# Patient Record
Sex: Male | Born: 1935
Health system: Southern US, Community
[De-identification: ages and names within clinical notes are randomized; demographics above are authoritative.]

## PROBLEM LIST (undated history)

## (undated) DIAGNOSIS — I714 Abdominal aortic aneurysm, without rupture, unspecified: Secondary | ICD-10-CM

## (undated) DIAGNOSIS — G629 Polyneuropathy, unspecified: Secondary | ICD-10-CM

## (undated) DIAGNOSIS — C159 Malignant neoplasm of esophagus, unspecified: Secondary | ICD-10-CM

## (undated) DIAGNOSIS — D509 Iron deficiency anemia, unspecified: Secondary | ICD-10-CM

## (undated) DIAGNOSIS — I2510 Atherosclerotic heart disease of native coronary artery without angina pectoris: Secondary | ICD-10-CM

## (undated) DIAGNOSIS — K635 Polyp of colon: Secondary | ICD-10-CM

## (undated) DIAGNOSIS — E785 Hyperlipidemia, unspecified: Secondary | ICD-10-CM

## (undated) DIAGNOSIS — K219 Gastro-esophageal reflux disease without esophagitis: Secondary | ICD-10-CM

## (undated) DIAGNOSIS — K2270 Barrett's esophagus without dysplasia: Secondary | ICD-10-CM

## (undated) DIAGNOSIS — E669 Obesity, unspecified: Secondary | ICD-10-CM

## (undated) DIAGNOSIS — N189 Chronic kidney disease, unspecified: Secondary | ICD-10-CM

## (undated) DIAGNOSIS — I4892 Unspecified atrial flutter: Secondary | ICD-10-CM

## (undated) DIAGNOSIS — I10 Essential (primary) hypertension: Secondary | ICD-10-CM

## (undated) DIAGNOSIS — H269 Unspecified cataract: Secondary | ICD-10-CM

## (undated) DIAGNOSIS — N400 Enlarged prostate without lower urinary tract symptoms: Secondary | ICD-10-CM

## (undated) HISTORY — PX: CATARACT EXTRACTION: SUR2

## (undated) HISTORY — PX: ARTERIAL ANEURYSM REPAIR: SHX556

## (undated) HISTORY — PX: OTHER SURGICAL HISTORY: SHX169

## (undated) HISTORY — DX: Chronic kidney disease, unspecified: N18.9

## (undated) HISTORY — DX: Unspecified atrial flutter: I48.92

## (undated) HISTORY — DX: Malignant neoplasm of esophagus, unspecified: C15.9

## (undated) HISTORY — DX: Polyneuropathy, unspecified: G62.9

## (undated) HISTORY — DX: Polyp of colon: K63.5

## (undated) HISTORY — DX: Iron deficiency anemia, unspecified: D50.9

## (undated) HISTORY — DX: Atherosclerotic heart disease of native coronary artery without angina pectoris: I25.10

## (undated) HISTORY — DX: Unspecified cataract: H26.9

## (undated) HISTORY — DX: Abdominal aortic aneurysm, without rupture, unspecified: I71.40

## (undated) HISTORY — DX: Abdominal aortic aneurysm, without rupture: I71.4

## (undated) HISTORY — DX: Hyperlipidemia, unspecified: E78.5

## (undated) HISTORY — DX: Gastro-esophageal reflux disease without esophagitis: K21.9

## (undated) HISTORY — DX: Benign prostatic hyperplasia without lower urinary tract symptoms: N40.0

## (undated) HISTORY — DX: Essential (primary) hypertension: I10

## (undated) HISTORY — DX: Obesity, unspecified: E66.9

## (undated) HISTORY — DX: Barrett's esophagus without dysplasia: K22.70

---

## 1978-05-25 HISTORY — PX: LUMBAR DISC ARTHROPLASTY: SHX699

## 1997-12-06 ENCOUNTER — Other Ambulatory Visit: Admission: RE | Admit: 1997-12-06 | Discharge: 1997-12-06 | Payer: Self-pay | Admitting: Urology

## 1998-05-25 HISTORY — PX: ESOPHAGECTOMY: SUR457

## 1998-05-25 HISTORY — PX: CORONARY ARTERY BYPASS GRAFT: SHX141

## 1998-06-24 ENCOUNTER — Other Ambulatory Visit: Admission: RE | Admit: 1998-06-24 | Discharge: 1998-06-24 | Payer: Self-pay | Admitting: Internal Medicine

## 1998-07-08 ENCOUNTER — Encounter: Admission: RE | Admit: 1998-07-08 | Discharge: 1998-10-06 | Payer: Self-pay | Admitting: Endocrinology

## 1998-08-02 ENCOUNTER — Encounter: Payer: Self-pay | Admitting: *Deleted

## 1998-08-03 ENCOUNTER — Inpatient Hospital Stay (HOSPITAL_COMMUNITY): Admission: AD | Admit: 1998-08-03 | Discharge: 1998-08-09 | Payer: Self-pay | Admitting: *Deleted

## 1998-08-07 ENCOUNTER — Encounter: Payer: Self-pay | Admitting: Thoracic Surgery (Cardiothoracic Vascular Surgery)

## 1998-08-08 ENCOUNTER — Encounter: Payer: Self-pay | Admitting: Thoracic Surgery (Cardiothoracic Vascular Surgery)

## 1998-09-04 ENCOUNTER — Ambulatory Visit: Admission: RE | Admit: 1998-09-04 | Discharge: 1998-09-04 | Payer: Self-pay | Admitting: Vascular Surgery

## 1998-09-09 ENCOUNTER — Encounter: Payer: Self-pay | Admitting: Vascular Surgery

## 1998-09-09 ENCOUNTER — Ambulatory Visit (HOSPITAL_COMMUNITY): Admission: RE | Admit: 1998-09-09 | Discharge: 1998-09-09 | Payer: Self-pay | Admitting: Vascular Surgery

## 1998-09-30 ENCOUNTER — Inpatient Hospital Stay: Admission: RE | Admit: 1998-09-30 | Discharge: 1998-10-04 | Payer: Self-pay | Admitting: Vascular Surgery

## 1998-09-30 ENCOUNTER — Encounter: Payer: Self-pay | Admitting: Vascular Surgery

## 1998-10-01 ENCOUNTER — Encounter: Payer: Self-pay | Admitting: Vascular Surgery

## 1998-10-05 ENCOUNTER — Emergency Department (HOSPITAL_COMMUNITY): Admission: EM | Admit: 1998-10-05 | Discharge: 1998-10-05 | Payer: Self-pay | Admitting: Emergency Medicine

## 1998-10-06 ENCOUNTER — Inpatient Hospital Stay (HOSPITAL_COMMUNITY): Admission: EM | Admit: 1998-10-06 | Discharge: 1998-10-08 | Payer: Self-pay | Admitting: Emergency Medicine

## 1998-10-22 ENCOUNTER — Encounter: Payer: Self-pay | Admitting: Vascular Surgery

## 1998-10-22 ENCOUNTER — Inpatient Hospital Stay (HOSPITAL_COMMUNITY): Admission: EM | Admit: 1998-10-22 | Discharge: 1998-10-28 | Payer: Self-pay | Admitting: Vascular Surgery

## 1998-10-25 ENCOUNTER — Encounter: Payer: Self-pay | Admitting: Vascular Surgery

## 1999-01-06 ENCOUNTER — Inpatient Hospital Stay (HOSPITAL_COMMUNITY): Admission: RE | Admit: 1999-01-06 | Discharge: 1999-01-15 | Payer: Self-pay | Admitting: Cardiothoracic Surgery

## 1999-01-06 ENCOUNTER — Encounter: Payer: Self-pay | Admitting: Cardiothoracic Surgery

## 1999-01-07 ENCOUNTER — Encounter: Payer: Self-pay | Admitting: Cardiothoracic Surgery

## 1999-01-08 ENCOUNTER — Encounter: Payer: Self-pay | Admitting: Cardiothoracic Surgery

## 1999-01-10 ENCOUNTER — Encounter: Payer: Self-pay | Admitting: Cardiothoracic Surgery

## 1999-01-13 ENCOUNTER — Encounter: Payer: Self-pay | Admitting: Cardiothoracic Surgery

## 1999-02-25 ENCOUNTER — Encounter (HOSPITAL_COMMUNITY): Admission: RE | Admit: 1999-02-25 | Discharge: 1999-05-26 | Payer: Self-pay | Admitting: *Deleted

## 1999-05-26 HISTORY — PX: VENTRAL HERNIA REPAIR: SHX424

## 1999-05-27 ENCOUNTER — Encounter (HOSPITAL_COMMUNITY): Admission: RE | Admit: 1999-05-27 | Discharge: 1999-06-04 | Payer: Self-pay | Admitting: *Deleted

## 2000-04-01 ENCOUNTER — Encounter: Payer: Self-pay | Admitting: Cardiothoracic Surgery

## 2000-04-01 ENCOUNTER — Encounter: Admission: RE | Admit: 2000-04-01 | Discharge: 2000-04-01 | Payer: Self-pay | Admitting: Cardiothoracic Surgery

## 2000-05-04 ENCOUNTER — Encounter: Payer: Self-pay | Admitting: General Surgery

## 2000-05-08 ENCOUNTER — Inpatient Hospital Stay (HOSPITAL_COMMUNITY): Admission: RE | Admit: 2000-05-08 | Discharge: 2000-05-09 | Payer: Self-pay | Admitting: General Surgery

## 2004-04-03 ENCOUNTER — Ambulatory Visit: Payer: Self-pay | Admitting: Endocrinology

## 2004-12-29 ENCOUNTER — Ambulatory Visit: Payer: Self-pay | Admitting: Endocrinology

## 2005-01-09 ENCOUNTER — Ambulatory Visit: Payer: Self-pay | Admitting: Endocrinology

## 2005-03-05 ENCOUNTER — Ambulatory Visit: Payer: Self-pay | Admitting: Endocrinology

## 2005-11-09 ENCOUNTER — Ambulatory Visit: Payer: Self-pay | Admitting: Endocrinology

## 2005-11-16 ENCOUNTER — Ambulatory Visit: Payer: Self-pay

## 2005-11-30 ENCOUNTER — Encounter: Payer: Self-pay | Admitting: Cardiology

## 2005-11-30 ENCOUNTER — Ambulatory Visit: Payer: Self-pay

## 2005-12-01 ENCOUNTER — Ambulatory Visit: Payer: Self-pay | Admitting: Cardiology

## 2005-12-16 ENCOUNTER — Ambulatory Visit: Payer: Self-pay

## 2005-12-23 ENCOUNTER — Ambulatory Visit: Payer: Self-pay | Admitting: Endocrinology

## 2006-01-11 ENCOUNTER — Ambulatory Visit: Payer: Self-pay | Admitting: Endocrinology

## 2006-03-10 ENCOUNTER — Ambulatory Visit: Payer: Self-pay | Admitting: Endocrinology

## 2006-06-01 ENCOUNTER — Ambulatory Visit: Payer: Self-pay | Admitting: Cardiology

## 2006-08-18 ENCOUNTER — Ambulatory Visit: Payer: Self-pay | Admitting: Endocrinology

## 2006-09-09 ENCOUNTER — Ambulatory Visit (HOSPITAL_COMMUNITY): Admission: RE | Admit: 2006-09-09 | Discharge: 2006-09-09 | Payer: Self-pay | Admitting: Endocrinology

## 2006-11-03 ENCOUNTER — Ambulatory Visit: Payer: Self-pay | Admitting: Cardiology

## 2006-11-30 ENCOUNTER — Ambulatory Visit: Payer: Self-pay

## 2006-11-30 LAB — CONVERTED CEMR LAB
BUN: 10 mg/dL (ref 6–23)
Calcium: 9.4 mg/dL (ref 8.4–10.5)
GFR calc Af Amer: 107 mL/min
GFR calc non Af Amer: 89 mL/min
Potassium: 4.2 meq/L (ref 3.5–5.1)

## 2006-12-24 ENCOUNTER — Encounter: Payer: Self-pay | Admitting: Endocrinology

## 2006-12-24 DIAGNOSIS — I10 Essential (primary) hypertension: Secondary | ICD-10-CM | POA: Insufficient documentation

## 2006-12-24 DIAGNOSIS — E119 Type 2 diabetes mellitus without complications: Secondary | ICD-10-CM | POA: Insufficient documentation

## 2007-01-05 ENCOUNTER — Ambulatory Visit: Payer: Self-pay | Admitting: Endocrinology

## 2007-01-05 LAB — CONVERTED CEMR LAB
Bilirubin Urine: NEGATIVE
Bilirubin, Direct: 0.1 mg/dL (ref 0.0–0.3)
CO2: 32 meq/L (ref 19–32)
Creatinine, Ser: 1 mg/dL (ref 0.4–1.5)
Eosinophils Absolute: 0.2 10*3/uL (ref 0.0–0.6)
Eosinophils Relative: 3.5 % (ref 0.0–5.0)
GFR calc Af Amer: 95 mL/min
Glucose, Bld: 120 mg/dL — ABNORMAL HIGH (ref 70–99)
HDL: 43.5 mg/dL (ref >39.0)
Hemoglobin, Urine: NEGATIVE
Hemoglobin: 13.7 g/dL (ref 13.0–17.0)
Ketones, ur: NEGATIVE mg/dL
Lymphocytes Relative: 32.7 % (ref 12.0–46.0)
MCV: 89.8 fL (ref 78.0–100.0)
Microalb Creat Ratio: 2.2 mg/g (ref 0.0–30.0)
Microalb, Ur: 0.4 mg/dL (ref 0.0–1.9)
Monocytes Absolute: 0.5 10*3/uL (ref 0.2–0.7)
Monocytes Relative: 11.5 % — ABNORMAL HIGH (ref 3.0–11.0)
Neutro Abs: 2.3 10*3/uL (ref 1.4–7.7)
Nitrite: NEGATIVE
Platelets: 196 10*3/uL (ref 150–400)
Potassium: 4.9 meq/L (ref 3.5–5.1)
Sodium: 141 meq/L (ref 135–145)
Testosterone: 308.88 ng/dL — ABNORMAL LOW (ref 350.00–890)
Total Bilirubin: 0.7 mg/dL (ref 0.3–1.2)
Total Protein: 6.7 g/dL (ref 6.0–8.3)
Triglycerides: 55 mg/dL (ref 0–149)
Urine Glucose: NEGATIVE mg/dL
Urobilinogen, UA: 0.2 (ref 0.0–1.0)
WBC: 4.4 10*3/uL — ABNORMAL LOW (ref 4.5–10.5)

## 2007-01-13 ENCOUNTER — Ambulatory Visit: Payer: Self-pay | Admitting: Endocrinology

## 2007-02-21 ENCOUNTER — Ambulatory Visit: Payer: Self-pay | Admitting: Endocrinology

## 2007-04-19 ENCOUNTER — Ambulatory Visit: Payer: Self-pay | Admitting: Endocrinology

## 2007-04-19 DIAGNOSIS — N3000 Acute cystitis without hematuria: Secondary | ICD-10-CM | POA: Insufficient documentation

## 2007-04-25 ENCOUNTER — Telehealth: Payer: Self-pay | Admitting: Endocrinology

## 2007-04-25 LAB — CONVERTED CEMR LAB
ALT: 22 units/L (ref 0–53)
AST: 22 units/L (ref 0–37)
Bacteria, UA: NEGATIVE
Bilirubin Urine: NEGATIVE
Bilirubin, Direct: 0.1 mg/dL (ref 0.0–0.3)
Crystals: NEGATIVE
HDL: 45.5 mg/dL (ref >39.0)
Hemoglobin, Urine: NEGATIVE
Hgb A1c MFr Bld: 6.3 % — ABNORMAL HIGH (ref 4.6–6.0)
Nitrite: NEGATIVE
Total CHOL/HDL Ratio: 2.8
Total Protein, Urine: NEGATIVE mg/dL
Total Protein: 6.6 g/dL (ref 6.0–8.3)
Triglycerides: 73 mg/dL (ref 0–149)
Urine Glucose: NEGATIVE mg/dL
Urobilinogen, UA: 0.2 (ref 0.0–1.0)
VLDL: 15 mg/dL (ref 0–40)

## 2007-04-26 ENCOUNTER — Ambulatory Visit: Payer: Self-pay | Admitting: Endocrinology

## 2007-04-26 DIAGNOSIS — E299 Testicular dysfunction, unspecified: Secondary | ICD-10-CM | POA: Insufficient documentation

## 2007-04-29 ENCOUNTER — Telehealth (INDEPENDENT_AMBULATORY_CARE_PROVIDER_SITE_OTHER): Payer: Self-pay | Admitting: *Deleted

## 2007-08-23 ENCOUNTER — Ambulatory Visit: Payer: Self-pay | Admitting: Endocrinology

## 2007-08-23 ENCOUNTER — Encounter (INDEPENDENT_AMBULATORY_CARE_PROVIDER_SITE_OTHER): Payer: Self-pay | Admitting: *Deleted

## 2007-08-23 DIAGNOSIS — R1319 Other dysphagia: Secondary | ICD-10-CM | POA: Insufficient documentation

## 2007-08-23 DIAGNOSIS — R0602 Shortness of breath: Secondary | ICD-10-CM | POA: Insufficient documentation

## 2007-08-30 ENCOUNTER — Ambulatory Visit: Payer: Self-pay | Admitting: Internal Medicine

## 2007-09-20 ENCOUNTER — Ambulatory Visit: Payer: Self-pay | Admitting: Internal Medicine

## 2007-10-25 ENCOUNTER — Ambulatory Visit: Payer: Self-pay | Admitting: Cardiology

## 2007-12-26 ENCOUNTER — Telehealth: Payer: Self-pay | Admitting: Endocrinology

## 2008-01-09 ENCOUNTER — Ambulatory Visit: Payer: Self-pay | Admitting: Endocrinology

## 2008-01-09 LAB — CONVERTED CEMR LAB
Basophils Absolute: 0 10*3/uL (ref 0.0–0.1)
Bilirubin Urine: NEGATIVE
Bilirubin, Direct: 0.2 mg/dL (ref 0.0–0.3)
CO2: 30 meq/L (ref 19–32)
Calcium: 8.9 mg/dL (ref 8.4–10.5)
Crystals: NEGATIVE
GFR calc Af Amer: 95 mL/min
HDL: 42.2 mg/dL (ref >39.0)
Hemoglobin: 12.4 g/dL — ABNORMAL LOW (ref 13.0–17.0)
Ketones, ur: NEGATIVE mg/dL
LDL Cholesterol: 71 mg/dL (ref 0–99)
Lymphocytes Relative: 28.4 % (ref 12.0–46.0)
MCHC: 33.8 g/dL (ref 30.0–36.0)
Neutro Abs: 2.3 10*3/uL (ref 1.4–7.7)
Platelets: 176 10*3/uL (ref 150–400)
RDW: 15 % — ABNORMAL HIGH (ref 11.5–14.6)
Sodium: 142 meq/L (ref 135–145)
TSH: 2.2 microintl units/mL (ref 0.35–5.50)
Total Bilirubin: 0.8 mg/dL (ref 0.3–1.2)
Total CHOL/HDL Ratio: 3.1
Triglycerides: 83 mg/dL (ref 0–149)
Urobilinogen, UA: 0.2 (ref 0.0–1.0)
VLDL: 17 mg/dL (ref 0–40)
pH: 7 (ref 5.0–8.0)

## 2008-01-16 ENCOUNTER — Telehealth (INDEPENDENT_AMBULATORY_CARE_PROVIDER_SITE_OTHER): Payer: Self-pay | Admitting: *Deleted

## 2008-01-16 ENCOUNTER — Ambulatory Visit: Payer: Self-pay | Admitting: Endocrinology

## 2008-01-16 DIAGNOSIS — D509 Iron deficiency anemia, unspecified: Secondary | ICD-10-CM | POA: Insufficient documentation

## 2008-01-18 LAB — CONVERTED CEMR LAB
Eosinophils Relative: 1.9 % (ref 0.0–5.0)
HCT: 37.3 % — ABNORMAL LOW (ref 39.0–52.0)
Hemoglobin: 12.5 g/dL — ABNORMAL LOW (ref 13.0–17.0)
Hgb A1c MFr Bld: 6.3 % — ABNORMAL HIGH (ref 4.6–6.0)
Monocytes Absolute: 0.5 10*3/uL (ref 0.1–1.0)
Monocytes Relative: 10.9 % (ref 3.0–12.0)
Neutro Abs: 2.8 10*3/uL (ref 1.4–7.7)
Saturation Ratios: 9.1 % — ABNORMAL LOW (ref 20.0–50.0)
WBC: 4.9 10*3/uL (ref 4.5–10.5)

## 2008-02-20 ENCOUNTER — Telehealth (INDEPENDENT_AMBULATORY_CARE_PROVIDER_SITE_OTHER): Payer: Self-pay | Admitting: *Deleted

## 2008-02-21 ENCOUNTER — Ambulatory Visit: Payer: Self-pay | Admitting: Endocrinology

## 2008-05-01 ENCOUNTER — Ambulatory Visit: Payer: Self-pay | Admitting: Endocrinology

## 2008-05-24 ENCOUNTER — Telehealth (INDEPENDENT_AMBULATORY_CARE_PROVIDER_SITE_OTHER): Payer: Self-pay | Admitting: *Deleted

## 2008-08-31 ENCOUNTER — Telehealth (INDEPENDENT_AMBULATORY_CARE_PROVIDER_SITE_OTHER): Payer: Self-pay | Admitting: *Deleted

## 2008-09-06 ENCOUNTER — Ambulatory Visit: Payer: Self-pay | Admitting: Endocrinology

## 2008-09-06 ENCOUNTER — Encounter (INDEPENDENT_AMBULATORY_CARE_PROVIDER_SITE_OTHER): Payer: Self-pay | Admitting: Surgery

## 2008-09-06 ENCOUNTER — Ambulatory Visit: Payer: Self-pay | Admitting: Internal Medicine

## 2008-09-06 ENCOUNTER — Inpatient Hospital Stay (HOSPITAL_COMMUNITY): Admission: AD | Admit: 2008-09-06 | Discharge: 2008-09-08 | Payer: Self-pay | Admitting: Endocrinology

## 2008-09-06 DIAGNOSIS — L03319 Cellulitis of trunk, unspecified: Secondary | ICD-10-CM

## 2008-09-06 DIAGNOSIS — L02219 Cutaneous abscess of trunk, unspecified: Secondary | ICD-10-CM | POA: Insufficient documentation

## 2008-09-10 ENCOUNTER — Telehealth: Payer: Self-pay | Admitting: Endocrinology

## 2008-09-11 ENCOUNTER — Encounter: Payer: Self-pay | Admitting: Endocrinology

## 2008-09-18 ENCOUNTER — Ambulatory Visit: Payer: Self-pay | Admitting: Endocrinology

## 2008-09-19 ENCOUNTER — Telehealth: Payer: Self-pay | Admitting: Endocrinology

## 2008-09-25 ENCOUNTER — Encounter: Payer: Self-pay | Admitting: Endocrinology

## 2008-10-01 ENCOUNTER — Ambulatory Visit: Payer: Self-pay | Admitting: Endocrinology

## 2008-10-17 ENCOUNTER — Ambulatory Visit: Payer: Self-pay | Admitting: Internal Medicine

## 2008-10-17 DIAGNOSIS — R21 Rash and other nonspecific skin eruption: Secondary | ICD-10-CM | POA: Insufficient documentation

## 2008-11-12 ENCOUNTER — Ambulatory Visit: Payer: Self-pay | Admitting: Endocrinology

## 2008-11-14 ENCOUNTER — Telehealth: Payer: Self-pay | Admitting: Endocrinology

## 2009-01-14 ENCOUNTER — Ambulatory Visit: Payer: Self-pay | Admitting: Internal Medicine

## 2009-01-18 LAB — CONVERTED CEMR LAB
ALT: 22 units/L (ref 0–53)
AST: 21 units/L (ref 0–37)
Alkaline Phosphatase: 60 units/L (ref 39–117)
BUN: 18 mg/dL (ref 6–23)
Bilirubin Urine: NEGATIVE
Bilirubin, Direct: 0.1 mg/dL (ref 0.0–0.3)
Cholesterol: 117 mg/dL (ref 0–200)
Eosinophils Relative: 2.2 % (ref 0.0–5.0)
GFR calc non Af Amer: 77.9 mL/min (ref >60)
HCT: 37.6 % — ABNORMAL LOW (ref 39.0–52.0)
Iron: 44 ug/dL (ref 42–165)
Ketones, ur: NEGATIVE mg/dL
LDL Cholesterol: 57 mg/dL (ref 0–99)
Lymphs Abs: 1.3 10*3/uL (ref 0.7–4.0)
Monocytes Relative: 9.3 % (ref 3.0–12.0)
PSA: 2.16 ng/mL (ref 0.10–4.00)
Platelets: 155 10*3/uL (ref 150.0–400.0)
Potassium: 4 meq/L (ref 3.5–5.1)
Sodium: 140 meq/L (ref 135–145)
TSH: 1.85 microintl units/mL (ref 0.35–5.50)
Total Bilirubin: 0.9 mg/dL (ref 0.3–1.2)
Total CHOL/HDL Ratio: 2
Total Protein, Urine: NEGATIVE mg/dL
Urine Glucose: NEGATIVE mg/dL
WBC: 4.8 10*3/uL (ref 4.5–10.5)
pH: 7.5 (ref 5.0–8.0)

## 2009-02-20 ENCOUNTER — Ambulatory Visit: Payer: Self-pay | Admitting: Endocrinology

## 2009-04-12 ENCOUNTER — Encounter: Payer: Self-pay | Admitting: Endocrinology

## 2009-04-15 ENCOUNTER — Encounter: Admission: RE | Admit: 2009-04-15 | Discharge: 2009-04-15 | Payer: Self-pay | Admitting: Surgery

## 2009-04-24 ENCOUNTER — Encounter: Payer: Self-pay | Admitting: Cardiology

## 2009-04-24 ENCOUNTER — Encounter: Payer: Self-pay | Admitting: Endocrinology

## 2009-05-28 ENCOUNTER — Encounter: Payer: Self-pay | Admitting: Internal Medicine

## 2009-05-30 ENCOUNTER — Ambulatory Visit: Payer: Self-pay | Admitting: Cardiology

## 2009-05-30 DIAGNOSIS — I2510 Atherosclerotic heart disease of native coronary artery without angina pectoris: Secondary | ICD-10-CM | POA: Insufficient documentation

## 2009-05-30 DIAGNOSIS — Z9189 Other specified personal risk factors, not elsewhere classified: Secondary | ICD-10-CM | POA: Insufficient documentation

## 2009-07-12 ENCOUNTER — Telehealth: Payer: Self-pay | Admitting: Endocrinology

## 2009-07-15 ENCOUNTER — Ambulatory Visit: Payer: Self-pay | Admitting: Internal Medicine

## 2009-07-16 LAB — CONVERTED CEMR LAB: Hgb A1c MFr Bld: 6.2 % (ref 4.6–6.5)

## 2009-11-20 ENCOUNTER — Ambulatory Visit: Payer: Self-pay | Admitting: Internal Medicine

## 2010-01-01 ENCOUNTER — Ambulatory Visit: Payer: Self-pay | Admitting: Endocrinology

## 2010-01-01 DIAGNOSIS — E7800 Pure hypercholesterolemia, unspecified: Secondary | ICD-10-CM | POA: Insufficient documentation

## 2010-01-01 LAB — CONVERTED CEMR LAB
ALT: 22 units/L (ref 0–53)
Alkaline Phosphatase: 59 units/L (ref 39–117)
Basophils Relative: 0.7 % (ref 0.0–3.0)
Bilirubin, Direct: 0.2 mg/dL (ref 0.0–0.3)
Chloride: 99 meq/L (ref 96–112)
Cholesterol: 111 mg/dL (ref 0–200)
Creatinine, Ser: 0.9 mg/dL (ref 0.4–1.5)
Eosinophils Relative: 1.7 % (ref 0.0–5.0)
HDL: 39.7 mg/dL (ref >39.00)
Hemoglobin: 13.4 g/dL (ref 13.0–17.0)
Iron: 78 ug/dL (ref 42–165)
MCV: 91 fL (ref 78.0–100.0)
Monocytes Absolute: 0.5 10*3/uL (ref 0.1–1.0)
Neutrophils Relative %: 59.2 % (ref 43.0–77.0)
Nitrite: NEGATIVE
Potassium: 4.7 meq/L (ref 3.5–5.1)
RBC: 4.33 M/uL (ref 4.22–5.81)
Total Protein, Urine: NEGATIVE mg/dL
Total Protein: 6.5 g/dL (ref 6.0–8.3)
Transferrin: 285.6 mg/dL (ref 212.0–360.0)
VLDL: 16.8 mg/dL (ref 0.0–40.0)
WBC: 4.8 10*3/uL (ref 4.5–10.5)
pH: 7 (ref 5.0–8.0)

## 2010-01-15 ENCOUNTER — Encounter: Payer: Self-pay | Admitting: Endocrinology

## 2010-01-15 ENCOUNTER — Ambulatory Visit: Payer: Self-pay | Admitting: Endocrinology

## 2010-02-21 ENCOUNTER — Ambulatory Visit: Payer: Self-pay | Admitting: Endocrinology

## 2010-06-26 NOTE — Letter (Signed)
Summary: Dr Corliss Skains Note  Dr Corliss Skains Note   Imported By: Kassie Mends 06/27/2009 10:56:45  _____________________________________________________________________  External Attachment:    Type:   Image     Comment:   External Document

## 2010-06-26 NOTE — Assessment & Plan Note (Signed)
Summary: multiple episodes of low blood sugar-lb   Vital Signs:  Patient profile:   77 year old male Height:      72 inches (182.88 cm) Weight:      228.13 pounds (103.70 kg) BMI:     31.05 O2 Sat:      94 % on Room air Temp:     96.8 degrees F (36 degrees C) oral Pulse rate:   67 / minute BP sitting:   102 / 60  (left arm) Cuff size:   large  Vitals Entered By: Brenton Grills MA (January 01, 2010 10:49 AM)  O2 Flow:  Room air CC: Low CBG's/pt is no longer taking Doxycycline/aj Is Patient Diabetic? Yes   Referring Provider:  Wilmon Arms. Tsuei Primary Provider:  Minus Breeding MD  CC:  Low CBG's/pt is no longer taking Doxycycline/aj.  History of Present Illness: the status of at least 3 ongoing medical problems is addressed today: dm:  he brings a record of his cbg's which i have reviewed today.  it is often in the 50's in am.  he thinks this may have been precip by abx given at dentist a few weeks ago.  with these cbg's, he has mild confusion.   edema:  mild, but persists. dyslipidemia:  denies weight loss. anemia:  denies brbpr  Current Medications (verified): 1)  Aspirin 325 Mg  Tbec (Aspirin) .... Take 1 By Mouth Qd 2)  Multivitamins   Tabs (Multiple Vitamin) .... Take 1 By Mouth Qd 3)  Doxazosin Mesylate 4 Mg  Tabs (Doxazosin Mesylate) .... Take 1 By Mouth Once Daily 4)  Furosemide 20 Mg  Tabs (Furosemide) .... Take 1 By Mouth Once Daily 5)  Simvastatin 40 Mg  Tabs (Simvastatin) .... Take 1 By Mouth Once Daily 6)  Actos 30 Mg Tabs (Pioglitazone Hcl) .Marland Kitchen.. 1po Once Daily 7)  Omeprazole 20 Mg Cpdr (Omeprazole) .... Take 2 By Mouth Once Daily 8)  Diovan 40 Mg Tabs (Valsartan) .Marland Kitchen.. 1 By Mouth Once Daily 9)  Freestyle Lite Test  Strp (Glucose Blood) .... Use Once Daily 250.00, and Lancets 10)  Doxycycline Hyclate 100 Mg Caps (Doxycycline Hyclate) .Marland Kitchen.. 1 By Mouth Two Times A Day  Allergies (verified): 1)  ! Darvocet  Past History:  Past Medical History: Last updated:  05/30/2009 Diabetes mellitus, type II Hypertension Smoker (Quit 1999) BPH Dyslipidemia AAA Hearing loss Esophageal cancer Coronary artery disease status post CABG in 2000   (LIMA to the LAD, SVG to OM, SVG to PDA, last perfusion study in 2007   with no high risk findings)  Review of Systems  The patient denies dyspnea on exertion and abdominal pain.    Physical Exam  General:  normal appearance.   Extremities:  1+ right pedal edema and 1+ left pedal edema.   Additional Exam:  Hemoglobin A1C            6.3 %    Impression & Recommendations:  Problem # 1:  DIABETES MELLITUS, TYPE II (ICD-250.00) well-controlled  Problem # 2:  edema prob due to actos  Problem # 3:  HYPERCHOLESTEROLEMIA (ICD-272.0) well-controlled  Problem # 4:  ANEMIA, IRON DEFICIENCY (ICD-280.9) Assessment: Improved  Other Orders: TLB-Lipid Panel (80061-LIPID) TLB-BMP (Basic Metabolic Panel-BMET) (80048-METABOL) TLB-CBC Platelet - w/Differential (85025-CBCD) TLB-Hepatic/Liver Function Pnl (80076-HEPATIC) TLB-TSH (Thyroid Stimulating Hormone) (84443-TSH) TLB-A1C / Hgb A1C (Glycohemoglobin) (83036-A1C) TLB-Microalbumin/Creat Ratio, Urine (82043-MALB) TLB-Udip w/ Micro (81001-URINE) TLB-IBC Pnl (Iron/FE;Transferrin) (83550-IBC) TLB-PSA (Prostate Specific Antigen) (84153-PSA) Diabetic Clinic Referral (Diabetic) Est.  Patient Level IV (42595)  Patient Instructions: 1)  blood tests are being ordered for you today.  please call [phone] to hear your test results. 2)  pending the test results, stop actos. 3)  refer dietician.  you will be called with a day and time for an appointment. 4)  physical appointment as scheduled. 5)  (update: i left message on phone-tree:  rx as we discussed)

## 2010-06-26 NOTE — Assessment & Plan Note (Signed)
Summary: FLU SHOT/NWS   Nurse Visit   Allergies: 1)  ! Darvocet  Orders Added: 1)  Flu Vaccine 59yrs + MEDICARE PATIENTS [Q2039] 2)  Administration Flu vaccine - MCR [G0008]       Flu Vaccine Consent Questions     Do you have a history of severe allergic reactions to this vaccine? no    Any prior history of allergic reactions to egg and/or gelatin? no    Do you have a sensitivity to the preservative Thimersol? no    Do you have a past history of Guillan-Barre Syndrome? no    Do you currently have an acute febrile illness? no    Have you ever had a severe reaction to latex? no    Vaccine information given and explained to patient? yes    Are you currently pregnant? no    Lot Number:[id number]   Exp Date:11/22/2010   Site Given  Left Deltoid IMu

## 2010-06-26 NOTE — Assessment & Plan Note (Signed)
Summary: DR SAE PT/NOT IN [facility] PROBLEM/INFECTION???--STC   Vital Signs:  Patient profile:   77 year old male Height:      72 inches Weight:      228.25 pounds BMI:     31.07 O2 Sat:      95 % on Room air Temp:     98.2 degrees F oral Pulse rate:   67 / minute BP sitting:   100 / 60  (left arm) Cuff size:   large  Vitals Entered By: Zella Ball Ewing CMA Duncan Dull) (November 20, 2009 1:54 PM)  O2 Flow:  Room air CC: Stomach problems, infection/RE   Primary Care Provider:  Minus Breeding MD  CC:  Stomach problems and infection/RE.  History of Present Illness: here to f/u - abd problem started 16 mo ago had infection to abd wall tx per [place] srugury;  then 8 mo ago started with recurring small areas of infection near the most caudal aspect of the vertical incision;  today with 2 to 3 days onset similar recurrence it seems of red, swelling , pain without drainage or fever or chills  Pt denies CP, sob, doe, wheezing, orthopnea, pnd, worsening LE edema, palps, dizziness or syncope   Pt denies new neuro symptoms such as headache, facial or extremity weakness   Pt denies polydipsia, polyuria, or low sugar symptoms such as shakiness improved with eating.   Problems Prior to Update: 1)  Unspec Personal Hx Presenting Hazards Health  (ICD-V15.9) 2)  Coronary Atherosclerosis Native Coronary Artery  (ICD-414.01) 3)  Preventive Health Care  (ICD-V70.0) 4)  Rash-nonvesicular  (ICD-782.1) 5)  Cellulitis and Abscess of Trunk  (ICD-682.2) 6)  Routine General Medical Exam@health  Care Facl  (ICD-V70.0) 7)  Anemia, Iron Deficiency  (ICD-280.9) 8)  Other Dysphagia  (ICD-787.29) 9)  Shortness of Breath  (ICD-786.05) 10)  Unspecified Testicular Dysfunction  (ICD-257.9) 11)  Acute Cystitis  (ICD-595.0) 12)  Encounter For Long-term Use of Other Medications  (ICD-V58.69) 13)  Hypertension  (ICD-401.9) 14)  Diabetes Mellitus, Type II  (ICD-250.00)  Medications Prior to Update: 1)  Aspirin 325 Mg   Tbec (Aspirin) .... Take 1 By Mouth Qd 2)  Multivitamins   Tabs (Multiple Vitamin) .... Take 1 By Mouth Qd 3)  Doxazosin Mesylate 4 Mg  Tabs (Doxazosin Mesylate) .... Take 1 By Mouth Once Daily 4)  Furosemide 20 Mg  Tabs (Furosemide) .... Take 1 By Mouth Once Daily 5)  Simvastatin 40 Mg  Tabs (Simvastatin) .... Take 1 By Mouth Once Daily 6)  Actos 30 Mg Tabs (Pioglitazone Hcl) .Marland Kitchen.. 1po Once Daily 7)  Omeprazole 20 Mg Cpdr (Omeprazole) .... Take 2 By Mouth Once Daily 8)  Diovan 40 Mg Tabs (Valsartan) .Marland Kitchen.. 1 By Mouth Once Daily 9)  Freestyle Lite Test  Strp (Glucose Blood) .... Use Once Daily 250.00, and Lancets  Current Medications (verified): 1)  Aspirin 325 Mg  Tbec (Aspirin) .... Take 1 By Mouth Qd 2)  Multivitamins   Tabs (Multiple Vitamin) .... Take 1 By Mouth Qd 3)  Doxazosin Mesylate 4 Mg  Tabs (Doxazosin Mesylate) .... Take 1 By Mouth Once Daily 4)  Furosemide 20 Mg  Tabs (Furosemide) .... Take 1 By Mouth Once Daily 5)  Simvastatin 40 Mg  Tabs (Simvastatin) .... Take 1 By Mouth Once Daily 6)  Actos 30 Mg Tabs (Pioglitazone Hcl) .Marland Kitchen.. 1po Once Daily 7)  Omeprazole 20 Mg Cpdr (Omeprazole) .... Take 2 By Mouth Once Daily 8)  Diovan 40 Mg Tabs (Valsartan) .Marland KitchenMarland KitchenMarland Kitchen  1 By Mouth Once Daily 9)  Freestyle Lite Test  Strp (Glucose Blood) .... Use Once Daily 250.00, and Lancets 10)  Doxycycline Hyclate 100 Mg Caps (Doxycycline Hyclate) .Marland Kitchen.. 1 By Mouth Two Times A Day  Allergies (verified): 1)  ! Darvocet  Past History:  Past Medical History: Last updated: 05/30/2009 Diabetes mellitus, type II Hypertension Smoker (Quit 1999) BPH Dyslipidemia AAA Hearing loss Esophageal cancer Coronary artery disease status post CABG in 2000   (LIMA to the LAD, SVG to OM, SVG to PDA, last perfusion study in 2007   with no high risk findings)  Past Surgical History: Last updated: 12/24/2006 Coronary artery bypass graft (2000) Repair ventral hernia (2001) Esophagetomy (2000)  Social History: Last  updated: 01/14/2009 married retired - Administrator, arts 2 sons Former Smoker Alcohol use-yes - 1/2 glass wine per day  Risk Factors: Smoking Status: quit (01/14/2009)  Review of Systems       all otherwise negative per pt -    Physical Exam  General:  alert and overweight-appearing.   Head:  normocephalic and atraumatic.   Eyes:  vision grossly intact, pupils equal, and pupils round.   Ears:  R ear normal and L ear normal.   Nose:  no external deformity and no nasal discharge.   Mouth:  no gingival abnormalities and pharynx pink and moist.   Neck:  supple and no masses.   Lungs:  normal respiratory effort and normal breath sounds.   Heart:  normal rate and regular rhythm.   Abdomen:  soft, non-tender, and normal bowel sounds.   Extremities:  no edema, no erythema  Skin:  upper abd wall/epigastric area with 2 small areas erythema, sweling , tender without fluctuance or drainage   Impression & Recommendations:  Problem # 1:  CELLULITIS AND ABSCESS OF TRUNK (ICD-682.2)  His updated medication list for this problem includes:    Doxycycline Hyclate 100 Mg Caps (Doxycycline hyclate) .Marland Kitchen... 1 by mouth two times a day treat as above, f/u any worsening signs or symptoms , consider ID consult and/ro surg f/u with Dr Gwinda Passe  Problem # 2:  HYPERTENSION (ICD-401.9)  His updated medication list for this problem includes:    Doxazosin Mesylate 4 Mg Tabs (Doxazosin mesylate) .Marland Kitchen... Take 1 by mouth once daily    Furosemide 20 Mg Tabs (Furosemide) .Marland Kitchen... Take 1 by mouth once daily    Diovan 40 Mg Tabs (Valsartan) .Marland Kitchen... 1 by mouth once daily  BP today: 100/60 Prior BP: 132/51 (05/30/2009)  Labs Reviewed: K+: 4.0 (01/14/2009) Creat: : 1.0 (01/14/2009)   Chol: 117 (01/14/2009)   HDL: 47.10 (01/14/2009)   LDL: 57 (01/14/2009)   TG: 66.0 (01/14/2009) stable overall by hx and exam, ok to continue meds/tx as is   Problem # 3:  DIABETES MELLITUS, TYPE II (ICD-250.00)  His  updated medication list for this problem includes:    Aspirin 325 Mg Tbec (Aspirin) .Marland Kitchen... Take 1 by mouth qd    Actos 30 Mg Tabs (Pioglitazone hcl) .Marland Kitchen... 1po once daily    Diovan 40 Mg Tabs (Valsartan) .Marland Kitchen... 1 by mouth once daily  Labs Reviewed: Creat: 1.0 (01/14/2009)    Reviewed HgBA1c results: 6.2 (07/15/2009)  6.0 (01/14/2009) stable overall by hx and exam, ok to continue meds/tx as is   Complete Medication List: 1)  Aspirin 325 Mg Tbec (Aspirin) .... Take 1 by mouth qd 2)  Multivitamins Tabs (Multiple vitamin) .... Take 1 by mouth qd 3)  Doxazosin Mesylate 4 Mg Tabs (  Doxazosin mesylate) .... Take 1 by mouth once daily 4)  Furosemide 20 Mg Tabs (Furosemide) .... Take 1 by mouth once daily 5)  Simvastatin 40 Mg Tabs (Simvastatin) .... Take 1 by mouth once daily 6)  Actos 30 Mg Tabs (Pioglitazone hcl) .Marland Kitchen.. 1po once daily 7)  Omeprazole 20 Mg Cpdr (Omeprazole) .... Take 2 by mouth once daily 8)  Diovan 40 Mg Tabs (Valsartan) .Marland Kitchen.. 1 by mouth once daily 9)  Freestyle Lite Test Strp (Glucose blood) .... Use once daily 250.00, and lancets 10)  Doxycycline Hyclate 100 Mg Caps (Doxycycline hyclate) .Marland Kitchen.. 1 by mouth two times a day  Patient Instructions: 1)  Please take all new medications as prescribed 2)  Continue all previous medications as before this visit  3)  Please schedule an appointment with your primary doctor as needed Prescriptions: DOXYCYCLINE HYCLATE 100 MG CAPS (DOXYCYCLINE HYCLATE) 1 by mouth two times a day  #28 x 0   Entered and Authorized by:   Corwin Levins MD   Signed by:   Corwin Levins MD on 11/20/2009   Method used:   Print then Give to Patient   RxID:   [id number]

## 2010-06-26 NOTE — Consult Note (Signed)
Summary: [facility]  [facility]   Imported By: Lester Etna 05/31/2009 09:32:50  _____________________________________________________________________  External Attachment:    Type:   Image     Comment:   External Document

## 2010-06-26 NOTE — Assessment & Plan Note (Signed)
Summary: SUR      Allergies Added:   Visit Type:  Follow-up Referring Provider:  Wilmon Arms. Tsuei Primary Provider:  Minus Breeding MD  CC:  CAD.  History of Present Illness: The patient presents for followup of his known coronary disease. He is also to have surgical debridement of a nonhealing abdominal wound. This has been draining intermittently for some time. Dr. Corliss Skains is planning to open this under apparently local anesthetic. The patient has been doing extremely well. He exercises at a 12% grade on a treadmill 3 times per week. With this he denies any chest discomfort, neck or arm discomfort. He denies any shortness of breath and has no PND or orthopnea. He has no palpitations, presyncope or syncope.  Current Medications (verified): 1)  Aspirin 325 Mg  Tbec (Aspirin) .... Take 1 By Mouth Qd 2)  Multivitamins   Tabs (Multiple Vitamin) .... Take 1 By Mouth Qd 3)  Doxazosin Mesylate 4 Mg  Tabs (Doxazosin Mesylate) .... Take 1 By Mouth Once Daily 4)  Furosemide 20 Mg  Tabs (Furosemide) .... Take 1 By Mouth Once Daily 5)  Simvastatin 40 Mg  Tabs (Simvastatin) .... Take 1 By Mouth Once Daily 6)  Actos 30 Mg Tabs (Pioglitazone Hcl) .Marland Kitchen.. 1po Once Daily 7)  Omeprazole 20 Mg Cpdr (Omeprazole) .... Take 2 By Mouth Once Daily 8)  Diovan 40 Mg Tabs (Valsartan) .Marland Kitchen.. 1 By Mouth Once Daily 9)  Freestyle Lite Test  Strp (Glucose Blood) .... Use Once Daily 250.00, and Lancets  Allergies (verified): 1)  ! Darvocet  Past History:  Past Medical History: Diabetes mellitus, type II Hypertension Smoker (Quit 1999) BPH Dyslipidemia AAA Hearing loss Esophageal cancer Coronary artery disease status post CABG in 2000   (LIMA to the LAD, SVG to OM, SVG to PDA, last perfusion study in 2007   with no high risk findings)  Past Surgical History: Reviewed history from 12/24/2006 and no changes required. Coronary artery bypass graft (2000) Repair ventral hernia (2001) Esophagetomy (2000)  Review  of Systems       As stated in the HPI and negative for all other systems.   Vital Signs:  Patient profile:   77 year old male Height:      72 inches Weight:      224 pounds BMI:     30.49 Pulse rate:   65 / minute Resp:     16 per minute BP sitting:   132 / 51  (right arm)  Vitals Entered By: Marrion Coy, CNA (May 30, 2009 10:38 AM)  Physical Exam  General:  Well developed, well nourished, in no acute distress. Head:  normocephalic and atraumatic Eyes:  PERRLA/EOM intact; conjunctiva and lids normal. Mouth:  Teeth, gums and palate normal. Oral mucosa normal. Neck:  Neck supple, no JVD. No masses, thyromegaly or abnormal cervical nodes. Chest Wall:  well-healed sternotomy scar Lungs:  Clear bilaterally to auscultation and percussion. Heart:  Non-displaced PMI, chest non-tender; regular rate and rhythm, S1, S2 without murmurs, rubs or gallops. Carotid upstroke normal, no bruit. Normal abdominal aortic size, no bruits. Femorals normal pulses, no bruits. Pedals normal pulses. No edema, no varicosities. Abdomen:  Bowel sounds positive; abdomen soft and non-tender without masses, organomegaly, or hernias noted. No hepatosplenomegaly, abdominal wall with a small nondraining eschar in the cephalad area of his previous scar Msk:  Back normal, normal gait. Muscle strength and tone normal. Extremities:  No clubbing or cyanosis. Neurologic:  Alert and oriented x  3. Skin:  Intact without lesions or rashes. Psych:  Normal affect.   EKG  Procedure date:  05/30/2009  Findings:      sinus rhythm, rate 65, axis rightward, RSR prime V1, intervals within normal limits, no acute ST-T wave changes.  Impression & Recommendations:  Problem # 1:  CORONARY ATHEROSCLEROSIS NATIVE CORONARY ARTERY (ICD-414.01) The patient has done well. He had a stress perfusion study last in 2007 but had no high risk findings. He has a very high functional level. He is going for a low risk procedure. He would  be at acceptable risk for this procedure. He needs no further cardiovascular testing and will continue with aggressive risk reduction. Orders: EKG w/ Interpretation (93000)  Problem # 2:  HYPERTENSION (ICD-401.9) His blood pressure is controlled and he will continue the meds as listed.  Problem # 3:  UNSPEC PERSONAL HX PRESENTING HAZARDS HEALTH (ICD-V15.9) I review his lipids from August. Triglycerides 66, HDL 47, LDL 57. This is excellent. He will continue on the current lipid regimen. Of note his hemoglobin A1c was 6.0.  Orders: EKG w/ Interpretation (93000)  Patient Instructions: 1)  Your physician recommends that you schedule a follow-up appointment in: 18 MONTHS WITH DR [facility] 2)  Your physician recommends that you continue on your current medications as directed. Please refer to the Current Medication list given to you today.

## 2010-06-26 NOTE — Assessment & Plan Note (Signed)
Summary: YEARLY FU/ MEDICARE/ TO COME FASTING/ NWS  #   Vital Signs:  Patient profile:   77 year old male Height:      72 inches (182.88 cm) Weight:      228 pounds (103.64 kg) BMI:     31.03 O2 Sat:      95 % on Room air Temp:     97.0 degrees F (36.11 degrees C) oral Pulse rate:   56 / minute BP sitting:   110 / 60  (left arm) Cuff size:   large  Vitals Entered By: Brenton Grills MA (January 15, 2010 8:27 AM)  O2 Flow:  Room air CC: Yearly/Medicare/aj Is Patient Diabetic? Yes   Referring Provider:  Wilmon Arms. Tsuei Primary Provider:  Minus Breeding MD  CC:  Yearly/Medicare/aj.  History of Present Illness: here for regular wellness examination.  He's feeling pretty well in general, and does not smoke.   Current Medications (verified): 1)  Aspirin 325 Mg  Tbec (Aspirin) .... Take 1 By Mouth Qd 2)  Multivitamins   Tabs (Multiple Vitamin) .... Take 1 By Mouth Qd 3)  Doxazosin Mesylate 4 Mg  Tabs (Doxazosin Mesylate) .... Take 1 By Mouth Once Daily 4)  Furosemide 20 Mg  Tabs (Furosemide) .... Take 1 By Mouth Once Daily 5)  Simvastatin 40 Mg  Tabs (Simvastatin) .... Take 1 By Mouth Once Daily 6)  Omeprazole 20 Mg Cpdr (Omeprazole) .... Take 2 By Mouth Once Daily 7)  Diovan 40 Mg Tabs (Valsartan) .Marland Kitchen.. 1 By Mouth Once Daily 8)  Freestyle Lite Test  Strp (Glucose Blood) .... Use Once Daily 250.00, and Lancets  Allergies (verified): 1)  ! Darvocet  Past History:  Past Medical History: Last updated: 05/30/2009 Diabetes mellitus, type II Hypertension Smoker (Quit 1999) BPH Dyslipidemia AAA Hearing loss Esophageal cancer Coronary artery disease status post CABG in 2000   (LIMA to the LAD, SVG to OM, SVG to PDA, last perfusion study in 2007   with no high risk findings)  Family History: Reviewed history from 01/16/2008 and no changes required. father had lymphoma sister: uncertain type of cancer mother had cva at age 36  Social History: Reviewed history from  01/14/2009 and no changes required. 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    Physical Exam  General:  normal appearance.   Head:  head: no deformity eyes: no periorbital swelling, no proptosis external nose and ears are normal mouth: no lesion seen Neck:  Supple without thyroid enlargement or tenderness.  Chest Wall:  healed midline scar Lungs:  Clear to auscultation bilaterally. Normal respiratory effort.  Heart:  Regular rate and rhythm without murmurs or gallops noted. Normal S1,S2.   Abdomen:  abdomen is soft, nontender.  no hepatosplenomegaly.   not distended.  no hernia midline scar, with slight drainage at superior aspect Rectal:  normal external and internal exam.  heme neg  Prostate:  Normal size prostate without masses or tenderness.  Msk:  muscle bulk and strength are grossly normal.  no obvious joint swelling.  gait is normal and steady  Pulses:  dorsalis pedis intact bilat.  no carotid bruit  Extremities:  no deformity.  no ulcer on the  feet.  feet are of normal color and temp.   trace right pedal edema and trace left pedal edema. there are bilat leg healed vein harvest scars   Neurologic:  cn 2-12 grossly intact.   readily moves all 4's.   sensation is intact to touch on the feet, but decreased from normal Skin:  normal texture and temp.  no rash.  not diaphoretic  Cervical Nodes:  No significant adenopathy.  Psych:  Alert and cooperative; normal mood and affect; normal attention span and concentration.     Impression & Recommendations:  Problem # 1:  ROUTINE GENERAL MEDICAL EXAM@HEALTH   CARE FACL (ICD-V70.0)  Other Orders: EKG w/ Interpretation (93000) Est. Patient 65& > (16109)   Patient Instructions: 1)  please consider these measures for your health:  minimize alcohol.  do not use tobacco products.  have a colonoscopy at least every 10 years from age 39.  keep firearms safely stored.  always use seat belts.  have working smoke alarms in your home.  see an eye doctor and dentist regularly.  never drive under the influence of alcohol or drugs (including prescription drugs).  those with fair skin should take precautions against the sun. 2)  please let me know what your wishes would be, if artificial life support measures should become necessary.  it is critically important to prevent falling down (keep floor areas well-lit, dry, and free of loose objects) 3)  (update:  we discussed code status.  pt requests full code, but would not want to be started or maintained on artificial life-support measures if there was not a reasonable chance of recovery) Prescriptions: FREESTYLE LITE TEST  STRP (GLUCOSE BLOOD) use once daily 250.00, and lancets  #100 x 11   Entered and Authorized by:   Minus Breeding MD   Signed by:   Minus Breeding MD on 01/15/2010   Method used:   Electronically to        [facility] [phone]* (retail)       [address]       [place], [place]  [id number]       Ph: [phone] or [phone]       Fax: [phone]   RxID:   [id number] DIOVAN 40 MG TABS (VALSARTAN) 1 by mouth once daily  #90 x 3   Entered and Authorized by:   Minus Breeding MD   Signed by:   Minus Breeding MD on 01/15/2010   Method used:   Electronically to        [facility] [phone]* (retail)       [address]       [place], [place]  [id number]       Ph: [phone] or [phone]       Fax: [phone]   RxID:   [id number] OMEPRAZOLE 20 MG CPDR (OMEPRAZOLE) TAKE 2 by mouth once daily  #180 Capsule x  3   Entered and Authorized by:   Minus Breeding MD   Signed by:   Minus Breeding MD on 01/15/2010   Method used:   Electronically to        [facility] [phone]* (retail)       [address]       [place], [place]  [address]  Ph: [phone] or [phone]       Fax: [phone]   RxID:   [id number] SIMVASTATIN 40 MG  TABS (SIMVASTATIN) TAKE 1 by mouth once daily  #90 Tablet x 3   Entered and Authorized by:   Minus Breeding MD   Signed by:   Minus Breeding MD on 01/15/2010   Method used:   Electronically to        [facility] [phone]* (retail)       [address]       [place], [place]  [address]       Ph: [phone] or [phone]       Fax: [phone]   RxID:   [id number] FUROSEMIDE 20 MG  TABS (FUROSEMIDE) take 1 by mouth once daily  #90 Tablet x 3   Entered and Authorized by:   Minus Breeding MD   Signed by:   Minus Breeding MD on 01/15/2010   Method used:   [facility]ronically [facility]        [facility] [phone]* (retail)       [address]       [place], [place]  [address]       Ph: [phone] or [phone]       Fax: [phone]   RxID:   [id number] DOXAZOSIN MESYLATE 4 MG  TABS (DOXAZOSIN MESYLATE) take 1 by mouth once daily  #90 Tablet x 3   Entered and Authorized by:   Minus Breeding MD   Signed by:   Minus Breeding MD on 01/15/2010   Method used:   [facility]ronically [facility]        [facility] [phone]* (retail)       [address]       [place], [place]  [address]       Ph: [phone] or [phone]       Fax: [phone]   RxID:   [id number]

## 2010-06-26 NOTE — Progress Notes (Signed)
Summary: REQ FOR Labs  Phone Note Call from Patient   Summary of Call: Patient is requesting order for labs.  Initial call taken by: Lamar Sprinkles, CMA,  July 12, 2009 9:33 AM  Follow-up for Phone Call        a1c 250.00 Follow-up by: Minus Breeding MD,  July 12, 2009 9:35 AM  Additional Follow-up for Phone Call Additional follow up Details #1::        left message on machine to call back to office. Additional Follow-up by: Lucious Groves,  July 12, 2009 2:36 PM    Additional Follow-up for Phone Call Additional follow up Details #2::    DONE. Follow-up by: Lucious Groves,  July 12, 2009 5:02 PM

## 2010-07-08 ENCOUNTER — Telehealth: Payer: Self-pay | Admitting: Endocrinology

## 2010-07-09 ENCOUNTER — Encounter: Payer: Self-pay | Admitting: Endocrinology

## 2010-07-11 ENCOUNTER — Ambulatory Visit: Payer: Self-pay | Admitting: Endocrinology

## 2010-07-16 ENCOUNTER — Other Ambulatory Visit: Payer: Self-pay | Admitting: Endocrinology

## 2010-07-16 ENCOUNTER — Encounter: Payer: Self-pay | Admitting: Endocrinology

## 2010-07-16 ENCOUNTER — Ambulatory Visit (INDEPENDENT_AMBULATORY_CARE_PROVIDER_SITE_OTHER): Payer: Managed Care, Other (non HMO) | Admitting: Endocrinology

## 2010-07-16 ENCOUNTER — Other Ambulatory Visit: Payer: Managed Care, Other (non HMO)

## 2010-07-16 ENCOUNTER — Encounter (INDEPENDENT_AMBULATORY_CARE_PROVIDER_SITE_OTHER): Payer: Self-pay | Admitting: *Deleted

## 2010-07-16 DIAGNOSIS — E119 Type 2 diabetes mellitus without complications: Secondary | ICD-10-CM

## 2010-07-16 LAB — HEMOGLOBIN A1C: Hgb A1c MFr Bld: 6.5 % (ref 4.6–6.5)

## 2010-07-16 NOTE — Progress Notes (Signed)
Summary: Diabetic Shoes form  Phone Note Call from Patient   Summary of Call: Patient left vm. [employer] faxed form to be completed for his diabetic insoles & shoes. Please call pt to inform when complete.  Initial call taken by: Lamar Sprinkles, CMA,  July 08, 2010 1:38 PM

## 2010-07-21 ENCOUNTER — Encounter: Payer: Self-pay | Admitting: Endocrinology

## 2010-07-22 NOTE — Letter (Signed)
Summary: Diabetic verification/[facility]  Diabetic verification/[facility]   Imported By: Lester Patillas 07/14/2010 10:01:21  _____________________________________________________________________  External Attachment:    Type:   Image     Comment:   External Document

## 2010-07-22 NOTE — Assessment & Plan Note (Signed)
Summary: f/u appt   Vital Signs:  Patient profile:   77 year old male Height:      72 inches (182.88 cm) Weight:      223.25 pounds (101.48 kg) BMI:     30.39 O2 Sat:      96 % on Room air Temp:     97.8 degrees F (36.56 degrees C) oral Pulse rate:   75 / minute Pulse rhythm:   regular BP sitting:   126 / 62  (left arm) Cuff size:   large  Vitals Entered By: Brenton Grills CMA Duncan Dull) (July 16, 2010 10:25 AM)  O2 Flow:  Room air CC: Follow-up visit/aj Is Patient Diabetic? Yes   Referring Provider:  Wilmon Arms. Tsuei Primary Provider:  Minus Breeding MD  CC:  Follow-up visit/aj.  History of Present Illness: pt has been off the actos x 6 months.  no cbg record, but states cbg's are well-controlled.  pt states he feels well in general.  edema is impoved  Current Medications (verified): 1)  Aspirin 325 Mg  Tbec (Aspirin) .... Take 1 By Mouth Qd 2)  Multivitamins   Tabs (Multiple Vitamin) .... Take 1 By Mouth Qd 3)  Doxazosin Mesylate 4 Mg  Tabs (Doxazosin Mesylate) .... Take 1 By Mouth Once Daily 4)  Furosemide 20 Mg  Tabs (Furosemide) .... Take 1 By Mouth Once Daily 5)  Simvastatin 40 Mg  Tabs (Simvastatin) .... Take 1 By Mouth Once Daily 6)  Omeprazole 20 Mg Cpdr (Omeprazole) .... Take 2 By Mouth Once Daily 7)  Diovan 40 Mg Tabs (Valsartan) .Marland Kitchen.. 1 By Mouth Once Daily 8)  Freestyle Lite Test  Strp (Glucose Blood) .... Use Once Daily 250.00, and Lancets  Allergies (verified): 1)  ! Darvocet  Past History:  Past Medical History: Last updated: 05/30/2009 Diabetes mellitus, type II Hypertension Smoker (Quit 1999) BPH Dyslipidemia AAA Hearing loss Esophageal cancer Coronary artery disease status post CABG in 2000   (LIMA to the LAD, SVG to OM, SVG to PDA, last perfusion study in 2007   with no high risk findings)  Review of Systems       The patient complains of weight gain.  The patient denies weight loss.    Physical Exam  General:  normal appearance.     Pulses:  dorsalis pedis intact bilat.   Extremities:  no deformity.  no ulcer on the feet.  feet are of normal color and temp.   trace right pedal edema and trace left pedal edema. there are bilat leg healed vein harvest scars   mycotic toenails.   Neurologic:  sensation is intact to touch on the feet, but decreased from normal Additional Exam:  Hemoglobin A1C            6.5 %     Impression & Recommendations:  Problem # 1:  DIABETES MELLITUS, TYPE II (ICD-250.00) well-controlled off actos  Other Orders: TLB-A1C / Hgb A1C (Glycohemoglobin) (83036-A1C) Est. Patient Level III (16109)  Patient Instructions: 1)  blood tests are being ordered for you today.  please call [phone] to hear your test results. 2)  Please schedule a "medicare wellness" appointment in 6 months. 3)  (update: i left message on phone-tree:  rx as we discussed)   Orders Added: 1)  TLB-A1C / Hgb A1C (Glycohemoglobin) [83036-A1C] 2)  Est. Patient Level III [81191]

## 2010-07-31 NOTE — Letter (Signed)
Summary: [facility]   Imported By: Sherian Rein 07/23/2010 13:43:18  _____________________________________________________________________  External Attachment:    Type:   Image     Comment:   External Document

## 2010-08-04 ENCOUNTER — Encounter: Payer: Self-pay | Admitting: Endocrinology

## 2010-08-11 ENCOUNTER — Encounter (INDEPENDENT_AMBULATORY_CARE_PROVIDER_SITE_OTHER): Payer: Self-pay | Admitting: *Deleted

## 2010-08-12 NOTE — Letter (Signed)
Summary: [facility]  [facility]   Imported By: Lennie Odor 08/07/2010 14:01:23  _____________________________________________________________________  External Attachment:    Type:   Image     Comment:   External Document

## 2010-08-21 NOTE — Letter (Signed)
Summary: Appointment - Reminder 2  [facility], Main Office  [address]   [place], [place] [address]   Phone: [phone]  Fax: [phone]     August 11, 2010 MRN: [id number]     [facility]   [address]   [place], [place].  [address]   Dear Mr. Neider,  Our records indicate that it is time to schedule a follow-up appointment.  Dr.Taylor recommended that you follow up with [place] in April. It is very important that we reach you to schedule this appointment. We look forward to participating in your health care needs. Please contact us at the number listed above at your earliest convenience to schedule your appointment.  If you are unable to make an appointment at this time, give [place] a call so we can update our records.     Sincerely,   Glass blower/designer

## 2010-09-03 LAB — COMPREHENSIVE METABOLIC PANEL
ALT: 18 U/L (ref 0–53)
AST: 21 U/L (ref 0–37)
Albumin: 3.6 g/dL (ref 3.5–5.2)
Alkaline Phosphatase: 56 U/L (ref 39–117)
Chloride: 103 mEq/L (ref 96–112)
GFR calc Af Amer: >60 mL/min (ref >60)
Potassium: 4.1 mEq/L (ref 3.5–5.1)
Sodium: 138 mEq/L (ref 135–145)
Total Bilirubin: 0.5 mg/dL (ref 0.3–1.2)

## 2010-09-03 LAB — URINE MICROSCOPIC-ADD ON

## 2010-09-03 LAB — CBC
Platelets: 158 10*3/uL (ref 150–400)
WBC: 5.2 10*3/uL (ref 4.0–10.5)

## 2010-09-03 LAB — URINALYSIS, ROUTINE W REFLEX MICROSCOPIC
Bilirubin Urine: NEGATIVE
Nitrite: NEGATIVE
Specific Gravity, Urine: 1.008 (ref 1.005–1.030)
Urobilinogen, UA: 0.2 mg/dL (ref 0.0–1.0)

## 2010-09-03 LAB — ANAEROBIC CULTURE

## 2010-09-03 LAB — CULTURE, ROUTINE-ABSCESS

## 2010-09-03 LAB — CULTURE, BLOOD (ROUTINE X 2): Culture: NO GROWTH

## 2010-09-03 LAB — GLUCOSE, CAPILLARY
Glucose-Capillary: 82 mg/dL (ref 70–99)
Glucose-Capillary: 90 mg/dL (ref 70–99)
Glucose-Capillary: 98 mg/dL (ref 70–99)

## 2010-09-03 LAB — HEMOGLOBIN A1C: Mean Plasma Glucose: 120 mg/dL

## 2010-09-11 ENCOUNTER — Telehealth: Payer: Self-pay

## 2010-09-11 NOTE — Telephone Encounter (Signed)
Pt has already scheduled an appt for Monday April 23

## 2010-09-11 NOTE — Telephone Encounter (Signed)
Pt called requesting a mild sleep aid. Pt states he has ben having trouble falling asleep, worse when travelling. Pt also states that he has been noticing a decrease in energy level over the last couple years and is requesting to have his testosterone check to see if he may be a candidate for testosterone replacement. Please advise.

## 2010-09-11 NOTE — Telephone Encounter (Signed)
Ov today or tomorrow 

## 2010-09-15 ENCOUNTER — Ambulatory Visit (INDEPENDENT_AMBULATORY_CARE_PROVIDER_SITE_OTHER): Payer: Medicare HMO | Admitting: Endocrinology

## 2010-09-15 ENCOUNTER — Other Ambulatory Visit (INDEPENDENT_AMBULATORY_CARE_PROVIDER_SITE_OTHER): Payer: Medicare HMO

## 2010-09-15 ENCOUNTER — Encounter: Payer: Self-pay | Admitting: Endocrinology

## 2010-09-15 VITALS — BP 122/70 | HR 70 | Temp 98.2°F | Ht 72.0 in | Wt 223.0 lb

## 2010-09-15 DIAGNOSIS — E299 Testicular dysfunction, unspecified: Secondary | ICD-10-CM

## 2010-09-15 DIAGNOSIS — N400 Enlarged prostate without lower urinary tract symptoms: Secondary | ICD-10-CM | POA: Insufficient documentation

## 2010-09-15 LAB — TESTOSTERONE: Testosterone: 178.19 ng/dL — ABNORMAL LOW (ref 350.00–890.00)

## 2010-09-15 MED ORDER — TRAZODONE HCL 100 MG PO TABS: 100.0000 mg | ORAL_TABLET | Freq: Every day | ORAL | Status: DC

## 2010-09-15 MED ORDER — TESTOSTERONE 50 MG/5GM (1%) TD GEL: 5.0000 g | Freq: Every day | TRANSDERMAL | Status: DC

## 2010-09-15 NOTE — Progress Notes (Signed)
  Subjective:    Patient ID: Gerald Martinez, male    DOB: 06/05/1933, 77 y.o.   MRN: [id number]  HPI In 2008, he was found to have hypogonadism.  He has never been on rx for this.  He has fatigue. Ed: Denies decreased urinary stream. Pt says he has few years of insomnia.  He is unable to cite precip factor.   Past Medical History  Diagnosis Date  . Diabetes mellitus     Type II  . Hypertension   . BPH (benign prostatic hypertrophy)   . Dyslipidemia   . AAA (abdominal aortic aneurysm)   . Hearing loss   . Esophageal cancer   . CAD (coronary artery disease)     Post CABG in 2000.  LOMA to the LAD, SVG to OM, SVG to PDA, last perfusion study in 2007 with no high risk findings   Past Surgical History  Procedure Date  . Coronary artery bypass graft 2000  . Esophagectomy 2000  . Ventral hernia repair 2001    reports that he quit smoking about 13 years ago. He does not have any smokeless tobacco history on file. He reports that he does not drink alcohol. His drug history not on file. family history includes Cancer in his father and sister; Heart disease in his mother; and Stroke in his mother. Allergies  Allergen Reactions  . Propoxyphene N-Acetaminophen     Review of Systems Denies anxiety and depression.    Objective:   Physical Exam GENERAL: no distress Genitalia:  Normal male Alert and oriented x 3.  Does not appear anxious nor depressed.     Lab Results  Component Value Date   TESTOSTERONE 178.19* 09/15/2010      Assessment & Plan:  Hypogonadism, needs increased rx Ed, needs increased rx Insomnia, needs increased rx

## 2010-09-15 NOTE — Patient Instructions (Addendum)
blood tests are being ordered for you today.  please call [phone] to hear your test results. pending the test results, here are prescriptions for the testosterone and sleep. Please make a follow-up appointment in 1 month. (update: i left message on phone-tree:  rx as we discussed)

## 2010-09-24 ENCOUNTER — Telehealth: Payer: Self-pay

## 2010-09-24 DIAGNOSIS — E299 Testicular dysfunction, unspecified: Secondary | ICD-10-CM

## 2010-09-24 NOTE — Telephone Encounter (Signed)
Pt called stating he does not feel an increase in energy levels since starting Testosterone supplement x 8 days. Please advise?

## 2010-09-24 NOTE — Telephone Encounter (Signed)
come in for testosterone level. i ordered

## 2010-09-25 ENCOUNTER — Other Ambulatory Visit (INDEPENDENT_AMBULATORY_CARE_PROVIDER_SITE_OTHER): Payer: Medicare HMO

## 2010-09-25 DIAGNOSIS — E299 Testicular dysfunction, unspecified: Secondary | ICD-10-CM

## 2010-09-25 LAB — TESTOSTERONE: Testosterone: 300.28 ng/dL — ABNORMAL LOW (ref 350.00–890.00)

## 2010-09-25 NOTE — Telephone Encounter (Signed)
Pt advised.

## 2010-09-25 NOTE — Telephone Encounter (Signed)
Called pt's home phone, answers to a fax machine.

## 2010-10-07 NOTE — Assessment & Plan Note (Signed)
[facility]                         GASTROENTEROLOGY OFFICE NOTE   Gerald Martinez                        MRN:          [id number]  DATE:08/30/2007                            DOB:          05/09/1934    REFERRING PHYSICIAN:  Gregary Signs A. Gerald All, MD   PHYSICIAN REQUESTING CONSULTATION:  Gerald A. Gerald All, MD   REASON FOR CONSULTATION:  Dysphagia.   HISTORY:  This is a pleasant, complicated 77 year old white male with a  history of coronary artery disease status post coronary artery bypass  grafting, abdominal aortic aneurysm status post repair, history of  ventral hernia status post repair, chronic reflux disease with a history  of Barrett esophagus, and superficial cancer for which he underwent  esophagectomy in 2000.  He also has a history of hypertension and  dyslipidemia.  In terms of his cardiovascular disease, hypertension and  lipid disorder, he has been stable.  He presents now regarding  dysphagia.  He notices dysphagia to solid items two to three times  weekly over the past several weeks, possibly longer.  It does not appear  that he has had endoscopy since his initial surgery.  He denies  heartburn or indigestion.  He has had no vomiting.  No abdominal pain,  change in bowel habits or rectal bleeding.  The patient did undergo  colonoscopy in 2004.  He was found to have diverticulosis and one colon  polyp.  He does have a history of adenomatous colon polyps.  Follow up  in five years recommended.  It should also be mentioned that the patient  has diabetes which has been stable.   PAST MEDICAL HISTORY:  As outlined above in the HPI.   ALLERGIES:  DARVOCET.   CURRENT MEDICATIONS:  1. Aspirin 325 mg.  2. Multivitamin.  3. Actos 15 mg daily.  4. Doxazosin 4 mg daily.  5. Vitamin C.  6. Diovan 40 mg daily.  7. Simvastatin 40 mg daily.   FAMILY HISTORY:  Brother and sister with colon polyps.  Mother and  father with heart disease.   Father and uncle with diabetes.   SOCIAL HISTORY:  The patient is married with two sons.  Lives with his  wife.  Does not smoke.  Drinks wine.   REVIEW OF SYSTEMS:  Per diagnostic evaluation form.   PHYSICAL EXAMINATION:  Well-appearing male in no acute distress.  He is  alert and oriented times three.  His blood pressure is 124/70, the heart rate is 60 and regular,  respirations are 18.  His weight is 226.2 pounds.  He is 6 feet, 1 inch  in height.  HEENT:  Sclerae are anicteric.  Conjunctivae are pink.  The oral mucosa  is intact.  There is no adenopathy.  Thyroid is normal.  LUNGS:  Clear to auscultation and percussion.  HEART:  Regular without murmur.  ABDOMEN:  Soft without tenderness or mass.  Small umbilical hernia.  Good bowel sounds heard, no organomegaly.  EXTREMITIES:  Without edema.  NEUROLOGIC: No focal deficit   IMPRESSION:  1. Intermittent solid food dysphagia likely due to anastomotic  stricture.  2. History of adenocarcinoma status post esophagectomy.  3. Previous history of reflux disease complicated by Barrett      esophagus.  4. History of adenomatous colon polyps.  5. Incidental diverticulosis.  6. Diabetes mellitus, stable.  7. Coronary artery disease, stable.   RECOMMENDATIONS:  1. Upper endoscopy with possible esophageal dilation.  The nature of      the procedure as well as the risks, benefits and alternatives have      been reviewed.  He understood and agreed to proceed.  2. Colonoscopy for routine polyp surveillance.  The nature of the      procedure as well as the risks, benefits and alternatives were      reviewed.  He understood and agreed to proceed.  3. Hold oral diabetic agents the day of his exam to avoid      hypoglycemia.  4. Ongoing general medical care with Dr. Everardo Martinez.     Wilhemina Bonito. Marina Goodell, MD  Electronically Signed    JNP/MedQ  DD: 08/30/2007  DT: 08/30/2007  Job #: [id number]   cc:   Gerald Signs A. Gerald All, MD

## 2010-10-07 NOTE — Assessment & Plan Note (Signed)
[facility]                            CARDIOLOGY OFFICE NOTE   DACE, DENN                        MRN:          [id number]  DATE:10/25/2007                            DOB:          26-Apr-1934    PRIMARY CARE PHYSICIAN:  Dr. Romero Belling.   REASON FOR PRESENTATION:  Evaluate patient with coronary disease.   HISTORY OF PRESENT ILLNESS:  The patient presents for follow-up of the  above.  He is now 77 years old.  He has done well since the last time I  saw him.  He denies any ongoing chest discomfort, neck or arm  discomfort.  He has had no palpitations, presyncope or syncope.  He  denies any PND or orthopnea.  He has not been exercising as much as he  should.   PAST MEDICAL HISTORY:  Coronary artery disease status post CABG in 2000  (LIMA to the LAD, SVG to OM, SVG to PDA, last perfusion study in 2007  with no high risk findings), abdominal aortic aneurysm status post  repair in 2000, total esophagectomy secondary to adenocarcinoma in 2000,  Barrett's esophagus, benign prostatic hypertrophy, disk surgery, ear  surgery in the 1950s, gastroesophageal reflux disease, dyslipidemia,  previous tobacco abuse.   ALLERGIES:  DARVOCET.   MEDICATIONS:  1. Aspirin 325 mg daily.  2. Multivitamin.  3. Actos 15 mg daily.  4. Doxazosin 4 mg daily.  5. Vitamin C.  6. Diovan 40 mg daily.  7. Simvastatin 40 mg daily.  8. Omeprazole 20 mg b.i.d.   REVIEW OF SYSTEMS:  As stated in the HPI, and, otherwise, negative for  other systems.   PHYSICAL EXAMINATION:  GENERAL:  The patient is in no distress.  VITAL SIGNS:  Blood pressure 132/80, heart rate 60 and regular, weight  225 pounds.  HEENT:  Eyes unremarkable, pupils equal, round, reactive to light, fundi  not visualized, oral mucosa unremarkable.  NECK:  No jugular venous distention at 45 degrees, carotid upstroke  brisk and symmetric.  BACK:  No costovertebral tenderness.  LUNGS:  Clear to  auscultation bilaterally.  CHEST:  Well-healed sternotomy scar.  HEART:  PMI not displaced or sustained, S1 and S2 within normal limits,  no S3, no S4, no clicks, no rubs, no murmurs.  ABDOMEN:  Obese, well-healed midline surgical scar, no rebound, no  guarding, no hepatomegaly, no splenomegaly.  SKIN:  No rashes, no  nodules.  EXTREMITIES:  2+ pulses throughout, no edema, no cyanosis, no clubbing.  NEUROLOGICAL:  Oriented to place, time, time.  Cranial nerves II-XII  grossly intact, motor grossly intact.   EKG:  Sinus rhythm, rate 60, axis within normal limits, intervals within  normal limits, no acute ST wave changes.   ASSESSMENT/PLAN:  1. Coronary disease.  The patient has no new symptoms.  No further      cardiovascular testing is suggested.  He will continue with risk      reduction.  2. Dyslipidemia.  He had lipid profile last year.  I will defer any      follow-up to Dr. Everardo All.  He will continue medications as listed.  3. Obesity.  We discussed this at length.  I gave him specific      instructions on a diet and recommended the [employer].  I      gave him specific instructions on exercise, and hopefully he will      comply with this.  4. Peripheral vascular disease.  He is up-to-date on his carotids, and      has had abdominal aneurysm repair.  No follow-up is needed.  5. Mildly reduced ejection fraction.  The last echocardiogram showed      his ejection fraction to be about 45%.  I would not suspect any      change.  He has had no new symptoms.  He will continue with the      meds as listed.     Rollene Rotunda, MD, [facility]  Electronically Signed    JH/MedQ  DD: 10/25/2007  DT: 10/25/2007  Job #: [id number]

## 2010-10-07 NOTE — Assessment & Plan Note (Signed)
[facility]                            CARDIOLOGY OFFICE NOTE   Gerald Martinez, Gerald Martinez                        MRN:          [id number]  DATE:11/03/2006                            DOB:          11-21-1933    PRIMARY CARE PHYSICIAN:  Dr. Romero Belling.   REASON FOR PRESENTATION:  Evaluate patient with coronary disease.   HISTORY OF PRESENT ILLNESS:  Patient returns for six-month followup.  When I saw him in January, he had had some dyspnea, which was managed.  He has not had any of this since then.  He has been exercising.  He goes  to the [facility].  He does 30 minutes a day on the treadmill and seems to  have an excellent regimen.  He has no chest discomfort, neck or arm  discomfort with this.  He has had no palpitations, presyncope or  syncope.  He denies any PND or orthopnea.  Unfortunately, with his  exercise, he has not lost weight.  He admits to eating too much.  Of  note, he had his Diovan stopped because of some low blood pressures.  He  does not report he is particularly symptomatic with this.   PAST MEDICAL HISTORY:  Coronary artery disease, status post CABG in  2000, (LIMA to the LAD, SVG to OM, SVG to PA), abdominal aortic  aneurysm, status post repair in 2000, total esophagectomy with  adenocarcinoma in 2000, Barrett's esophagus, benign prostatic  hypertrophy, disc surgery, ear surgery in the 1950s, gastroesophageal  reflux disease, hyperlipidemia, previous tobacco abuse.   ALLERGIES:  DARVOCET.   MEDICATIONS:  1. Aspirin 325 mg daily.  2. Multivitamin.  3. Actos 15 mg daily.  4. Doxazosin 40 mg daily.  5. Lipitor 40 mg daily.  6. Vitamin C.   REVIEW OF SYSTEMS:  As stated in the HPI, and otherwise negative for  other systems.   PHYSICAL EXAMINATION:  The patient is in no distress.  Blood pressure  138/80, heart rate 65 and regular.  Weight 218 pounds.  Body mass index  29.  HEENT:  Eyelids unremarkable.  Pupils equal, round and  reactive to  light.  Fundi not visualized.  Oral mucosa unremarkable.  NECK:  No jugular venous distention at 45 degrees.  Carotid upstroke  brisk and symmetric.  No bruits, no thyromegaly.  LYMPHATICS:  No cervical, axillary or inguinal adenopathy.  LUNGS:  Clear to auscultation bilaterally.  BACK:  No costovertebral angle tenderness.  CHEST:  Well-healed sternotomy scar.  HEART:  PMI not displaced or sustained.  S1 and S2 within normal limits.  No S3, no S4, no clicks, no rubs, no murmurs.  ABDOMEN:  Obese, positive bowel sounds, well-healed surgical scar.  No  rebound, no guarding, no hepatomegaly, no splenomegaly.  SKIN:  No rashes, no nodules.  EXTREMITIES:  Two-plus pulses throughout, no edema, no cyanosis or  clubbing.  NEUROLOGIC:  Oriented to person, place and time.  Cranial nerves II  through XII grossly intact.  Motor grossly intact.   EKG:  Sinus rhythm, premature ventricular contractions, axis within  normal limits,  intervals within normal limits, no acute STT-wave  changes.   ASSESSMENT AND PLAN:  1. Coronary disease:  Patient is having no ongoing symptoms.  No      further cardiovascular testing is suggested.  He will continue with      secondary risk reduction.  2. Mildly reduced ejection fraction:  Patient did have an EF of 40-      50%.  He has diabetes.  I would like to try to restart him on      Diovan 40 mg daily.  He knows to get a BMET in one week after      starting this.  3. Obesity:  We discussed ways of losing weight.  I have suggested      that he not eat bread.  4. Peripheral vascular disease:  The patient is up to date on his      carotids and had no coronary disease.  He has had an abdominal      aneurysm repair and no suggestion that he needs followup of this.      We discussed this and I reviewed his chart at length.  5. Followup:  I will see him back in one year, or sooner, if needed.     Rollene Rotunda, MD, [facility]  Electronically Signed     JH/MedQ  DD: 11/03/2006  DT: 11/03/2006  Job #: [id number]   cc:   Gregary Signs A. Everardo All, MD

## 2010-10-07 NOTE — Consult Note (Signed)
Gerald Martinez, Gerald Martinez NO.:  [id number]   MEDICAL RECORD NO.:  [id number]          PATIENT TYPE:  INP   LOCATION:  1335                         FACILITY:  [facility]   PHYSICIAN:  Wilmon Arms. Corliss Skains, M.D. DATE OF BIRTH:  1934-03-03   DATE OF CONSULTATION:  09/06/2008  DATE OF DISCHARGE:                                 CONSULTATION   REASON FOR CONSULTATION:  Swelling and erythema of the anterior  abdominal wall.   HISTORY OF PRESENT ILLNESS:  This is a 77 year old male who is 9 years  status post incisional hernia repair by Dr. Johna Sheriff.  According to the  operative note, he used Sepramesh, which is a coated polypropylene mesh.  The patient had been doing reasonably well but over the last couple  months he has noted increasing abdominal girth.  He is still having  normal appetite and bowel movements.  Over the last couple days, he has  developed erythema and tenderness in the lobe his incision.  This has  become very tender and he presented to Dr. George Hugh office this morning  for evaluation.  Dr. Revonda Standard has admitted the patient to hospital for  further evaluation and treatment.  The patient's only complaint is that  he is having tenderness in his abdominal wall also feels a little bit  unsteady on his feet.   PAST MEDICAL HISTORY:  1. Diabetes type 2.  2. Hypertension.  3. History of smoking  4. Prostatic hypertrophy.  5. Dyslipidemia.  6. History is not of esophageal cancer.  7. Coronary artery disease.   PAST SURGICAL HISTORY:  Coronary artery bypass grafting, diskectomy,  right ear surgery, AAA repair in 2000, esophagectomy in 2000, ventral  hernia repair in 2001.   CURRENT MEDICATIONS:  Aspirin, multivitamin, doxazosin, Lasix,  simvastatin, Actos, Diovan, omeprazole.   FAMILY HISTORY:  Father is disease with lymphoma.  Mother deceased after  a stroke.   LABS:  White count 5.2, hemoglobin 12.3, platelet count 158,000.  The  patient's electrolytes are  pending.  Blood cultures are also pending.   PHYSICAL EXAMINATION:  VITAL SIGNS:  Temperature 97.3, pulse 54, blood  pressure 137/95, respirations 20, sats 96% on room air.  CONSTITUTIONAL:  This is a well-developed, well-nourished male in no  apparent distress.  HEENT:  EOMI.  Sclerae anicteric.  NECK:  No mass or thyromegaly.  LUNGS:  Clear.  Normal respiratory effort.  HEART:  Regular rate and rhythm.  No murmur.  ABDOMEN:  This a long-healed midline incision.  Positive bowel sounds.  In the middle of the incision is a 5 cm area of dark erythema with some  slight fluctuance.  EXTREMITIES:  No edema.  SKIN:  Warm, dry with no sign of jaundice.   IMPRESSION:  Abdominal wall erythema and swelling.  Possible recurrent  incarcerated hernia versus mesh infection with subcutaneous abscess.   RECOMMENDATIONS:  Would keep the patient n.p.o.  Agree with starting IV  antibiotics empirically.  We will check CT scan of the abdomen and  pelvis to further evaluate his abdominal wall.      Molli Hazard  Kerman Passey, M.D.  Electronically Signed     MKT/MEDQ  D:  09/06/2008  T:  09/06/2008  Job:  [id number]

## 2010-10-07 NOTE — Discharge Summary (Signed)
NAMELEANARD, DIMAIO NO.:  [id number]   MEDICAL RECORD NO.:  [id number]          PATIENT TYPE:  INP   LOCATION:  1335                         FACILITY:  [facility]   PHYSICIAN:  Stacie Glaze, MD    DATE OF BIRTH:  [birth year]   DATE OF ADMISSION:  09/06/2008  DATE OF DISCHARGE:  09/08/2008                               DISCHARGE SUMMARY   ADMISSION DIAGNOSIS:  Abdominal wall abscess.   DISCHARGE DIAGNOSES:  1. Abdominal wall abscess.  2. Diabetes.  3. Hypertension.  4. Hyperlipidemia.  5. History of  esophageal cancer.   HOSPITAL COURSE:  The patient is a 77 year old white male who is nine  years post incisional hernia repair, who has a history of type 2  diabetes, who presented with an abdominal wall cellulitis and abscess  formation to Dr. Gregary Signs A. Ellison's office.  He was subsequently admitted  to the hospital for surgical consultation and appropriate IV  antibiotics.   HOSPITAL COURSE:  He was admitted to the General Medicine Service and  antibiotics were begun with Unasyn IV and he was placed n.p.o.  Appropriate CT scan was ordered and there was a 3 cm subcutaneous  abscess on the anterior abdominal wall.  He underwent incision and  drainage of the abdominal wall abscess on 09/06/2008, and was followed  by general surgery postop on days one and two.  He was stable and  afebrile from that standpoint.  Pain control was adequate.  Surgery  ordered home health care for dressing changes.  He was converted to p.o.  Augmentin and doxycycline, to cover possible methicillin-resistant  Staphylococcus aureus per the recommendation of Dr. Rosalyn Gess. Norins.   He is now discharged to home in an improved condition.   DISCHARGE MEDICATIONS:  1. He will take Augmentin extended release 1000 mg, two p.o. b.i.d.      for 10 days.  2. Doxycycline 100 mg p.o. b.i.d. for 10 days.  He is instructed about      the possible side effects of these medications.   Home health  care has been consulted to provide dressing changes.   FOLLOWUP:  1. He should return to see Dr. Everardo All within one week.  2. He should return to see the surgeon within one week, as requested.      Stacie Glaze, MD  Electronically Signed     JEJ/MEDQ  D:  09/08/2008  T:  09/08/2008  Job:  [id number]

## 2010-10-07 NOTE — Op Note (Signed)
NAMEDONTRELLE, MAZON NO.:  [id number]   MEDICAL RECORD NO.:  [id number]          PATIENT TYPE:  INP   LOCATION:  1335                         [facility]ACILITY[facility]  [facility]   PHYSICIAN:  Wilmon Arms. Corliss Skains, M.D. DATE OF BIRTH:  [birth year]   DATE OF PROCEDURE:  09/06/2008  DATE OF DISCHARGE:                               OPERATIVE REPORT   PREOPERATIVE DIAGNOSIS:  Abdominal wall abscess.   POSTOPERATIVE DIAGNOSIS:  Abdominal wall abscess.   PROCEDURE PERFORMED:  Incision and drainage of abdominal wall abscess.   SURGEON:  Wilmon Arms. Tsuei, M.D.   ANESTHESIA:  General.   INDICATIONS:  The patient is a 77 year old male with multiple previous  surgeries including an open ventral hernia repair with underlay  polypropylene mesh by Dr. Johna Sheriff in 2001.  The patient had been doing  well but over the last couple weeks has noticed some swelling in his  anterior abdomen.  Over the last couple of days, this has become fairly  tender and erythematous.  He was admitted to the hospital today by Dr.  Everardo All who was concerned a subcutaneous infection.  We were consulted  to see the patient.  His white count was normal, and electrolytes within  normal limits.  However, there seemed to be a localized area of  fluctuance.  We obtained a CT scan to rule out any intra-abdominal  infection versus recurrent hernia versus bowel perforation with  fistulization.  This scan showed only a 3-cm subcutaneous abscess with  no other significant findings.   DESCRIPTION OF PROCEDURE:  The patient was brought to the operating room  and placed in supine position on the operating table.  After an adequate  level of general anesthesia was obtained, the patient's abdomen was  shaved and prepped with Betadine and draped in sterile fashion.  A time  out was taken to assure the proper patient and proper procedure.  We  injected the area around the abscess cavity with 0.25% Marcaine with  epinephrine.  I made an  incision directly over the fluctuant area.  We  encountered some foul-smelling purulent fluid immediately.  This was  cultured and sent for microbiology.  We opened the cavity widely.  This  abscess cavity seemed to track superiorly.  We continued opening the  cavity throughout its entire length.  We had debrided the size of the  abscess cavity.  There did not seem to be any further tunneling or  tracking.  All the infection seemed to be anterior to the fascia.  We  thoroughly irrigated the wound with saline and packed it with saline-  soaked gauze.  A dry dressing was applied.  The patient was extubated  and brought to recovery in stable condition.  All sponge, instrument,  and needle counts were correct.      Wilmon Arms. Tsuei, M.D.  Electronically Signed     MKT/MEDQ  D:  09/06/2008  T:  09/06/2008  Job:  [id number]

## 2010-10-10 NOTE — Assessment & Plan Note (Signed)
[facility] HEALTHCARE                              CARDIOLOGY OFFICE NOTE   Gerald Martinez, Gerald Martinez                        MRN:          [id number]  DATE:12/01/2005                            DOB:          April 13, 1934    REFERRING PHYSICIAN:  SEAN A ELLISON   PRIMARY:  Romero Belling, M.D.   REASON FOR REFERRAL:  Patient with dyspnea and questionable pulmonary edema.   HISTORY OF PRESENT ILLNESS:  The patient is a 77 year old gentleman most  recently followed in this clinic by Dr. Gerri Spore.  He has known coronary  disease.  He was referred to me for evaluation of an episode of shortness of  breath.  This happened in May.  He was [place], [place] climbing stairs.  He noticed that at each landing he had to stop for 15 seconds to recover his  breath before going on.  He had not experienced this before, although this  was a very different activity than he had done and probably more strenuous.  He does exercise on a treadmill routinely.  He puts this on an incline and  can walk on this, getting his heart rate into the 120 range at the highest.  With this he has never developed dyspnea out of proportion.  He does not  have any chest discomfort, neck discomfort, arm discomfort, activity-induced  nausea, vomiting or excessive diaphoresis.   He saw Dr. Everardo All and was given a course of diuretics.  He thinks this may  have helped.  He had a chest x-ray which suggested some vascular congestion  but seemed relatively bland with no acute process.  He had a stress  perfusion study demonstrating a well-preserved ejection fraction with no  evidence of ischemia or infarct.  His echocardiogram suggested that his  ejection fraction was probably 45% with hypokinesis of the septal wall.  There was some mildly elevated peak pulmonary pressures.   The patient now reports he is back to baseline.  He is exercising and not  having complaints.   PAST MEDICAL HISTORY:  1.  Coronary  artery disease, status post CABG in 2000 (LIMA to the LAD, SVG      to OM, SVG to PDA).  2.  Abdominal aortic aneurysm, status post repair in 2000.  3.  Total esophagectomy with adenocarcinoma in 2000.  4.  Barrett's esophagus.  5.  Benign prostatic hypertrophy.  6.  Disk surgery.  7.  Right ear surgery in the 1950s.  8.  Gastroesophageal reflux disease.  9.  Hyperlipidemia.  10. Previous tobacco use.   ALLERGIES:  DARVOCET.   MEDICATIONS:  1.  Aspirin 325 mg a day.  2.  Multivitamin.  3.  Actos 15 mg a day.  4.  Diovan 80 mg a day.  5.  Vitamin C.  6.  Doxazosin 4 mg q. day.  7.  Lovastatin 80 mg q.h.s.  8.  Lipitor 80 mg q. day.   SOCIAL HISTORY:  The patient is married.  He has two children.  He is  retired.  He quit smoking in 1999.  FAMILY HISTORY:  Noncontributory.   REVIEW OF SYSTEMS:  As stated in the HPI.  Positive for cramping in his  feet.  Lumbar back pain.  Negative for other symptoms.   PHYSICAL EXAMINATION:  The patient is in no distress.  Blood pressure  112/72, heart rate 61 and regular, weight 209 pounds.  HEENT:  Eyes  unremarkable.  Pupils equal, round and reactive to light.  Fundi not  visualized.  Oral mucosa unremarkable.  NECK:  No jugular venous distention at 45 degrees.  Carotid upstroke brisk  and symmetric.  Questionable slight bruit.  No thyromegaly.  LYMPHATICS:  No cervical, axillary, or inguinal adenopathy.  LUNGS:  Clear to auscultation bilaterally.  BACK:  No CVA tenderness.  CHEST:  Well-healed sternotomy scar.  HEART:  PMI not displaced or sustained.  S1 and S2 within normal limits.  No  S3, no S4, no murmurs.  ABDOMEN:  Flat, positive bowel sounds, normal in frequency and pitch.  No  bruits, no rebound, no guarding.  SKIN:  No rash or nausea.  EXTREMITIES:  2+ pulses throughout.  No edema, no cyanosis, no clubbing.  NEUROLOGIC:  Oriented to person, place and time.  Cranial nerves II through  XII grossly intact.  Motor grossly  intact.   EKG:  Rate 61, sinus rhythm, right axis deviation, no acute ST or T-wave  changes, no change from previous EKGs.   ASSESSMENT AND PLAN:  1.  Shortness of breath.  The patient had an episode of shortness of breath      while climbing an excessive flight of stairs.  I do not know if this was      edema.  He appears to have no ischemia on a Cardiolite.  His ejection      fraction is probably mildly reduced but overall relatively well      preserved.  At this point, I would ascribe this most likely to the      activity rather than angina or edema.  He is going to finish his course      of diuretics and let me know if his dyspnea returns.  We have discussed      cross-training possibly on a stair-stepper to try to train for      activities such as climbing these stairs.  We discussed an exercise      regimen.  At this point, I do not think further cardiovascular testing      is suggested unless he has recurrent dyspnea.  At that point, I would      get a BNP level and maybe another assessment of his ejection fraction      with a MUGA.  He will continue with the medications as listed.  2.  Risk reduction.  I made sure to add a lipid profile to his yearly labs      which are to be done in August.  He is having this followed by Dr.      Everardo All.  His goal is an LDL less than 70 and an HDL in the 40s.  I will      defer to Dr. Everardo All.  3.  Hypertension.  Blood pressure is well controlled.  He will continue the      medications as listed.  4.  Weight.  The patient could stand to lose some weight and we discussed      this in terms of diet and exercise.  5.  Followup.  Will see him  in six months and then probably yearly      thereafter as long as he is not having any problems.                               Rollene Rotunda, MD, [facility]    JH/MedQ  DD:  12/01/2005  DT:  12/01/2005  Job #:  [id number]  cc:   Gregary Signs A. Everardo All, MD

## 2010-10-10 NOTE — Discharge Summary (Signed)
[facility]  Patient:    Gerald Martinez, Gerald Martinez                        MRN: [id number] Adm. Date:  [id number] Disc. Date: [id number] Attending:  Glenna Fellows Martinez CC:         Gerald Martinez, M.D. [facility]  Gerald Martinez. Gerald Martinez, M.D.   Discharge Summary  DISCHARGE DIAGNOSIS:  Ventral incisional hernia.  OPERATIONS AND PROCEDURES:  Open repair of ventral incisional hernia on May 07, 2000.  HISTORY OF PRESENT ILLNESS:  Mr. Gerald Martinez is a 77 year old white male who is followed by Dr. Romero Martinez and Dr. Ofilia Martinez.  Mr. Gerald Martinez was found to have Barretts esophagus on evaluation by Dr. Everardo Martinez about a year ago, and on preoperative workup was found to have severe coronary artery disease and abdominal aortic aneurysm.  The patient has subsequently undergone coronary artery bypass grafting by Dr. Tyrone Martinez, and following this had repair of his abdominal aortic aneurysm, and then underwent an esophagectomy with gastric replacement for an apparent small adenocarcinoma.  He has done extremely well from these procedures.  He is fit and works out in Gerald Martinez [place].  He, however, has developed progressive bulging in his upper abdomen which is uncomfortable, but not painful.  PAST MEDICAL HISTORY:  Significant mainly as above.  Also followed for mild diabetes, oral agent controlled.  Hypercholesterolemia and benign prostatic hypertrophy.  PAST SURGICAL HISTORY:  He had a lumbar laminectomy in 1976.  MEDICATIONS: 1. Lescol 40 mg q.d. 2. Actos 15 mg q.d. 3. Cardura 4 mg q.d. 4. Diovan 80 mg q.d. 5. Bayer aspirin 325 mg q.d.  ALLERGIES:  TYLENOL.  SOCIAL HISTORY:  See detailed H&P.  FAMILY HISTORY:  See detailed H&P.  REVIEW OF SYSTEMS:  See detailed H&P.  PHYSICAL EXAMINATION:  Limited to the abdomen which revealed a long midline upper abdominal incision.  There was a very large upper abdominal incisional hernia running from the xiphoid to the umbilicus,  measuring about 18 x 7 to 8 cm.  HOSPITAL COURSE:  The patient was admitted on the morning of his procedure for incisional hernia repair.  He underwent open repair with a large piece of Prolene mesh.  His postoperative course was unremarkable.  He initially had some expected significant incisional pain controlled with narcotics.  He, however, rapidly improved.  He was started on a clear liquid diet which he tolerated well and was able to be advanced to a regular diet.  By May 09, 2000, he was up and around with adequate pain control on oral medications, tolerating a diet, drains remained in place.  He was discharged home on May 09, 2000.  FOLLOWUP:  In my office in one week.  WOUND CARE:  Wound and drain care was explained prior to discharge.  DISCHARGE MEDICATIONS:  Same as admission, plus Tylox p.r.n. pain. DD:  06/15/00 TD:  06/16/00 Job: [id number]

## 2010-10-10 NOTE — Assessment & Plan Note (Signed)
[facility]                            CARDIOLOGY OFFICE NOTE   DONTREL, SMETHERS                        MRN:          [id number]  DATE:06/01/2006                            DOB:          [birth year]    PRIMARY CARE PHYSICIAN:  Dr. Gregary Signs A. Everardo All.   REASON FOR PRESENTATION:  Evaluate patient with coronary disease and  dyspnea.   HISTORY OF PRESENT ILLNESS:  I saw the patient back in July for dyspnea  that he had experienced while on vacation.  He was treated with a short  course of diuretics.  However, since then he has felt much better.  He  has had more energy.  He has been walking on the treadmill.  He has not  been having any dyspnea.  He has been having no PND or orthopnea.  He  has been having no chest pain.  He has had no palpitations, presyncope,  or syncope.   PAST MEDICAL HISTORY:  1. Coronary artery disease, status post CABG in 2000 (LIMA to the LAD,      SVG to OM, SVG to PA).  2. Abdominal aortic aneurysm status post repair in 2000.  3. Total esophagectomy with adenocarcinoma in 2000.  4. Barrett's esophagus.  5. Benign prostatic hypertrophy.  6. Disk surgery.  7. Ear surgery in the 1950s.  8. Gastroesophageal reflux disease.  9. Hyperlipidemia.  10.Previous tobacco abuse.   ALLERGIES:  DARVOCET.   MEDICATIONS:  1. Aspirin 325 mg daily.  2. Multivitamin.  3. Actos 40 mg daily.  4. Doxazosin 4 mg daily.  5. Lipitor 20 mg daily.   REVIEW OF SYSTEMS:  As stated in the HPI and otherwise negative for  other systems.   PHYSICAL EXAMINATION:  The patient is in no distress.  Blood pressure  145/76, heart rate 61 and regular, weight 216 pounds.  NECK:  No jugular venous distention at 45 degrees, carotid upstroke  brisk and symmetrical.  LUNGS:  Clear to auscultation bilaterally.  HEART:  PMI not displaced or sustained, S1, S2 within normal limits, no  S3, no S4, no murmurs.  ABDOMEN:  Obese, positive bowel sounds normal in  frequency and pitch, no  bruits, no rebound, no guarding, no midline pulsatile mass, no mass or  organomegaly.  SKIN:  No rashes, no nodules.  EXTREMITIES:  There are 2+ pulses, no edema.   1. Shortness of breath, this is improved.  No further cardiovascular      testing is suggested.  2. Obesity, we discussed losing weight as he very interested in this.      I have prescribed the Avala Diet.  3. Followup, I will see him back in 6 months and probably yearly      thereafter.     Rollene Rotunda, MD, [facility]  Electronically Signed    JH/MedQ  DD: 06/01/2006  DT: 06/01/2006  Job #: [id number]

## 2010-10-10 NOTE — Op Note (Signed)
[facility]  Patient:    Gerald Martinez, Gerald Martinez                        MRN: [id number] Proc. Date: 05/07/00 Adm. Date:  [id number] Attending:  Glenna Fellows Tappan                           Operative Report  PREOPERATIVE DIAGNOSIS:  Incisional hernia.  POSTOPERATIVE DIAGNOSIS:  Incisional hernia.  OPERATION:  Repair of incisional hernia with mesh.  SURGEON:  Lorne Skeens. Hoxworth, M.D.  ASSISTANT:  Gita Kudo, M.D.  ANESTHESIA:  General.  INDICATIONS:  [name] is a 77 year old white male who has had several abdominal procedures including a abdominal aortic aneurysm repair and esophagectomy.  He now presents with a large diffuse upper midline incisional hernia from his umbilicus to his xiphoid.  After discussion in the office and discussion of alternatives and risks of surgery including bleeding, infection, recurrence of cardiopulmonary complications we have elected to proceed with repair.  DESCRIPTION OF PROCEDURE:  The patient was brought to the operating room and placed in the supine position on the operating room table, and general endotracheal anesthesia was induced.  Ancef 1 g was given intravenously. PAS were placed.  The orogastric tube and Foley catheter were placed.  The previous upper midline incision from the xiphoid to the umbilicus was excised and dissection carried down to the subcutaneous tissue.  A large hernia sac was entered, and the wound was opened from the xiphoid to the umbilicus. There was a firm discrete edge of the hernia which was quite large with the rectus muscles having retracted laterally.  Adhesions to the anterior abdominal wall which were rather extensive were completely lysed throughout the abdomen.  There had been a previous rent in the omentum allowing the small bowel to come up through this and the small bowel was dissected off the omentum and the omentum closed with a running 3-0 chronic returning  it anterior to the small bowel with good coverage of this.  Following this, the fascial edges were defined in all directions and skin and subcutaneous flaps raised back in all directions for about 6 cm.  The hernia sac was excised from the subcu.  Following this, a large piece of Sepra mesh was trimmed to size which was about a 20 x 18 cm piece of mesh.  This was placed intraperitoneally with the Sepra material from the viscera.  It was then sutured well back from the fascial edges using interrupted mattress sutures of 0 Prolene in all directions.  This affected good wide coverage with the mesh well back from the fascial edges.  Following this, the fascia was reapproximated in the midline superiorly and inferiorly but not in the center where there was too great a tension at which point it was tacked down to the mesh.  The wound was irrigated with antibiotic solution.  Two close suction drains were brought through separate stab wounds.  Further skin and subcu was trimmed back to excess tissue and this was closed with staples.  Sponge and needle counts were correct.  Dry sterile dressings were applied, and the patient was taken to recovery in good condition. DD:  05/07/00 TD:  05/08/00 Job: [id number]

## 2010-12-16 ENCOUNTER — Encounter: Payer: Self-pay | Admitting: Cardiology

## 2010-12-16 ENCOUNTER — Ambulatory Visit (INDEPENDENT_AMBULATORY_CARE_PROVIDER_SITE_OTHER): Payer: Medicare HMO | Admitting: Cardiology

## 2010-12-16 DIAGNOSIS — I10 Essential (primary) hypertension: Secondary | ICD-10-CM

## 2010-12-16 DIAGNOSIS — E7800 Pure hypercholesterolemia, unspecified: Secondary | ICD-10-CM

## 2010-12-16 DIAGNOSIS — I2510 Atherosclerotic heart disease of native coronary artery without angina pectoris: Secondary | ICD-10-CM

## 2010-12-16 DIAGNOSIS — E663 Overweight: Secondary | ICD-10-CM

## 2010-12-16 NOTE — Assessment & Plan Note (Signed)
His BP is elevated today but this is very unusual.  I reviewed the other BP readings.  No change in therapy is indicated.

## 2010-12-16 NOTE — Patient Instructions (Signed)
Your physician has requested that you have a lexiscan myoview. For further information please visit [email or web address] Please follow instruction sheet, as given.  You will be called with the results once they are available.  Please continue medications as listed.  Follow up with Dr Antoine Poche in one year

## 2010-12-16 NOTE — Progress Notes (Signed)
HPI Patient presents for followup of his known coronary disease. Since I last saw him he has had no new cardiovascular complaints. He does do some walking on a treadmill and bicycle. He might get dyspneic with significant activity. He denies any chest pressure, neck or arm discomfort. He doesn't have any palpitations, presyncope or syncope. He has had no weight gain or edema. Unfortunately he hasn't had any weight loss either.  Allergies  Allergen Reactions  . Propoxyphene N-Acetaminophen     Current Outpatient Prescriptions  Medication Sig Dispense Refill  . aspirin 325 MG EC tablet Take 325 mg by mouth daily.        Marland Kitchen doxazosin (CARDURA) 4 MG tablet Take 4 mg by mouth daily.        . furosemide (LASIX) 20 MG tablet Take 20 mg by mouth daily.        Marland Kitchen glucose blood (FREESTYLE LITE) test strip 1 each by Other route daily as needed. Use as instructed       . Multiple Vitamin (MULTIVITAMIN) tablet Take 1 tablet by mouth daily.        Marland Kitchen omeprazole (PRILOSEC) 20 MG capsule Take 40 mg by mouth daily.        . simvastatin (ZOCOR) 40 MG tablet Take 40 mg by mouth at bedtime.        Marland Kitchen testosterone (TESTIM) 50 MG/5GM GEL Place 5 g onto the skin daily.  30 Tube  1  . valsartan (DIOVAN) 40 MG tablet Take 40 mg by mouth daily.          Past Medical History  Diagnosis Date  . Diabetes mellitus     Type II  . Hypertension   . BPH (benign prostatic hypertrophy)   . Dyslipidemia   . AAA (abdominal aortic aneurysm)   . Hearing loss   . Esophageal cancer   . CAD (coronary artery disease)     Post CABG in 2000.  LIMA to the LAD, SVG to OM, SVG to PDA, last perfusion study in 2007 with no high risk findings    Past Surgical History  Procedure Date  . Coronary artery bypass graft 2000  . Esophagectomy 2000  . Ventral hernia repair 2001    ROS:  As stated in the HPI and negative for all other systems.  PHYSICAL EXAM BP 158/68  Pulse 55  Resp 16  Ht 6' (1.829 m)  Wt 222 lb (100.699 kg)   BMI 30.11 kg/m2 GENERAL:  Well appearing HEENT:  Pupils equal round and reactive, fundi not visualized, oral mucosa unremarkable NECK:  No jugular venous distention, waveform within normal limits, carotid upstroke brisk and symmetric, no bruits, no thyromegaly LYMPHATICS:  No cervical, inguinal adenopathy LUNGS:  Clear to auscultation bilaterally BACK:  No CVA tenderness CHEST:  Well healed sternotomy scar. HEART:  PMI not displaced or sustained,S1 and S2 within normal limits, no S3, no S4, no clicks, no rubs, no murmurs ABD:  Flat, positive bowel sounds normal in frequency in pitch, no bruits, no rebound, no guarding, no midline pulsatile mass, no hepatomegaly, no splenomegaly, nonhealing abdominal wounds EXT:  2 plus pulses throughout, no edema, no cyanosis no clubbing, venous stasis changes SKIN:  No rashes no nodules NEURO:  Cranial nerves II through XII grossly intact, motor grossly intact throughout PSYCH:  Cognitively intact, oriented to person place and time  EKG:  Sinus rhythm, rate 54, axis within normal limits, intervals within normal limits, no acute ST-T wave changes.   ASSESSMENT  AND PLAN

## 2010-12-16 NOTE — Assessment & Plan Note (Signed)
The patient now has 75 year old bypass grafts.  He never really had angina as a symptom.  He needs screening with a stress test as it has been several years.  I will arrange this.  He will continue with secondary risk reduction.

## 2010-12-16 NOTE — Assessment & Plan Note (Signed)
I reviewed his lipid profile from Feb of this year and it was excellent. He will continue the meds as listed.

## 2010-12-16 NOTE — Assessment & Plan Note (Signed)
The patient understands the need to lose weight with diet and exercise. We have discussed specific strategies for this.  

## 2010-12-24 ENCOUNTER — Ambulatory Visit (HOSPITAL_COMMUNITY): Payer: Medicare HMO | Attending: Cardiology | Admitting: Radiology

## 2010-12-24 ENCOUNTER — Encounter: Payer: Self-pay | Admitting: *Deleted

## 2010-12-24 VITALS — Ht 71.0 in | Wt 218.0 lb

## 2010-12-24 DIAGNOSIS — I2510 Atherosclerotic heart disease of native coronary artery without angina pectoris: Secondary | ICD-10-CM | POA: Insufficient documentation

## 2010-12-24 DIAGNOSIS — I25810 Atherosclerosis of coronary artery bypass graft(s) without angina pectoris: Secondary | ICD-10-CM

## 2010-12-24 DIAGNOSIS — R0609 Other forms of dyspnea: Secondary | ICD-10-CM

## 2010-12-24 MED ORDER — TECHNETIUM TC 99M TETROFOSMIN IV KIT
33.0000 | PACK | Freq: Once | INTRAVENOUS | Status: AC | PRN
  Administered 2010-12-24: 33 via INTRAVENOUS

## 2010-12-24 MED ORDER — TECHNETIUM TC 99M TETROFOSMIN IV KIT
11.0000 | PACK | Freq: Once | INTRAVENOUS | Status: AC | PRN
  Administered 2010-12-24: 11 via INTRAVENOUS

## 2010-12-24 MED ORDER — REGADENOSON 0.4 MG/5ML IV SOLN
0.4000 mg | Freq: Once | INTRAVENOUS | Status: AC
  Administered 2010-12-24: 0.4 mg via INTRAVENOUS

## 2010-12-24 NOTE — Progress Notes (Addendum)
[facility] SITE 3 NUCLEAR MED [address] [place] [address] [id number]  Cardiology Nuclear Med Sarita Bottom  JAQWAN WIEBER is a 77 y.o. male [id number] 1934-04-15   Nuclear Med Background Indication for Stress Test:  Evaluation for Ischemia and Graft Patency History:  '07 OZH:YQMVHQ; '00 CABG; '07 Echo:EF=40-50%. Cardiac Risk Factors: History of Smoking, Hypertension, Lipids, NIDDM, Obesity, PVD and RBBB  Symptoms:  DOE and Palpitations   Nuclear Pre-Procedure Caffeine/Decaff Intake:  None NPO After: 8:00pm   Lungs:  Clear.  O2 sat 98% on RA. IV 0.9% NS with Angio Cath:  20g  IV Site: R Forearm  IV Started by:  Stanton Kidney, EMT-P  Chest Size (in):  46 Cup Size: n/a  Height: 5\' 11"  (1.803 m)  Weight:  218 lb (98.884 kg)  BMI:  Body mass index is 30.40 kg/(m^2). Tech Comments:  NA    Nuclear Med Study 1 or 2 day study: 1 day  Stress Test Type:  Lexiscan  Reading MD: Charlton Haws, MD  Order Authorizing Provider:  Rollene Rotunda, MD  Resting Radionuclide: Technetium 89m Tetrofosmin  Resting Radionuclide Dose: 11.0 mCi   Stress Radionuclide:  Technetium 81m Tetrofosmin  Stress Radionuclide Dose: 33.0 mCi           Stress Protocol Rest HR: 61 Stress HR: 80  Rest BP: 133/60 Stress BP: 150/57  Exercise Time (min): n/a METS: n/a   Predicted Max HR: 146 bpm % Max HR: 54.79 bpm Rate Pressure Product: 46962   Dose of Adenosine (mg):  n/a Dose of Lexiscan: 0.4 mg  Dose of Atropine (mg): n/a Dose of Dobutamine: n/a mcg/kg/min (at max HR)  Stress Test Technologist: Smiley Houseman, CMA-N  Nuclear Technologist:  Doyne Keel, CNMT     Rest Procedure:  Myocardial perfusion imaging was performed at rest 45 minutes following the intravenous administration of Technetium 53m Tetrofosmin.  Rest ECG: IRBBB  Stress Procedure: Patient attempted to walk the treadmill utilizing the Bruce protocol, but he hit the auto-stop button at 4:12 into exercise, stating his leg  hurt due to a pinched nerve.  He then received IV Lexiscan 0.4 mg over 15-seconds.  Technetium 54m Tetrofosmin injected at 30-seconds.  There were no significant changes with Lexiscan.  Quantitative spect images were obtained after a 45 minute delay.  Stress ECG: No significant change from baseline ECG  QPS Raw Data Images:  Normal; no motion artifact; normal heart/lung ratio. Stress Images:  Normal homogeneous uptake in all areas of the myocardium. Rest Images:  Normal homogeneous uptake in all areas of the myocardium. Subtraction (SDS):  Normal Transient Ischemic Dilatation (Normal <1.22):  1.01 Lung/Heart Ratio (Normal <0.45):  0.27  Quantitative Gated Spect Images QGS EDV:  130 ml QGS ESV:  64 ml QGS cine images:  NL LV Function; NL Wall Motion QGS EF: 51%  Impression Exercise Capacity:  Lexiscan with no exercise. BP Response:  Normal blood pressure response. Clinical Symptoms:  There is dyspnea. ECG Impression:  Baeline ECG with first degree block and ICRBBB Comparison with Prior Nuclear Study: No images to compare  Overall Impression:  Normal stress nuclear study.     Charlton Haws  Negative study. Continue medical management.  Rollene Rotunda

## 2010-12-25 NOTE — Progress Notes (Signed)
Nuclear report routed to Dr. Antoine Poche. Gerald Martinez, Gerald Martinez

## 2010-12-26 NOTE — Progress Notes (Signed)
Reviewed results with pt .

## 2011-01-14 ENCOUNTER — Other Ambulatory Visit: Payer: Self-pay | Admitting: *Deleted

## 2011-01-14 MED ORDER — VALSARTAN 40 MG PO TABS: 40.0000 mg | ORAL_TABLET | Freq: Every day | ORAL | Status: DC

## 2011-01-14 NOTE — Telephone Encounter (Signed)
R'cd fax from [facility] for refill of Diovan  Last OV-09/15/2010  Last filled-10/13/2010

## 2011-01-19 ENCOUNTER — Encounter: Payer: Self-pay | Admitting: Endocrinology

## 2011-01-19 ENCOUNTER — Ambulatory Visit (INDEPENDENT_AMBULATORY_CARE_PROVIDER_SITE_OTHER): Payer: Medicare HMO | Admitting: Endocrinology

## 2011-01-19 ENCOUNTER — Other Ambulatory Visit (INDEPENDENT_AMBULATORY_CARE_PROVIDER_SITE_OTHER): Payer: Medicare HMO

## 2011-01-19 DIAGNOSIS — Z125 Encounter for screening for malignant neoplasm of prostate: Secondary | ICD-10-CM | POA: Insufficient documentation

## 2011-01-19 DIAGNOSIS — E299 Testicular dysfunction, unspecified: Secondary | ICD-10-CM

## 2011-01-19 DIAGNOSIS — E7800 Pure hypercholesterolemia, unspecified: Secondary | ICD-10-CM

## 2011-01-19 DIAGNOSIS — D509 Iron deficiency anemia, unspecified: Secondary | ICD-10-CM

## 2011-01-19 DIAGNOSIS — I10 Essential (primary) hypertension: Secondary | ICD-10-CM

## 2011-01-19 DIAGNOSIS — Z79899 Other long term (current) drug therapy: Secondary | ICD-10-CM

## 2011-01-19 DIAGNOSIS — E119 Type 2 diabetes mellitus without complications: Secondary | ICD-10-CM

## 2011-01-19 DIAGNOSIS — Z0000 Encounter for general adult medical examination without abnormal findings: Secondary | ICD-10-CM

## 2011-01-19 LAB — URINALYSIS, ROUTINE W REFLEX MICROSCOPIC
Bilirubin Urine: NEGATIVE
Hgb urine dipstick: NEGATIVE
Ketones, ur: NEGATIVE
Total Protein, Urine: NEGATIVE
Urine Glucose: NEGATIVE
Urobilinogen, UA: 1 (ref 0.0–1.0)

## 2011-01-19 LAB — PSA: PSA: 2.15 ng/mL (ref 0.10–4.00)

## 2011-01-19 LAB — CBC WITH DIFFERENTIAL/PLATELET
Basophils Relative: 0.5 % (ref 0.0–3.0)
Eosinophils Absolute: 0.1 10*3/uL (ref 0.0–0.7)
Eosinophils Relative: 2.5 % (ref 0.0–5.0)
HCT: 42.9 % (ref 39.0–52.0)
Lymphs Abs: 1.5 10*3/uL (ref 0.7–4.0)
MCHC: 33.1 g/dL (ref 30.0–36.0)
MCV: 88.2 fl (ref 78.0–100.0)
Monocytes Absolute: 0.5 10*3/uL (ref 0.1–1.0)
Neutrophils Relative %: 59.5 % (ref 43.0–77.0)
RBC: 4.87 Mil/uL (ref 4.22–5.81)
WBC: 5.4 10*3/uL (ref 4.5–10.5)

## 2011-01-19 LAB — HEPATIC FUNCTION PANEL
AST: 21 U/L (ref 0–37)
Albumin: 4.1 g/dL (ref 3.5–5.2)

## 2011-01-19 LAB — LIPID PANEL
HDL: 41.9 mg/dL (ref >39.00)
LDL Cholesterol: 67 mg/dL (ref 0–99)
Total CHOL/HDL Ratio: 3
Triglycerides: 144 mg/dL (ref 0.0–149.0)
VLDL: 28.8 mg/dL (ref 0.0–40.0)

## 2011-01-19 LAB — HEMOGLOBIN A1C: Hgb A1c MFr Bld: 6.6 % — ABNORMAL HIGH (ref 4.6–6.5)

## 2011-01-19 LAB — BASIC METABOLIC PANEL
CO2: 32 mEq/L (ref 19–32)
Calcium: 9 mg/dL (ref 8.4–10.5)
Creatinine, Ser: 0.9 mg/dL (ref 0.4–1.5)
GFR: 84.23 mL/min (ref >60.00)

## 2011-01-19 LAB — MICROALBUMIN / CREATININE URINE RATIO
Creatinine,U: 150.6 mg/dL
Microalb Creat Ratio: 0.3 mg/g (ref 0.0–30.0)
Microalb, Ur: 0.4 mg/dL (ref 0.0–1.9)

## 2011-01-19 LAB — IBC PANEL: Iron: 47 ug/dL (ref 42–165)

## 2011-01-19 NOTE — Patient Instructions (Addendum)
normalization of testosterone is not known to harm you.  however, there are "theoretical" risks, including increased fertility, hair loss, prostate cancer, benign prostate enlargement, blood clots, liver problems, lower hdl ("good cholesterol"), sleep apnea, and behavior changes please consider these measures for your health:  minimize alcohol.  do not use tobacco products.  have a colonoscopy at least every 10 years from age 75.   keep firearms safely stored.  always use seat belts.  have working smoke alarms in your home.  see an eye doctor and dentist regularly.  never drive under the influence of alcohol or drugs (including prescription drugs).  those with fair skin should take precautions against the sun. please let me know what your wishes would be, if artificial life support measures should become necessary.  it is critically important to prevent falling down (keep floor areas well-lit, dry, and free of loose objects) Please make a follow-up appointment in 6 months You should have a vaccine against shingles (a painful rash which results from the  chickenpox infection which most people had many years ago).  This vaccine reduces, but does not totally eliminate the risk of shingles.  Because this is a medicare part d benefit, there are 3 ways you can get it:  You can go to a pharmacy and get the injection (I can give you a prescription), or I can give you a prescription to have filled at a pharmacy, and bring back here for [place] to give.  The other option is that you can pay up-front for it, and we'll give you a form to make a claim for reimbursement from your medicare part d carrier. here are some tests for blood in the bowels.  please follow the instructions, and return to the lab downstairs blood tests are being requested for you today.  please call [phone] to hear your test results.  You will be prompted to enter the 9-digit "MRN" number that appears at the top left of this page, followed by #.  Then you  will hear the message. (update: we discussed code status.  pt requests full code, but would not want to be started or maintained on artificial life-support measures if there was not a reasonable chance of recovery). (update: i left message on phone-tree:  i offered to increase testosterone.  i advised metformin)

## 2011-01-19 NOTE — Progress Notes (Signed)
Subjective:    Patient ID: Gerald Martinez, male    DOB: January 04, 1934, 77 y.o.   MRN: [id number]  HPI here for regular wellness examination.  He's feeling pretty well in general, and says chronic med probs are stable, except as noted below. Past Medical History  Diagnosis Date  . Diabetes mellitus     Type II  . Hypertension   . BPH (benign prostatic hypertrophy)   . Dyslipidemia   . AAA (abdominal aortic aneurysm)   . Hearing loss   . Esophageal cancer   . CAD (coronary artery disease)     Post CABG in 2000.  LIMA to the LAD, SVG to OM, SVG to PDA, last perfusion study in 2007 with no high risk findings    Past Surgical History  Procedure Date  . Coronary artery bypass graft 2000  . Esophagectomy 2000  . Ventral hernia repair 2001    History   Social History  . Marital Status: Married    Spouse Name: N/A    Number of Children: N/A  . Years of Education: N/A   Occupational History  . Not on file.   Social History Main Topics  . Smoking status: Former Smoker    Quit date: 05/25/1997  . Smokeless tobacco: Not on file  . Alcohol Use: No  . Drug Use:   . Sexually Active:    Other Topics Concern  . Not on file   Social History Narrative  . No narrative on file    Current Outpatient Prescriptions on File Prior to Visit  Medication Sig Dispense Refill  . aspirin 325 MG EC tablet Take 325 mg by mouth daily.        Marland Kitchen doxazosin (CARDURA) 4 MG tablet Take 4 mg by mouth daily.        . furosemide (LASIX) 20 MG tablet Take 20 mg by mouth daily.        Marland Kitchen glucose blood (FREESTYLE LITE) test strip 1 each by Other route daily as needed. Use as instructed       . Multiple Vitamin (MULTIVITAMIN) tablet Take 1 tablet by mouth daily.        Marland Kitchen omeprazole (PRILOSEC) 20 MG capsule Take 40 mg by mouth daily.        . simvastatin (ZOCOR) 40 MG tablet Take 40 mg by mouth at bedtime.        Marland Kitchen testosterone (TESTIM) 50 MG/5GM GEL Place 5 g onto the skin daily.  30 Tube  1  . valsartan  (DIOVAN) 40 MG tablet Take 1 tablet (40 mg total) by mouth daily.  90 tablet  1    Allergies  Allergen Reactions  . Propoxyphene N-Acetaminophen    Family History  Problem Relation Age of Onset  . Heart disease Mother   . Stroke Mother   . Cancer Father     Lymphoma  . Cancer Sister     Uncertain   BP 122/68  Pulse 68  Temp(Src) 98 F (36.7 C) (Oral)  Ht 6' (1.829 m)  Wt 218 lb 9.6 oz (99.156 kg)  BMI 29.65 kg/m2  SpO2 97%  Review of Systems  Constitutional: Negative for fever.  HENT: Negative for hearing loss.   Eyes: Negative for visual disturbance.  Respiratory: Negative for shortness of breath.   Cardiovascular: Negative for chest pain.  Gastrointestinal: Negative for abdominal pain and anal bleeding.  Genitourinary: Negative for hematuria.  Musculoskeletal: Negative for arthralgias.  Skin: Negative for rash.  Neurological: Negative  for syncope.  Hematological: Bruises/bleeds easily.  Psychiatric/Behavioral: The patient is not nervous/anxious.       Objective:   Physical Exam VS: see vs page GEN: no distress HEAD: head: no deformity eyes: no periorbital swelling, no proptosis external nose and ears are normal mouth: no lesion seen NECK: a healed scar is present.  i do not appreciate a nodule in the thyroid or elsewhere in the neck CHEST WALL: no deformity.  There is a midline sternotomy scar.  LUNGS: clear to auscultation. CV: reg rate and rhythm, no murmur ABD: abdomen is soft, nontender.  no hepatosplenomegaly.  not distended.  no hernia RECTAL: no mass.  heme neg. MUSCULOSKELETAL: muscle bulk and strength are grossly normal.  no obvious joint swelling.  gait is normal and steady EXTEMITIES: no deformity.  no ulcer on the feet.  feet are of normal color and temp.  Trace bilat leg edema.  There are bilat vein harvest scars on the legs.  There is bilteral onychomycosis PULSES: dorsalis pedis intact bilat, but decreased from normal.  no carotid bruit NEURO:   cn 2-12 grossly intact.   readily moves all 4's.  sensation is intact to touch on the feet SKIN:  Normal texture and temperature.  No rash or suspicious lesion is visible.   NODES:  None palpable at the neck PSYCH: alert, oriented x3.  Does not appear anxious nor depressed.     Assessment & Plan:  Wellness visit today, with problems stable, except as noted.   SEPARATE EVALUATION FOLLOWS--EACH PROBLEM HERE IS NEW, NOT RESPONDING TO TREATMENT, OR POSES SIGNIFICANT RISK TO THE PATIENT'S HEALTH: HISTORY OF THE PRESENT ILLNESS: Pt says he takes the testosterone cream as rx'ed.  pt states he feels well in general. He also takes metformin for dm PAST MEDICAL HISTORY reviewed and up to date today REVIEW OF SYSTEMS: Denies depression and weight change PHYSICAL EXAMINATION: VS: see vital signs page Rectal: normal prostate Breasts: no gynecomastia LAB/XRAY RESULTS: Lab Results  Component Value Date   TESTOSTERONE 315.85* 01/19/2011   Lab Results  Component Value Date   HGBA1C 6.6* 01/19/2011  IMPRESSION: Hypogonadism, needs increased rx Dm, he needs increased rx, if it can be done with a regimen that avoids or minimizes hypoglycemia. PLAN: See instruction page

## 2011-01-20 ENCOUNTER — Other Ambulatory Visit: Payer: Self-pay | Admitting: *Deleted

## 2011-01-20 MED ORDER — FUROSEMIDE 20 MG PO TABS: 20.0000 mg | ORAL_TABLET | Freq: Every day | ORAL | Status: DC

## 2011-01-20 MED ORDER — OMEPRAZOLE 20 MG PO CPDR: 40.0000 mg | DELAYED_RELEASE_CAPSULE | Freq: Every day | ORAL | Status: DC

## 2011-01-20 MED ORDER — GLUCOSE BLOOD VI STRP: ORAL_STRIP | Status: DC

## 2011-01-20 MED ORDER — SIMVASTATIN 40 MG PO TABS: 40.0000 mg | ORAL_TABLET | Freq: Every day | ORAL | Status: DC

## 2011-01-20 MED ORDER — VALSARTAN 40 MG PO TABS: 40.0000 mg | ORAL_TABLET | Freq: Every day | ORAL | Status: DC

## 2011-01-20 MED ORDER — DOXAZOSIN MESYLATE 4 MG PO TABS: 4.0000 mg | ORAL_TABLET | Freq: Every day | ORAL | Status: DC

## 2011-01-20 NOTE — Telephone Encounter (Signed)
Pt called requesting refills of all medications sent to [facility].

## 2011-01-21 ENCOUNTER — Telehealth: Payer: Self-pay

## 2011-01-21 MED ORDER — TESTOSTERONE 50 MG/5GM (1%) TD GEL: 10.0000 g | Freq: Every day | TRANSDERMAL | Status: DC

## 2011-01-21 MED ORDER — METFORMIN HCL ER 500 MG PO TB24: 500.0000 mg | ORAL_TABLET | Freq: Every day | ORAL | Status: DC

## 2011-01-21 NOTE — Telephone Encounter (Signed)
Pt called stating he has received phone tree results and he does want to start Metformin as well as increase testosterone. Rx to [facility]

## 2011-01-21 NOTE — Telephone Encounter (Signed)
i printed rx for testosterone i sent rx for metformin

## 2011-01-21 NOTE — Telephone Encounter (Signed)
Pt informed

## 2011-01-22 ENCOUNTER — Other Ambulatory Visit: Payer: Self-pay | Admitting: *Deleted

## 2011-01-22 MED ORDER — ZOSTER VACCINE LIVE 19400 UNT/0.65ML ~~LOC~~ SOLR: 0.6500 mL | Freq: Once | SUBCUTANEOUS | Status: DC

## 2011-01-22 NOTE — Telephone Encounter (Signed)
R'cd fax from [facility] for Zostavax refill. Pt wants to have immunization administered at the pharmacy.

## 2011-03-12 ENCOUNTER — Ambulatory Visit (INDEPENDENT_AMBULATORY_CARE_PROVIDER_SITE_OTHER): Payer: Medicare HMO | Admitting: *Deleted

## 2011-03-12 DIAGNOSIS — Z23 Encounter for immunization: Secondary | ICD-10-CM

## 2011-04-14 ENCOUNTER — Other Ambulatory Visit: Payer: Self-pay | Admitting: Endocrinology

## 2011-05-05 ENCOUNTER — Ambulatory Visit (INDEPENDENT_AMBULATORY_CARE_PROVIDER_SITE_OTHER): Payer: Medicare HMO | Admitting: Endocrinology

## 2011-05-05 ENCOUNTER — Encounter: Payer: Self-pay | Admitting: Endocrinology

## 2011-05-05 ENCOUNTER — Other Ambulatory Visit (INDEPENDENT_AMBULATORY_CARE_PROVIDER_SITE_OTHER): Payer: Medicare HMO

## 2011-05-05 DIAGNOSIS — E119 Type 2 diabetes mellitus without complications: Secondary | ICD-10-CM

## 2011-05-05 DIAGNOSIS — E299 Testicular dysfunction, unspecified: Secondary | ICD-10-CM

## 2011-05-05 LAB — HEMOGLOBIN A1C: Hgb A1c MFr Bld: 6.5 % (ref 4.6–6.5)

## 2011-05-05 MED ORDER — CLOMIPHENE CITRATE 50 MG PO TABS: ORAL_TABLET | ORAL | Status: DC

## 2011-05-05 MED ORDER — CLOMIPHENE CITRATE 50 MG PO TABS: 50.0000 mg | ORAL_TABLET | Freq: Every day | ORAL | Status: DC

## 2011-05-05 NOTE — Progress Notes (Signed)
Subjective:    Patient ID: Gerald Martinez, male    DOB: [birth year], 77 y.o.   MRN: [id number]  HPI The state of at least three ongoing medical problems is addressed today: Hypogonadism: since on testim, he feels more energetic.  However, he says he cannot afford it. Denies urinary hesitancy. Pt returns for f/u of type 2 DM (1992).  no cbg record, but states cbg's are well-controlled. Past Medical History  Diagnosis Date  . Diabetes mellitus     Type II  . Hypertension   . BPH (benign prostatic hypertrophy)   . Dyslipidemia   . AAA (abdominal aortic aneurysm)   . Hearing loss   . Esophageal cancer   . CAD (coronary artery disease)     Post CABG in 2000.  LIMA to the LAD, SVG to OM, SVG to PDA, last perfusion study in 2007 with no high risk findings    Past Surgical History  Procedure Date  . Coronary artery bypass graft 2000  . Esophagectomy 2000  . Ventral hernia repair 2001    History   Social History  . Marital Status: Married    Spouse Name: N/A    Number of Children: N/A  . Years of Education: N/A   Occupational History  . Not on file.   Social History Main Topics  . Smoking status: Former Smoker    Quit date: 05/25/1997  . Smokeless tobacco: Not on file  . Alcohol Use: No  . Drug Use:   . Sexually Active:    Other Topics Concern  . Not on file   Social History Narrative  . No narrative on file    Current Outpatient Prescriptions on File Prior to Visit  Medication Sig Dispense Refill  . aspirin 325 MG EC tablet Take 325 mg by mouth daily.        Marland Kitchen doxazosin (CARDURA) 4 MG tablet Take 1 tablet (4 mg total) by mouth daily.  90 tablet  3  . furosemide (LASIX) 20 MG tablet Take 1 tablet (20 mg total) by mouth daily.  90 tablet  3  . glucose blood (FREESTYLE LITE) test strip Use as instructed once daily  100 each  11  . metFORMIN (GLUCOPHAGE-XR) 500 MG 24 hr tablet Take 1 tablet (500 mg total) by mouth daily with breakfast.  30 tablet  11  . Multiple  Vitamin (MULTIVITAMIN) tablet Take 1 tablet by mouth daily.        Marland Kitchen omeprazole (PRILOSEC) 20 MG capsule Take 2 capsules (40 mg total) by mouth daily.  180 capsule  3  . simvastatin (ZOCOR) 40 MG tablet Take 1 tablet (40 mg total) by mouth at bedtime.  90 tablet  3  . valsartan (DIOVAN) 40 MG tablet Take 1 tablet (40 mg total) by mouth daily.  90 tablet  3    Allergies  Allergen Reactions  . Propoxyphene N-Acetaminophen     Family History  Problem Relation Age of Onset  . Heart disease Mother   . Stroke Mother   . Cancer Father     Lymphoma  . Cancer Sister     Uncertain   BP 112/60  Pulse 61  Temp(Src) 98 F (36.7 C) (Oral)  Ht 6' (1.829 m)  Wt 218 lb 3.2 oz (98.975 kg)  BMI 29.59 kg/m2  SpO2 95%  Review of Systems Denies weight change and ED sxs.    Objective:   Physical Exam VITAL SIGNS:  See vs page GENERAL: no distress GENITALIA:  Normal male testicles, scrotum, and penis  Lab Results  Component Value Date   TESTOSTERONE 355.74 05/05/2011   Lab Results  Component Value Date   HGBA1C 6.5 05/05/2011      Assessment & Plan:  DM, well-controlled Hypogonadism, well-replaced, but he says he can no longer buy the testim BPH, not worse with normalization of testosterone.

## 2011-05-05 NOTE — Patient Instructions (Addendum)
Change "testim" to "clomiphine" 1/2 pill daily.  Based on your blood tests from 2008, i am not sure this would help.  However, it is cheaper than androgel, and is worth a try. blood tests are being requested for you today.  please call [phone] to hear your test results.  You will be prompted to enter the 9-digit "MRN" number that appears at the top left of this page, followed by #.  Then you will hear the message. Please come back for a follow-up appointment in 4 months. (update: i left message on phone-tree:  rx as we discussed)

## 2011-08-14 ENCOUNTER — Ambulatory Visit (INDEPENDENT_AMBULATORY_CARE_PROVIDER_SITE_OTHER): Payer: Medicare HMO | Admitting: Endocrinology

## 2011-08-14 ENCOUNTER — Encounter: Payer: Self-pay | Admitting: Endocrinology

## 2011-08-14 ENCOUNTER — Other Ambulatory Visit (INDEPENDENT_AMBULATORY_CARE_PROVIDER_SITE_OTHER): Payer: Medicare HMO

## 2011-08-14 VITALS — BP 122/70 | HR 56 | Temp 97.7°F | Ht 72.0 in | Wt 217.0 lb

## 2011-08-14 DIAGNOSIS — E119 Type 2 diabetes mellitus without complications: Secondary | ICD-10-CM

## 2011-08-14 DIAGNOSIS — E299 Testicular dysfunction, unspecified: Secondary | ICD-10-CM

## 2011-08-14 LAB — HEMOGLOBIN A1C: Hgb A1c MFr Bld: 6.6 % — ABNORMAL HIGH (ref 4.6–6.5)

## 2011-08-14 NOTE — Patient Instructions (Addendum)
blood tests are being requested for you today.  You will receive a letter with results. Please come back for a regular physical appointment after 01/19/12. Here are some samples of "lidoderm" patches.  Put 1 on your lower back, daily.  If this helps, please call so i can prescribe for you.   (see letter)

## 2011-08-14 NOTE — Progress Notes (Signed)
Subjective:    Patient ID: Gerald Martinez, male    DOB: February 05, 1934, 78 y.o.   MRN: [id number]  HPI since on testim, he feels more energetic. Despite the cost, he has chosen to continue it.  Pt returns for f/u of type 2 DM (1992). no cbg record, but states cbg's are well-controlled.  Pt states many years of moderate pain at the lower back, and assoc numbness of both feet Past Medical History  Diagnosis Date  . Diabetes mellitus     Type II  . Hypertension   . BPH (benign prostatic hypertrophy)   . Dyslipidemia   . AAA (abdominal aortic aneurysm)   . Hearing loss   . Esophageal cancer   . CAD (coronary artery disease)     Post CABG in 2000.  LIMA to the LAD, SVG to OM, SVG to PDA, last perfusion study in 2007 with no high risk findings    Past Surgical History  Procedure Date  . Coronary artery bypass graft 2000  . Esophagectomy 2000  . Ventral hernia repair 2001    History   Social History  . Marital Status: Married    Spouse Name: N/A    Number of Children: N/A  . Years of Education: N/A   Occupational History  . Not on file.   Social History Main Topics  . Smoking status: Former Smoker    Quit date: 05/25/1997  . Smokeless tobacco: Not on file  . Alcohol Use: No  . Drug Use:   . Sexually Active:    Other Topics Concern  . Not on file   Social History Narrative  . No narrative on file    Current Outpatient Prescriptions on File Prior to Visit  Medication Sig Dispense Refill  . aspirin 325 MG EC tablet Take 325 mg by mouth daily.        Marland Kitchen doxazosin (CARDURA) 4 MG tablet Take 1 tablet (4 mg total) by mouth daily.  90 tablet  3  . furosemide (LASIX) 20 MG tablet Take 1 tablet (20 mg total) by mouth daily.  90 tablet  3  . glucose blood (FREESTYLE LITE) test strip Use as instructed once daily  100 each  11  . metFORMIN (GLUCOPHAGE-XR) 500 MG 24 hr tablet Take 1 tablet (500 mg total) by mouth daily with breakfast.  30 tablet  11  . Multiple Vitamin  (MULTIVITAMIN) tablet Take 1 tablet by mouth daily.        Marland Kitchen omeprazole (PRILOSEC) 20 MG capsule Take 2 capsules (40 mg total) by mouth daily.  180 capsule  3  . simvastatin (ZOCOR) 40 MG tablet Take 1 tablet (40 mg total) by mouth at bedtime.  90 tablet  3  . valsartan (DIOVAN) 40 MG tablet Take 1 tablet (40 mg total) by mouth daily.  90 tablet  3    Allergies  Allergen Reactions  . Propoxyphene N-Acetaminophen     Family History  Problem Relation Age of Onset  . Heart disease Mother   . Stroke Mother   . Cancer Father     Lymphoma  . Cancer Sister     Uncertain    BP 122/70  Pulse 56  Temp(Src) 97.7 F (36.5 C) (Oral)  Ht 6' (1.829 m)  Wt 217 lb (98.431 kg)  BMI 29.43 kg/m2  SpO2 97%  Review of Systems Denies urinary hesitancy and weight change.      Objective:   Physical Exam VITAL SIGNS:  See vs page  GENERAL: no distress Pulses: dorsalis pedis intact bilat.   Feet: no deformity.  no ulcer on the feet.  feet are of normal color and temp.  no edema.  onychomycosis of toenails.  There are old healed vein-harvest scars on both leg.   Neuro: sensation is intact to touch on the feet, but decreased from normal  Lab Results  Component Value Date   HGBA1C 6.6* 08/14/2011   Lab Results  Component Value Date   TESTOSTERONE 268.04* 08/14/2011      Assessment & Plan:  Low-back pain, needs increased rx DM, well-controlled Hypogonadism, needs increased rx

## 2011-08-15 ENCOUNTER — Encounter: Payer: Self-pay | Admitting: Endocrinology

## 2011-08-17 ENCOUNTER — Telehealth: Payer: Self-pay

## 2011-08-17 DIAGNOSIS — E299 Testicular dysfunction, unspecified: Secondary | ICD-10-CM

## 2011-08-17 MED ORDER — LIDOCAINE 5 % EX PTCH: 1.0000 | MEDICATED_PATCH | CUTANEOUS | Status: DC

## 2011-08-17 NOTE — Telephone Encounter (Addendum)
Pt informed of Lidoderm rx. Pt also informed of A1c and testosterone results. Pt states that he misses a dose of Testim on occasion, but not regularly.

## 2011-08-17 NOTE — Telephone Encounter (Signed)
Pt informed of MD's advisement. Pt wants to come in about 3 months to come have testosterone level rechecked-okay to put labs in?

## 2011-08-17 NOTE — Telephone Encounter (Signed)
Your testosterone wasn't too low.  Please continue same rx, and we'll recheck next time.

## 2011-08-17 NOTE — Telephone Encounter (Signed)
We'll recheck at next ov

## 2011-08-17 NOTE — Telephone Encounter (Signed)
sent 

## 2011-08-17 NOTE — Telephone Encounter (Signed)
Pt called stating that Lidoderm patches worked well, pt is requesting Rx for same.

## 2011-08-18 ENCOUNTER — Telehealth: Payer: Self-pay | Admitting: *Deleted

## 2011-08-18 NOTE — Telephone Encounter (Signed)
PA required on Lidoderm patch-PA denied-pt must have tried and failed Gabapentin.

## 2011-08-19 MED ORDER — GABAPENTIN (ONCE-DAILY) 300 MG PO TABS: 1.0000 | ORAL_TABLET | Freq: Every day | ORAL | Status: DC

## 2011-08-19 NOTE — Telephone Encounter (Signed)
please call patient: i am happy to prescribe gabapentin (pill) for him if he wishes.

## 2011-08-19 NOTE — Telephone Encounter (Signed)
Pt informed, he is willing to try Gabapentin.

## 2011-08-19 NOTE — Telephone Encounter (Signed)
Called pt, no answer, line busy.  

## 2011-08-19 NOTE — Telephone Encounter (Signed)
Pt informed rx sent and to callback office if rx is not helping.

## 2011-08-19 NOTE — Telephone Encounter (Signed)
i sent rx Please call in 7-10 days if it does not help, so we can double it

## 2011-08-26 ENCOUNTER — Telehealth: Payer: Self-pay

## 2011-08-26 NOTE — Telephone Encounter (Signed)
Pt called stating that Gabapentin is not helping his lower back pain.  Pt is also requesting to "double up" on testosterone because he still does not have enough energy. Pt aware MD out of office until 04/08 is is willing to wait.

## 2011-08-31 MED ORDER — GABAPENTIN 300 MG PO CAPS: 300.0000 mg | ORAL_CAPSULE | Freq: Two times a day (BID) | ORAL | Status: DC

## 2011-08-31 NOTE — Telephone Encounter (Signed)
i doubled gabapentin Please come in to recheck testosterone level

## 2011-08-31 NOTE — Telephone Encounter (Signed)
Unable to leave message on mobile #, no ring/busy signal on home #.

## 2011-08-31 NOTE — Telephone Encounter (Signed)
Pt will not take Gabapentin because it makes him too sleepy, he wants PA for Lidoderm patch sent in to insurance company. Pt scheduled appointment for tomorrow at 11:15am to discuss with MD.

## 2011-09-01 ENCOUNTER — Other Ambulatory Visit (INDEPENDENT_AMBULATORY_CARE_PROVIDER_SITE_OTHER): Payer: Medicare HMO

## 2011-09-01 ENCOUNTER — Encounter: Payer: Self-pay | Admitting: Endocrinology

## 2011-09-01 ENCOUNTER — Ambulatory Visit (INDEPENDENT_AMBULATORY_CARE_PROVIDER_SITE_OTHER): Payer: Medicare HMO | Admitting: Endocrinology

## 2011-09-01 VITALS — BP 130/72 | HR 68 | Temp 97.6°F | Ht 72.0 in | Wt 217.0 lb

## 2011-09-01 DIAGNOSIS — E299 Testicular dysfunction, unspecified: Secondary | ICD-10-CM

## 2011-09-01 DIAGNOSIS — K219 Gastro-esophageal reflux disease without esophagitis: Secondary | ICD-10-CM | POA: Insufficient documentation

## 2011-09-01 NOTE — Progress Notes (Signed)
Subjective:    Patient ID: Gerald Martinez, male    DOB: 04/03/1934, 78 y.o.   MRN: [id number]  HPI The state of at least three ongoing medical problems is addressed today: Low-back pain: Pt says lidoderm helps, and he wants me to do prior auth. for this.  Tylenol causes insufficient relief, and he does not want meds which would cause drowsiness.   neurontin causes drowsiness.   Gerd: He says prilosec helps sxs well.  He says sxs are sometimes controlled by 20 mg qd, but he often needs 40 mg.   Hypogonadism.  Pt says he wants to increase testosterone Past Medical History  Diagnosis Date  . Diabetes mellitus     Type II  . Hypertension   . BPH (benign prostatic hypertrophy)   . Dyslipidemia   . AAA (abdominal aortic aneurysm)   . Hearing loss   . Esophageal cancer   . CAD (coronary artery disease)     Post CABG in 2000.  LIMA to the LAD, SVG to OM, SVG to PDA, last perfusion study in 2007 with no high risk findings    Past Surgical History  Procedure Date  . Coronary artery bypass graft 2000  . Esophagectomy 2000  . Ventral hernia repair 2001    History   Social History  . Marital Status: Married    Spouse Name: N/A    Number of Children: N/A  . Years of Education: N/A   Occupational History  . Not on file.   Social History Main Topics  . Smoking status: Former Smoker    Quit date: 05/25/1997  . Smokeless tobacco: Not on file  . Alcohol Use: No  . Drug Use:   . Sexually Active:    Other Topics Concern  . Not on file   Social History Narrative  . No narrative on file    Current Outpatient Prescriptions on File Prior to Visit  Medication Sig Dispense Refill  . aspirin 325 MG EC tablet Take 325 mg by mouth daily.        Marland Kitchen doxazosin (CARDURA) 4 MG tablet Take 1 tablet (4 mg total) by mouth daily.  90 tablet  3  . furosemide (LASIX) 20 MG tablet Take 1 tablet (20 mg total) by mouth daily.  90 tablet  3  . glucose blood (FREESTYLE LITE) test strip Use as  instructed once daily  100 each  11  . lidocaine (LIDODERM) 5 % Place 1 patch onto the skin daily. Remove & Discard patch within 12 hours or as directed by MD  30 patch  11  . Melatonin 10 MG TABS Take 1 tablet by mouth as needed.      . metFORMIN (GLUCOPHAGE-XR) 500 MG 24 hr tablet Take 1 tablet (500 mg total) by mouth daily with breakfast.  30 tablet  11  . Multiple Vitamin (MULTIVITAMIN) tablet Take 1 tablet by mouth daily.        Marland Kitchen omeprazole (PRILOSEC) 20 MG capsule Take 2 capsules (40 mg total) by mouth daily.  180 capsule  3  . simvastatin (ZOCOR) 40 MG tablet Take 1 tablet (40 mg total) by mouth at bedtime.  90 tablet  3  . TESTIM 50 MG/5GM GEL 10 g daily.       . valsartan (DIOVAN) 40 MG tablet Take 1 tablet (40 mg total) by mouth daily.  90 tablet  3  . gabapentin (NEURONTIN) 300 MG capsule Take 1 capsule (300 mg total) by mouth 2 (two)  times daily.  60 capsule  11    Allergies  Allergen Reactions  . Propoxacet-N     Family History  Problem Relation Age of Onset  . Heart disease Mother   . Stroke Mother   . Cancer Father     Lymphoma  . Cancer Sister     Uncertain   BP 130/72  Pulse 68  Temp(Src) 97.6 F (36.4 C) (Oral)  Ht 6' (1.829 m)  Wt 217 lb (98.431 kg)  BMI 29.43 kg/m2  SpO2 98%  Review of Systems  Gastrointestinal: Negative for anal bleeding.  Genitourinary: Negative for difficulty urinating.      Objective:   Physical Exam VITAL SIGNS:  See vs page GENERAL: no distress Spine: nontender Gait: normal and steady  Lab Results  Component Value Date   TESTOSTERONE 510.81 09/01/2011   TESTOSTERONE 651.24 09/01/2011      Assessment & Plan:  Low-back pain, improved with lidoderm Gerd.  Although it is well-controlled on prilosec, he should not take nsaid Drowsiness, due to neurontin Testosterone.  He does not need a level this high

## 2011-09-01 NOTE — Patient Instructions (Signed)
blood tests are being requested for you today.  You will receive a letter with results. i'll do the prior authorization form for lidoderm.

## 2011-09-02 ENCOUNTER — Other Ambulatory Visit: Payer: Self-pay | Admitting: *Deleted

## 2011-09-02 ENCOUNTER — Telehealth: Payer: Self-pay | Admitting: *Deleted

## 2011-09-02 LAB — TESTOSTERONE, FREE, TOTAL, SHBG
Sex Hormone Binding: 24 nmol/L (ref 13–71)
Testosterone, Free: 127.3 pg/mL (ref 47.0–244.0)

## 2011-09-02 NOTE — Telephone Encounter (Signed)
Dosage is 5 mg qd

## 2011-09-02 NOTE — Telephone Encounter (Signed)
Pt states that he needs refill of Testim rx. Rx was previously written for 5 grams daily and pt has increased to 10 grams daily and is almost of out of medication. Pt needs rx written for 10 grams daily.

## 2011-09-02 NOTE — Telephone Encounter (Signed)
Called pt to inform of testosterone results, pt informed. (Letter also mailed to pt) 

## 2011-09-03 NOTE — Telephone Encounter (Signed)
The blood test says he needs to decrease back to 5 mg.

## 2011-09-03 NOTE — Telephone Encounter (Signed)
Pt states that 5 grams once daily is not enough and he has increased to 10 grams

## 2011-09-03 NOTE — Telephone Encounter (Signed)
Called pt, no answer/unable to leave message.  

## 2011-09-04 ENCOUNTER — Other Ambulatory Visit: Payer: Self-pay | Admitting: Endocrinology

## 2011-09-04 MED ORDER — TESTOSTERONE 50 MG/5GM (1%) TD GEL: 5.0000 g | Freq: Every day | TRANSDERMAL | Status: DC

## 2011-09-04 NOTE — Telephone Encounter (Signed)
Rx faxed to [facility]. 

## 2011-09-04 NOTE — Telephone Encounter (Signed)
Pt informed rx is for 5grams daily.

## 2011-09-11 ENCOUNTER — Telehealth: Payer: Self-pay

## 2011-09-11 NOTE — Telephone Encounter (Signed)
A user error has taken place: encounter opened in error, closed for administrative reasons.

## 2011-10-14 ENCOUNTER — Other Ambulatory Visit: Payer: Self-pay | Admitting: *Deleted

## 2011-10-14 MED ORDER — VALSARTAN 40 MG PO TABS: 40.0000 mg | ORAL_TABLET | Freq: Every day | ORAL | Status: DC

## 2011-10-14 NOTE — Telephone Encounter (Signed)
R'cd fax from [facility] for refill of Testim-last written 09/04/2011 #5g with 0 refills. Please advise on Testim refill.

## 2011-10-15 MED ORDER — TESTOSTERONE 50 MG/5GM (1%) TD GEL: 5.0000 g | Freq: Every day | TRANSDERMAL | Status: DC

## 2011-10-15 NOTE — Telephone Encounter (Signed)
i printed 

## 2011-10-15 NOTE — Telephone Encounter (Signed)
Rx faxed to [facility]. 

## 2011-12-08 LAB — HM DIABETES EYE EXAM

## 2011-12-31 ENCOUNTER — Ambulatory Visit (INDEPENDENT_AMBULATORY_CARE_PROVIDER_SITE_OTHER): Payer: Medicare HMO | Admitting: Cardiology

## 2011-12-31 ENCOUNTER — Encounter: Payer: Self-pay | Admitting: Cardiology

## 2011-12-31 VITALS — BP 140/75 | HR 59 | Ht 72.0 in | Wt 220.1 lb

## 2011-12-31 DIAGNOSIS — I2510 Atherosclerotic heart disease of native coronary artery without angina pectoris: Secondary | ICD-10-CM

## 2011-12-31 NOTE — Patient Instructions (Addendum)
The current medical regimen is effective;  continue present plan and medications.  Follow up in 1 year with Dr Hochrein.  You will receive a letter in the mail 2 months before you are due.  Please call us when you receive this letter to schedule your follow up appointment.  

## 2011-12-31 NOTE — Progress Notes (Signed)
HPI Patient presents for followup of his known coronary disease. Since I last saw him he has had no new cardiovascular complaints. He has some back pain  But despite this he is able to do walking and exercising at the [facility] routinely. With this he denies any chest pressure, neck or arm discomfort. He's had no palpitations, presyncope or syncope. He has had no PND or orthopnea. He did have a stress perfusion study last year with no evidence of infarct or ischemia and an EF of 51%.  Allergies  Allergen Reactions  . Propoxyphene-Acetaminophen     Current Outpatient Prescriptions  Medication Sig Dispense Refill  . aspirin 325 MG EC tablet Take 325 mg by mouth daily.        Marland Kitchen doxazosin (CARDURA) 4 MG tablet Take 1 tablet (4 mg total) by mouth daily.  90 tablet  3  . furosemide (LASIX) 20 MG tablet Take 1 tablet (20 mg total) by mouth daily.  90 tablet  3  . glucose blood (FREESTYLE LITE) test strip Use as instructed once daily  100 each  11  . Melatonin 10 MG TABS Take 1 tablet by mouth as needed.      . metFORMIN (GLUCOPHAGE-XR) 500 MG 24 hr tablet Take 1 tablet (500 mg total) by mouth daily with breakfast.  30 tablet  11  . Multiple Vitamin (MULTIVITAMIN) tablet Take 1 tablet by mouth daily.        Marland Kitchen omeprazole (PRILOSEC) 20 MG capsule Take 2 capsules (40 mg total) by mouth daily.  180 capsule  3  . simvastatin (ZOCOR) 40 MG tablet Take 1 tablet (40 mg total) by mouth at bedtime.  90 tablet  3  . testosterone (TESTIM) 50 MG/5GM GEL Place 5 g onto the skin daily.  5 g  5  . valsartan (DIOVAN) 40 MG tablet Take 1 tablet (40 mg total) by mouth daily.  90 tablet  3    Past Medical History  Diagnosis Date  . Diabetes mellitus     Type II  . Hypertension   . BPH (benign prostatic hypertrophy)   . Dyslipidemia   . AAA (abdominal aortic aneurysm)   . Hearing loss   . Esophageal cancer   . CAD (coronary artery disease)     Post CABG in 2000.  LIMA to the LAD, SVG to OM, SVG to PDA, last  perfusion study in 2007 with no high risk findings    Past Surgical History  Procedure Date  . Coronary artery bypass graft 2000  . Esophagectomy 2000  . Ventral hernia repair 2001    ROS:  Back and leg problems. As stated in the HPI and negative for all other systems.  PHYSICAL EXAM BP 140/75  Pulse 59  Ht 6' (1.829 m)  Wt 220 lb 1.9 oz (99.846 kg)  BMI 29.85 kg/m2 GENERAL:  Well appearing  NECK:  No jugular venous distention, waveform within normal limits, carotid upstroke brisk and symmetric, no bruits, no thyromegaly LUNGS:  Clear to auscultation bilaterally BACK:  No CVA tenderness CHEST:  Well healed sternotomy scar. HEART:  PMI not displaced or sustained,S1 and S2 within normal limits, no S3, no S4, no clicks, no rubs, no murmurs ABD:  Flat, positive bowel sounds normal in frequency in pitch, no bruits, no rebound, no guarding, no midline pulsatile mass, no hepatomegaly, no splenomegaly, nonhealing abdominal wounds EXT:  2 plus pulses throughout, no edema, no cyanosis no clubbing, venous stasis changes   EKG:  Sinus rhythm, rate 59, axis within normal limits, intervals within normal limits, no acute ST-T wave changes.  12/31/2011   ASSESSMENT AND PLAN  CORONARY ATHEROSCLEROSIS NATIVE CORONARY ARTERY -  He has had no new symptoms since his stress test last year. No further cardiovascular testing is suggested. She will continue with risk reduction.  HYPERTENSION -  The blood pressure is at target. No change in medications is indicated. We will continue with therapeutic lifestyle changes (TLC).  HYPERCHOLESTEROLEMIA -  I have asked him to get a fasting lipid profile when he sees Dr. Everardo All later this month.  Overweight -  The patient understands the need to lose weight with diet and exercise. We have discussed specific strategies for this.

## 2012-01-15 ENCOUNTER — Other Ambulatory Visit: Payer: Self-pay | Admitting: Endocrinology

## 2012-01-21 ENCOUNTER — Encounter: Payer: Self-pay | Admitting: Endocrinology

## 2012-01-21 ENCOUNTER — Ambulatory Visit (INDEPENDENT_AMBULATORY_CARE_PROVIDER_SITE_OTHER): Payer: Medicare HMO | Admitting: Endocrinology

## 2012-01-21 ENCOUNTER — Other Ambulatory Visit (INDEPENDENT_AMBULATORY_CARE_PROVIDER_SITE_OTHER): Payer: Medicare HMO

## 2012-01-21 VITALS — BP 116/52 | HR 92 | Temp 97.9°F | Ht 72.0 in | Wt 219.0 lb

## 2012-01-21 DIAGNOSIS — E299 Testicular dysfunction, unspecified: Secondary | ICD-10-CM

## 2012-01-21 DIAGNOSIS — Z125 Encounter for screening for malignant neoplasm of prostate: Secondary | ICD-10-CM

## 2012-01-21 DIAGNOSIS — E119 Type 2 diabetes mellitus without complications: Secondary | ICD-10-CM

## 2012-01-21 DIAGNOSIS — I10 Essential (primary) hypertension: Secondary | ICD-10-CM

## 2012-01-21 DIAGNOSIS — E7800 Pure hypercholesterolemia, unspecified: Secondary | ICD-10-CM

## 2012-01-21 DIAGNOSIS — N400 Enlarged prostate without lower urinary tract symptoms: Secondary | ICD-10-CM

## 2012-01-21 DIAGNOSIS — D509 Iron deficiency anemia, unspecified: Secondary | ICD-10-CM

## 2012-01-21 DIAGNOSIS — Z79899 Other long term (current) drug therapy: Secondary | ICD-10-CM

## 2012-01-21 LAB — URINALYSIS, ROUTINE W REFLEX MICROSCOPIC
Bilirubin Urine: NEGATIVE
Nitrite: NEGATIVE
Specific Gravity, Urine: 1.01 (ref 1.000–1.030)
pH: 7 (ref 5.0–8.0)

## 2012-01-21 LAB — CBC WITH DIFFERENTIAL/PLATELET
Basophils Absolute: 0 10*3/uL (ref 0.0–0.1)
Lymphocytes Relative: 27.4 % (ref 12.0–46.0)
Monocytes Relative: 9.4 % (ref 3.0–12.0)
Neutrophils Relative %: 60.6 % (ref 43.0–77.0)
Platelets: 180 10*3/uL (ref 150.0–400.0)
RDW: 17 % — ABNORMAL HIGH (ref 11.5–14.6)

## 2012-01-21 LAB — IBC PANEL
Saturation Ratios: 15.6 % — ABNORMAL LOW (ref 20.0–50.0)
Transferrin: 297.2 mg/dL (ref 212.0–360.0)

## 2012-01-21 LAB — BASIC METABOLIC PANEL
BUN: 14 mg/dL (ref 6–23)
CO2: 32 mEq/L (ref 19–32)
Calcium: 9.2 mg/dL (ref 8.4–10.5)
Chloride: 98 mEq/L (ref 96–112)
Creatinine, Ser: 1 mg/dL (ref 0.4–1.5)

## 2012-01-21 LAB — HEPATIC FUNCTION PANEL
Albumin: 3.9 g/dL (ref 3.5–5.2)
Total Bilirubin: 0.8 mg/dL (ref 0.3–1.2)

## 2012-01-21 LAB — LIPID PANEL
HDL: 42 mg/dL (ref >39.00)
Total CHOL/HDL Ratio: 3

## 2012-01-21 LAB — PSA: PSA: 2.13 ng/mL (ref 0.10–4.00)

## 2012-01-21 LAB — HEMOGLOBIN A1C: Hgb A1c MFr Bld: 6.4 % (ref 4.6–6.5)

## 2012-01-21 LAB — MICROALBUMIN / CREATININE URINE RATIO: Creatinine,U: 88.1 mg/dL

## 2012-01-21 NOTE — Progress Notes (Signed)
Subjective:    Patient ID: Gerald Martinez, male    DOB: 1934-03-23, 78 y.o.   MRN: [id number]  HPI here for regular wellness examination.  He's feeling pretty well in general, and says chronic med probs are stable, except as noted below Past Medical History  Diagnosis Date  . Diabetes mellitus     Type II  . Hypertension   . BPH (benign prostatic hypertrophy)   . Dyslipidemia   . AAA (abdominal aortic aneurysm)   . Hearing loss   . Esophageal cancer   . CAD (coronary artery disease)     Post CABG in 2000.  LIMA to the LAD, SVG to OM, SVG to PDA, last perfusion study in 2007 with no high risk findings    Past Surgical History  Procedure Date  . Coronary artery bypass graft 2000  . Esophagectomy 2000  . Ventral hernia repair 2001    History   Social History  . Marital Status: Married    Spouse Name: N/A    Number of Children: N/A  . Years of Education: N/A   Occupational History  . Not on file.   Social History Main Topics  . Smoking status: Former Smoker    Quit date: 05/25/1997  . Smokeless tobacco: Not on file  . Alcohol Use: No  . Drug Use:   . Sexually Active:    Other Topics Concern  . Not on file   Social History Narrative  . No narrative on file    Current Outpatient Prescriptions on File Prior to Visit  Medication Sig Dispense Refill  . aspirin 325 MG EC tablet Take 325 mg by mouth daily.        Marland Kitchen doxazosin (CARDURA) 4 MG tablet Take 1 tablet (4 mg total) by mouth daily.  90 tablet  3  . furosemide (LASIX) 20 MG tablet Take 1 tablet (20 mg total) by mouth daily.  90 tablet  3  . glucose blood (FREESTYLE LITE) test strip Use as instructed once daily  100 each  11  . lidocaine (LIDODERM) 5 % Place 1 patch onto the skin daily. Remove & Discard patch within 12 hours or as directed by MD      . Melatonin 10 MG TABS Take 1 tablet by mouth as needed.      . metFORMIN (GLUCOPHAGE-XR) 500 MG 24 hr tablet TAKE 1 TABLET BY MOUTH DAILY WITH BREAKFAST.  90 tablet   2  . Multiple Vitamin (MULTIVITAMIN) tablet Take 1 tablet by mouth daily.        Marland Kitchen omeprazole (PRILOSEC) 20 MG capsule Take 2 capsules (40 mg total) by mouth daily.  180 capsule  3  . simvastatin (ZOCOR) 40 MG tablet Take 1 tablet (40 mg total) by mouth at bedtime.  90 tablet  3  . testosterone (TESTIM) 50 MG/5GM GEL Place 5 g onto the skin daily.  5 g  5  . valsartan (DIOVAN) 40 MG tablet Take 1 tablet (40 mg total) by mouth daily.  90 tablet  3    Allergies  Allergen Reactions  . Propoxyphene-Acetaminophen     Family History  Problem Relation Age of Onset  . Heart disease Mother   . Stroke Mother   . Cancer Father     Lymphoma  . Cancer Sister     Uncertain    BP 116/52  Pulse 92  Temp 97.9 F (36.6 C) (Oral)  Ht 6' (1.829 m)  Wt 219 lb (99.338 kg)  BMI 29.70 kg/m2  SpO2 97%    Review of Systems  Constitutional: Negative for fever and unexpected weight change.  HENT: Negative for hearing loss.   Eyes: Negative for visual disturbance.  Respiratory: Negative for shortness of breath.   Cardiovascular: Negative for chest pain.  Genitourinary: Negative for hematuria and difficulty urinating.  Musculoskeletal: Negative for back pain.  Skin: Negative for rash.  Neurological: Negative for syncope, numbness and headaches.  Hematological: Bruises/bleeds easily.  Psychiatric/Behavioral: Negative for dysphoric mood.      Objective:   Physical Exam VS: see vs page GEN: no distress HEAD: head: no deformity eyes: no periorbital swelling, no proptosis external nose and ears are normal mouth: no lesion seen NECK: supple, thyroid is not enlarged.  Old healed surgical scar at the left neck CHEST WALL: no deformity.  Old healed median sternotomy surgical scar LUNGS:  Clear to auscultation BREASTS:  No mass.  No d/c CV: reg rate and rhythm, no murmur ABD: abdomen is soft, nontender.  no hepatosplenomegaly.  not distended.  no hernia.  Old healed surgical scar RECTAL:  normal external and internal exam.  heme neg MUSCULOSKELETAL: muscle bulk and strength are grossly normal.  no obvious joint swelling.  gait is normal and steady EXTEMITIES: no deformity.  no ulcer on the feet.  feet are of normal color and temp.  no edema.  Old healed surgical scars (vein harvest), on the legs.   PULSES: dorsalis pedis intact bilat.  no carotid bruit.   NEURO:  cn 2-12 grossly intact.   readily moves all 4's.  sensation is intact to touch on the feet SKIN:  Normal texture and temperature.  No rash or suspicious lesion is visible.   NODES:  None palpable at the neck.   PSYCH: alert, oriented x3.  Does not appear anxious nor depressed.      Assessment & Plan:  Wellness visit today, with problems stable, except as noted.

## 2012-01-21 NOTE — Patient Instructions (Addendum)
please consider these measures for your health:  minimize alcohol.  do not use tobacco products.  have a colonoscopy at least every 10 years from age 78.   keep firearms safely stored.  always use seat belts.  have working smoke alarms in your home.  see an eye doctor and dentist regularly.  never drive under the influence of alcohol or drugs (including prescription drugs).  those with fair skin should take precautions against the sun. please let me know what your wishes would be, if artificial life support measures should become necessary.  it is critically important to prevent falling down (keep floor areas well-lit, dry, and free of loose objects.  If you have a cane, walker, or wheelchair, you should use it, even for short trips around the house.  Also, try not to rush). Please return in 1 year. (update: we discussed code status.  pt requests full code, but would not want to be started or maintained on artificial life-support measures if there was not a reasonable chance of recovery) 

## 2012-02-16 ENCOUNTER — Other Ambulatory Visit: Payer: Self-pay | Admitting: *Deleted

## 2012-02-16 MED ORDER — OMEPRAZOLE 20 MG PO CPDR: 40.0000 mg | DELAYED_RELEASE_CAPSULE | Freq: Every day | ORAL | Status: DC

## 2012-02-16 MED ORDER — DOXAZOSIN MESYLATE 4 MG PO TABS: 4.0000 mg | ORAL_TABLET | Freq: Every day | ORAL | Status: DC

## 2012-02-16 MED ORDER — SIMVASTATIN 40 MG PO TABS: 40.0000 mg | ORAL_TABLET | Freq: Every day | ORAL | Status: DC

## 2012-02-16 MED ORDER — FUROSEMIDE 20 MG PO TABS: 20.0000 mg | ORAL_TABLET | Freq: Every day | ORAL | Status: DC

## 2012-02-16 NOTE — Telephone Encounter (Signed)
R'cd fax from Simvastatin, Furosemide, Omeprazole, Doxazosin

## 2012-03-01 ENCOUNTER — Telehealth: Payer: Self-pay | Admitting: Endocrinology

## 2012-03-01 NOTE — Telephone Encounter (Signed)
Pt needs prescription for new "foot supports." [employer] is telling him there is paperwork that Dr. Everardo All needs to fill out. The [employer] # is [phone].  Please advise.

## 2012-03-02 NOTE — Telephone Encounter (Signed)
Gerald Martinez, did you get these filed out?

## 2012-03-03 NOTE — Telephone Encounter (Signed)
He does not qualify for either

## 2012-03-03 NOTE — Telephone Encounter (Signed)
Pt states he would like to get rx for inserts even though you stated he does not qualify see paperwork on your desk please advise

## 2012-03-08 NOTE — Telephone Encounter (Signed)
Podiatrist placed the order with [employer] for Orthotics and will follow up with Pt on Thursday regarding this matter.

## 2012-04-04 ENCOUNTER — Telehealth: Payer: Self-pay | Admitting: Endocrinology

## 2012-04-04 ENCOUNTER — Telehealth: Payer: Self-pay

## 2012-04-04 NOTE — Telephone Encounter (Signed)
meds are not called in for pts.  If he wants med, any provider is ok

## 2012-04-04 NOTE — Telephone Encounter (Signed)
Ov needed, any dr

## 2012-04-04 NOTE — Telephone Encounter (Signed)
Pt c/o dry cough for the past 5 days, would like something called in for.  He is taking otc robutussin, diabetic tussin, but it is not helping.  No fever, no runny nose just the dry cough that is driving him crazy.

## 2012-04-04 NOTE — Telephone Encounter (Signed)
Pt states he does not cough all the time, occasionally. OTC cough meds not working, no drainage nor is the cough productive. He states if he needs to increase the amount of diabetic cough, please let him know the amount he can take. Please advise.

## 2012-04-04 NOTE — Telephone Encounter (Signed)
Patient Information:  Caller Name: [place]  Phone: [phone]  Patient: Gerald Martinez, Gerald Martinez  Gender: Male  DOB: 1934/03/23  Age: 78 Years  PCP: Romero Belling (Adults only)   Symptoms  Reason For Call & Symptoms: Cough  Reviewed Health History In EMR: Yes  Reviewed Medications In EMR: Yes  Reviewed Allergies In EMR: Yes  Date of Onset of Symptoms: 03/11/2012  Treatments Tried: Diabetic Cough Syrup and Robitussin Long Lasting Tablets  Treatments Tried Worked: No  Guideline(s) Used:  Cough  Disposition Per Guideline:   See Today or Tomorrow in Office  Reason For Disposition Reached:   Continuous (nonstop) coughing interferes with work or school and no improvement using cough treatment per Care Advice  Advice Given:  Coughing Spasms:  Drink warm fluids. Inhale warm mist&nbsp;(Reason: both relax the airway and loosen up the phlegm).  Coughing Spasms:  Drink warm fluids. Inhale warm mist&nbsp;(Reason: both relax the airway and loosen up the phlegm).  Prevent Dehydration:  Drink adequate liquids.  Office Follow Up:  Does the office need to follow up with this patient?: Yes  Instructions For The Office: Script request and/or appt need RN Note:  Pt states he feels fine but having a dry cough.  Requesting cough medication to be called in.  Nurse advised appt.  Pt states he would come in if MD will not call in cough med.  Requesting to see Dr. Everardo All but prefers cough med to be called in.

## 2012-04-04 NOTE — Telephone Encounter (Signed)
i am not available to see pt.  Any provider is ok

## 2012-04-05 ENCOUNTER — Telehealth: Payer: Self-pay | Admitting: *Deleted

## 2012-04-05 NOTE — Telephone Encounter (Signed)
Called pt and told him that if the cough regiment he is using is not working that he needs to see a provider at the [place] office. Dr. Everardo All does not call in rx for cough medications. Pt understands.

## 2012-04-07 ENCOUNTER — Ambulatory Visit (INDEPENDENT_AMBULATORY_CARE_PROVIDER_SITE_OTHER): Payer: Medicare HMO | Admitting: Endocrinology

## 2012-04-07 ENCOUNTER — Encounter: Payer: Self-pay | Admitting: Endocrinology

## 2012-04-07 VITALS — BP 142/80 | HR 68 | Temp 97.5°F | Wt 223.0 lb

## 2012-04-07 DIAGNOSIS — J069 Acute upper respiratory infection, unspecified: Secondary | ICD-10-CM

## 2012-04-07 MED ORDER — PROMETHAZINE-CODEINE 6.25-10 MG/5ML PO SYRP: 5.0000 mL | ORAL_SOLUTION | ORAL | Status: AC | PRN

## 2012-04-07 MED ORDER — CEFUROXIME AXETIL 250 MG PO TABS: 250.0000 mg | ORAL_TABLET | Freq: Two times a day (BID) | ORAL | Status: AC

## 2012-04-07 NOTE — Patient Instructions (Addendum)
Here are 2 prescriptions: cough syrup and antibiotic I hope you feel better soon.  If you don't feel better by next week, please call back.  Please call sooner if you get worse.  

## 2012-04-07 NOTE — Progress Notes (Signed)
Subjective:    Patient ID: Gerald Martinez, male    DOB: 1933/08/05, 78 y.o.   MRN: [id number]  HPI Pt states 9 days of dry-quality cough in the chest, and assoc slight sore throat.  Denies nasal congestion and earache.   Past Medical History  Diagnosis Date  . Diabetes mellitus     Type II  . Hypertension   . BPH (benign prostatic hypertrophy)   . Dyslipidemia   . AAA (abdominal aortic aneurysm)   . Hearing loss   . Esophageal cancer   . CAD (coronary artery disease)     Post CABG in 2000.  LIMA to the LAD, SVG to OM, SVG to PDA, last perfusion study in 2007 with no high risk findings    Past Surgical History  Procedure Date  . Coronary artery bypass graft 2000  . Esophagectomy 2000  . Ventral hernia repair 2001    History   Social History  . Marital Status: Married    Spouse Name: N/A    Number of Children: N/A  . Years of Education: N/A   Occupational History  . Not on file.   Social History Main Topics  . Smoking status: Former Smoker    Quit date: 05/25/1997  . Smokeless tobacco: Not on file  . Alcohol Use: No  . Drug Use:   . Sexually Active:    Other Topics Concern  . Not on file   Social History Narrative  . No narrative on file    Current Outpatient Prescriptions on File Prior to Visit  Medication Sig Dispense Refill  . aspirin 325 MG EC tablet Take 325 mg by mouth daily.        Marland Kitchen doxazosin (CARDURA) 4 MG tablet Take 1 tablet (4 mg total) by mouth daily.  90 tablet  3  . furosemide (LASIX) 20 MG tablet Take 1 tablet (20 mg total) by mouth daily.  90 tablet  3  . glucose blood (FREESTYLE LITE) test strip Use as instructed once daily  100 each  11  . lidocaine (LIDODERM) 5 % Place 1 patch onto the skin daily. Remove & Discard patch within 12 hours or as directed by MD      . Melatonin 10 MG TABS Take 1 tablet by mouth as needed.      . metFORMIN (GLUCOPHAGE-XR) 500 MG 24 hr tablet TAKE 1 TABLET BY MOUTH DAILY WITH BREAKFAST.  90 tablet  2  . Multiple  Vitamin (MULTIVITAMIN) tablet Take 1 tablet by mouth daily.        Marland Kitchen omeprazole (PRILOSEC) 20 MG capsule Take 2 capsules (40 mg total) by mouth daily.  180 capsule  3  . simvastatin (ZOCOR) 40 MG tablet Take 1 tablet (40 mg total) by mouth at bedtime.  90 tablet  3  . testosterone (TESTIM) 50 MG/5GM GEL Place 5 g onto the skin daily.  5 g  5  . valsartan (DIOVAN) 40 MG tablet Take 1 tablet (40 mg total) by mouth daily.  90 tablet  3    Allergies  Allergen Reactions  . Propoxyphene-Acetaminophen     Family History  Problem Relation Age of Onset  . Heart disease Mother   . Stroke Mother   . Cancer Father     Lymphoma  . Cancer Sister     Uncertain    BP 142/80  Pulse 68  Temp 97.5 F (36.4 C) (Oral)  Wt 223 lb (101.152 kg)  SpO2 97%  Review of Systems Denies sob and fever.      Objective:   Physical Exam VIIAL SIGNS:  See vs page GENERAL: no distress head: no deformity eyes: no periorbital swelling, no proptosis external nose and ears are normal mouth: no lesion seen Right tm is red.  Left is obscured by cerumen       Assessment & Plan:  URI, new

## 2012-04-19 ENCOUNTER — Other Ambulatory Visit: Payer: Self-pay | Admitting: Endocrinology

## 2012-04-20 ENCOUNTER — Telehealth: Payer: Self-pay | Admitting: *Deleted

## 2012-04-20 ENCOUNTER — Other Ambulatory Visit: Payer: Self-pay | Admitting: *Deleted

## 2012-04-20 DIAGNOSIS — E299 Testicular dysfunction, unspecified: Secondary | ICD-10-CM

## 2012-04-20 MED ORDER — TESTOSTERONE 50 MG/5GM (1%) TD GEL: 5.0000 g | Freq: Every day | TRANSDERMAL | Status: DC

## 2012-04-20 NOTE — Telephone Encounter (Signed)
CAN YOU PLEASE PRINT AND SIGN REFILL REQUEST FOR PATIENT MEDICATION TETIM 50MG /5GM TO BE FAXED OR CALLED TO PHARMACY. THANK YOU SUE

## 2012-04-20 NOTE — Telephone Encounter (Signed)
Medication faxed to [facility] pharmacy for Testim.

## 2012-04-20 NOTE — Telephone Encounter (Signed)
Done, placed it on your desk.

## 2012-06-17 ENCOUNTER — Telehealth: Payer: Self-pay

## 2012-06-17 ENCOUNTER — Other Ambulatory Visit: Payer: Self-pay

## 2012-06-17 NOTE — Telephone Encounter (Signed)
meds can be refilled prn

## 2012-06-17 NOTE — Telephone Encounter (Signed)
Pt wondering if he is is due for a f/u or labs since he is only getting a 30 day refill of his medication.

## 2012-06-30 ENCOUNTER — Telehealth: Payer: Self-pay | Admitting: Endocrinology

## 2012-06-30 ENCOUNTER — Telehealth: Payer: Self-pay

## 2012-06-30 MED ORDER — LOSARTAN POTASSIUM 50 MG PO TABS: 50.0000 mg | ORAL_TABLET | Freq: Every day | ORAL | Status: DC

## 2012-06-30 NOTE — Telephone Encounter (Signed)
Pt states he would like to go ahead and get a different rx sent in to pharmacy

## 2012-06-30 NOTE — Telephone Encounter (Signed)
i changed, and sent rx

## 2012-06-30 NOTE — Telephone Encounter (Signed)
i would be happy to change to cozaar--much cheaper. Please let me know

## 2012-06-30 NOTE — Telephone Encounter (Signed)
PATIENT NOTIFIED OF CHANGE IN MEDICATION.

## 2012-06-30 NOTE — Telephone Encounter (Signed)
Pt is having issues with insurance covering a 90 day supply of Diovan. A 30 day supply costs $70.00. Pt wants to know if a generic is available? The pt says he CAN afford the $70.00 per month if a generic is not available or if we can not offer something comparable. He is pleased with how Diovan works and does not want to substitute the med unless he can get something that provides similar results. CB# [id number].

## 2012-06-30 NOTE — Telephone Encounter (Signed)
Left message for pt, pt should let us know

## 2012-07-13 ENCOUNTER — Other Ambulatory Visit: Payer: Self-pay | Admitting: Endocrinology

## 2012-07-13 MED ORDER — ESOMEPRAZOLE MAGNESIUM 40 MG PO CPDR: 40.0000 mg | DELAYED_RELEASE_CAPSULE | Freq: Every day | ORAL | Status: DC

## 2012-07-22 ENCOUNTER — Telehealth: Payer: Self-pay | Admitting: *Deleted

## 2012-07-22 DIAGNOSIS — E299 Testicular dysfunction, unspecified: Secondary | ICD-10-CM

## 2012-07-22 NOTE — Telephone Encounter (Signed)
See note below

## 2012-07-22 NOTE — Telephone Encounter (Signed)
PATIENT CALLED TO SAY HE DID NOT WANT TO CHANGE HIS MEDICATION JUST BECAUSE INSURANCE REQUIRES PRIOR AUTHORIZATION. PATIENT STATES HE PREFERS TO STAY ON THE OMEPRAZOLE THAT HAS ALWAYS WORKED FOR HIM. HE HAS TRIED NEXIUM BEFORE AND DOES NOT HELP. PLEASE ADVISE. PATIENT STATES DOES NOT MIND PAYING THE DIFFERENCE IF THIS IS WHAT WORKS FOR HIM. STATES HE DID NOT LIKE IT THAT MEDICATION WAS CHANGED AND HE WAS NOT INFORMED OF THIS.

## 2012-07-24 NOTE — Telephone Encounter (Signed)
Ok.  It is cheaper to just buy it otc

## 2012-07-25 MED ORDER — TESTOSTERONE 50 MG/5GM (1%) TD GEL: 5.0000 g | Freq: Every day | TRANSDERMAL | Status: DC

## 2012-07-25 NOTE — Telephone Encounter (Signed)
Pt does not want any of his meds changed unless he is contacted first

## 2012-09-21 ENCOUNTER — Encounter: Payer: Medicare HMO | Admitting: Endocrinology

## 2012-10-06 ENCOUNTER — Encounter: Payer: Self-pay | Admitting: Internal Medicine

## 2012-10-11 ENCOUNTER — Encounter: Payer: Self-pay | Admitting: Internal Medicine

## 2012-11-11 ENCOUNTER — Other Ambulatory Visit: Payer: Self-pay | Admitting: Endocrinology

## 2012-12-19 ENCOUNTER — Other Ambulatory Visit: Payer: Self-pay | Admitting: Endocrinology

## 2012-12-19 ENCOUNTER — Other Ambulatory Visit: Payer: Self-pay | Admitting: *Deleted

## 2012-12-19 MED ORDER — GLUCOSE BLOOD VI STRP: ORAL_STRIP | Status: DC

## 2012-12-30 ENCOUNTER — Encounter: Payer: Self-pay | Admitting: Internal Medicine

## 2012-12-30 ENCOUNTER — Ambulatory Visit (AMBULATORY_SURGERY_CENTER): Payer: Medicare HMO

## 2012-12-30 VITALS — Ht 72.0 in | Wt 222.0 lb

## 2012-12-30 DIAGNOSIS — Z860100 Personal history of colon polyps, unspecified: Secondary | ICD-10-CM

## 2012-12-30 MED ORDER — MOVIPREP 100 G PO SOLR: 1.0000 | Freq: Once | ORAL | Status: DC

## 2013-01-06 ENCOUNTER — Other Ambulatory Visit: Payer: Self-pay | Admitting: Endocrinology

## 2013-01-06 ENCOUNTER — Encounter: Payer: Self-pay | Admitting: Cardiology

## 2013-01-06 ENCOUNTER — Ambulatory Visit (INDEPENDENT_AMBULATORY_CARE_PROVIDER_SITE_OTHER): Payer: Medicare HMO | Admitting: Cardiology

## 2013-01-06 VITALS — BP 122/64 | HR 67 | Ht 72.0 in | Wt 221.6 lb

## 2013-01-06 DIAGNOSIS — I714 Abdominal aortic aneurysm, without rupture: Secondary | ICD-10-CM

## 2013-01-06 DIAGNOSIS — I2510 Atherosclerotic heart disease of native coronary artery without angina pectoris: Secondary | ICD-10-CM

## 2013-01-06 MED ORDER — TESTOSTERONE 50 MG/5GM (1%) TD GEL: 5.0000 g | Freq: Every day | TRANSDERMAL | Status: DC

## 2013-01-06 NOTE — Progress Notes (Signed)
HPI Patient presents for followup of his known coronary disease. Since I last saw him he has had no new cardiovascular complaints. He remains active. He walks to graveyard doing genealogy. He exercises at the [facility] With this he denies any chest pressure, neck or arm discomfort. He's had no palpitations, presyncope or syncope. He has had no PND or orthopnea. He has had no new symptoms since he had a stress perfusion study in 2012.  Allergies  Allergen Reactions  . Propoxyphene-Acetaminophen     Current Outpatient Prescriptions  Medication Sig Dispense Refill  . aspirin 325 MG EC tablet Take 325 mg by mouth daily.        Marland Kitchen doxazosin (CARDURA) 4 MG tablet Take 1 tablet (4 mg total) by mouth daily.  90 tablet  3  . furosemide (LASIX) 20 MG tablet Take 1 tablet (20 mg total) by mouth daily.  90 tablet  3  . glucose blood (FREESTYLE LITE) test strip Use as instructed once daily  100 each  11  . lidocaine (LIDODERM) 5 % Place 1 patch onto the skin daily. Remove & Discard patch within 12 hours or as directed by MD      . Melatonin 10 MG TABS Take 1 tablet by mouth as needed.      . metFORMIN (GLUCOPHAGE-XR) 500 MG 24 hr tablet TAKE 1 TABLET BY MOUTH DAILY WITH BREAKFAST.  90 tablet  3  . Multiple Vitamin (MULTIVITAMIN) tablet Take 1 tablet by mouth daily.        Marland Kitchen omeprazole (PRILOSEC) 20 MG capsule Take 40 mg by mouth daily.       . simvastatin (ZOCOR) 40 MG tablet Take 1 tablet (40 mg total) by mouth at bedtime.  90 tablet  3  . testosterone (TESTIM) 50 MG/5GM GEL Place 5 g onto the skin daily.  150 g  0   No current facility-administered medications for this visit.    Past Medical History  Diagnosis Date  . Diabetes mellitus     Type II  . Hypertension   . BPH (benign prostatic hypertrophy)   . Dyslipidemia   . AAA (abdominal aortic aneurysm)   . Hearing loss   . Esophageal cancer   . CAD (coronary artery disease)     Post CABG in 2000.  LIMA to the LAD, SVG to OM, SVG to PDA,  last perfusion study in 2007 with no high risk findings    Past Surgical History  Procedure Laterality Date  . Coronary artery bypass graft  2000  . Esophagectomy  2000  . Ventral hernia repair  2001    ROS:  Back and leg problems. As stated in the HPI and negative for all other systems.  PHYSICAL EXAM BP 122/64  Pulse 67  Ht 6' (1.829 m)  Wt 221 lb 9.6 oz (100.517 kg)  BMI 30.05 kg/m2 GENERAL:  Well appearing  NECK:  No jugular venous distention, waveform within normal limits, carotid upstroke brisk and symmetric, no bruits, no thyromegaly LUNGS:  Clear to auscultation bilaterally BACK:  No CVA tenderness CHEST:  Well healed sternotomy scar. HEART:  PMI not displaced or sustained,S1 and S2 within normal limits, no S3, no S4, no clicks, no rubs, no murmurs ABD:  Flat, positive bowel sounds normal in frequency in pitch, no bruits, no rebound, no guarding, no midline pulsatile mass, no hepatomegaly, no splenomegaly, nonhealing abdominal wounds EXT:  2 plus pulses throughout, no edema, no cyanosis no clubbing, venous stasis changes  EKG:  Sinus rhythm, rate 67, axis within normal limits, intervals within normal limits, no acute ST-T wave changes.  01/06/2013   ASSESSMENT AND PLAN  CORONARY ATHEROSCLEROSIS NATIVE CORONARY ARTERY -  He has had no new symptoms since his stress test in 2012. No further cardiovascular testing is suggested. She will continue with risk reduction.  HYPERTENSION -  The blood pressure is at target. No change in medications is indicated. We will continue with therapeutic lifestyle changes (TLC).  HYPERCHOLESTEROLEMIA -  He will have this followed up again when he sees Romero Belling, MD  Overweight -  The patient understands the need to lose weight with diet and exercise.   AAA - He has not had follow up of this in some time.  I will schedule Doppler.

## 2013-01-06 NOTE — Patient Instructions (Addendum)
The current medical regimen is effective;  continue present plan and medications.  Your physician has requested that you have an abdominal aorta duplex. During this test, an ultrasound is used to evaluate the aorta. Allow 30 minutes for this exam. Do not eat after midnight the day before and avoid carbonated beverages  Follow up in 1 year with Dr Hochrein.  You will receive a letter in the mail 2 months before you are due.  Please call us when you receive this letter to schedule your follow up appointment.  

## 2013-01-10 ENCOUNTER — Ambulatory Visit (AMBULATORY_SURGERY_CENTER): Payer: Medicare HMO | Admitting: Internal Medicine

## 2013-01-10 ENCOUNTER — Encounter: Payer: Self-pay | Admitting: Internal Medicine

## 2013-01-10 VITALS — BP 125/69 | HR 60 | Temp 96.6°F | Resp 29 | Ht 72.0 in | Wt 222.0 lb

## 2013-01-10 DIAGNOSIS — Z86010 Personal history of colonic polyps: Secondary | ICD-10-CM

## 2013-01-10 DIAGNOSIS — K5730 Diverticulosis of large intestine without perforation or abscess without bleeding: Secondary | ICD-10-CM

## 2013-01-10 MED ORDER — SODIUM CHLORIDE 0.9 % IV SOLN: 500.0000 mL | INTRAVENOUS | Status: DC

## 2013-01-10 NOTE — Progress Notes (Signed)
Patient did not have preoperative order for IV antibiotic SSI prophylaxis. (G8918)  Patient did not experience any of the following events: a burn prior to discharge; a fall within the facility; wrong site/side/patient/procedure/implant event; or a hospital transfer or hospital admission upon discharge from the facility. (G8907)  

## 2013-01-10 NOTE — Patient Instructions (Addendum)
YOU HAD AN ENDOSCOPIC PROCEDURE TODAY AT THE [facility]: Refer to the procedure report that was given to you for any specific questions about what was found during the examination.  If the procedure report does not answer your questions, please call your gastroenterologist to clarify.  If you requested that your care partner not be given the details of your procedure findings, then the procedure report has been included in a sealed envelope for you to review at your convenience later.  YOU SHOULD EXPECT: Some feelings of bloating in the abdomen. Passage of more gas than usual.  Walking can help get rid of the air that was put into your GI tract during the procedure and reduce the bloating. If you had a lower endoscopy (such as a colonoscopy or flexible sigmoidoscopy) you may notice spotting of blood in your stool or on the toilet paper. If you underwent a bowel prep for your procedure, then you may not have a normal bowel movement for a few days.  DIET: Your first meal following the procedure should be a light meal and then it is ok to progress to your normal diet.  A half-sandwich or bowl of soup is an example of a good first meal.  Heavy or fried foods are harder to digest and may make you feel nauseous or bloated.  Likewise meals heavy in dairy and vegetables can cause extra gas to form and this can also increase the bloating.  Drink plenty of fluids but you should avoid alcoholic beverages for 24 hours.  ACTIVITY: Your care partner should take you home directly after the procedure.  You should plan to take it easy, moving slowly for the rest of the day.  You can resume normal activity the day after the procedure however you should NOT DRIVE or use heavy machinery for 24 hours (because of the sedation medicines used during the test).    SYMPTOMS TO REPORT IMMEDIATELY: A gastroenterologist can be reached at any hour.  During normal business hours, 8:30 AM to 5:00 PM Monday through Friday,  call [phone].  After hours and on weekends, please call the GI answering service at [phone] who will take a message and have the physician on call contact you.   Following lower endoscopy (colonoscopy or flexible sigmoidoscopy):  Excessive amounts of blood in the stool  Significant tenderness or worsening of abdominal pains  Swelling of the abdomen that is new, acute  Fever of 100F or higher FOLLOW UP: If any biopsies were taken you will be contacted by phone or by letter within the next 1-3 weeks.  Call your gastroenterologist if you have not heard about the biopsies in 3 weeks.  Our staff will call the home number listed on your records the next business day following your procedure to check on you and address any questions or concerns that you may have at that time regarding the information given to you following your procedure. This is a courtesy call and so if there is no answer at the home number and we have not heard from you through the emergency physician on call, we will assume that you have returned to your regular daily activities without incident.  SIGNATURES/CONFIDENTIALITY: You and/or your care partner have signed paperwork which will be entered into your electronic medical record.  These signatures attest to the fact that that the information above on your After Visit Summary has been reviewed and is understood.  Full responsibility of the confidentiality of this discharge   information lies with you and/or your care-partner.  Recommendations Return to the care of your primary provider. GI follow up as needed.  

## 2013-01-10 NOTE — Op Note (Signed)
[facility] [address] [place], [address]   COLONOSCOPY PROCEDURE REPORT  PATIENT: Gerald, Martinez  MR#: [id number] BIRTHDATE: 1933/05/31 , 78  yrs. old GENDER: Male ENDOSCOPIST: Roxy Cedar, MD REFERRED [id number] Program Recall PROCEDURE DATE:  01/10/2013 PROCEDURE:   Colonoscopy, surveillance First Screening Colonoscopy - Avg.  risk and is 50 yrs.  old or older - No.  Prior Negative Screening - Now for repeat screening. N/A  History of Adenoma - Now for follow-up colonoscopy & has been > or = to 3 yrs.  Yes hx of adenoma.  Has been 3 or more years since last colonoscopy.  Polyps Removed Today? No.  Recommend repeat exam, <10 yrs? No. ASA CLASS:   Class III INDICATIONS:Patient's personal history of adenomatous colon polyps. index exam 2001. Followup exam 2004 and 2009. MEDICATIONS: MAC sedation, administered by CRNA and propofol (Diprivan) 200mg  IV  DESCRIPTION OF PROCEDURE:   After the risks benefits and alternatives of the procedure were thoroughly explained, informed consent was obtained.  A digital rectal exam revealed no abnormalities of the rectum.   The LB [id number]  endoscope was introduced through the anus and advanced to the cecum, which was identified by both the appendix and ileocecal valve. No adverse events experienced.   The quality of the prep was adequate, using MoviPrep  The instrument was then slowly withdrawn as the colon was fully examined.    COLON FINDINGS: Severe diverticulosis was noted throughout the entire examined colon.   The colon was otherwise normal.  There was no inflammation, polyps or cancers .  Retroflexed views revealed no abnormalities. The time to cecum=4 minutes 30 seconds.  Withdrawal time=11 minutes 11 seconds.  The scope was withdrawn and the procedure completed.  COMPLICATIONS: There were no complications.  ENDOSCOPIC IMPRESSION: 1.   Severe diverticulosis was noted throughout the entire  examined colon 2.   The colon was otherwise normal  RECOMMENDATIONS: 1. Return to the care of your primary provider.  GI follow up as needed   eSigned:  Roxy Cedar, MD 01/10/2013 8:45 AM   cc: Minus Breeding, MD and The Patient   PATIENT NAME:  Gerald, Martinez MR#: [id number]

## 2013-01-11 ENCOUNTER — Telehealth: Payer: Self-pay | Admitting: *Deleted

## 2013-01-11 NOTE — Telephone Encounter (Signed)
  Follow up Call-  Call back number 01/10/2013  Post procedure Call Back phone  # [phone]  Permission to leave phone message Yes     Patient questions:  Do you have a fever, pain , or abdominal swelling? no Pain Score  0 *  Have you tolerated food without any problems? yes  Have you been able to return to your normal activities? yes  Do you have any questions about your discharge instructions: Diet   no Medications  no Follow up visit  no  Do you have questions or concerns about your Care? no  Actions: * If pain score is 4 or above: No action needed, pain <4.

## 2013-01-17 ENCOUNTER — Encounter (INDEPENDENT_AMBULATORY_CARE_PROVIDER_SITE_OTHER): Payer: Medicare HMO

## 2013-01-17 DIAGNOSIS — I714 Abdominal aortic aneurysm, without rupture: Secondary | ICD-10-CM

## 2013-01-17 DIAGNOSIS — I700 Atherosclerosis of aorta: Secondary | ICD-10-CM

## 2013-01-24 ENCOUNTER — Other Ambulatory Visit: Payer: Self-pay | Admitting: Endocrinology

## 2013-01-24 ENCOUNTER — Encounter: Payer: Self-pay | Admitting: Endocrinology

## 2013-01-24 ENCOUNTER — Ambulatory Visit (INDEPENDENT_AMBULATORY_CARE_PROVIDER_SITE_OTHER): Payer: Medicare HMO | Admitting: Endocrinology

## 2013-01-24 VITALS — BP 136/70 | HR 67 | Wt 220.0 lb

## 2013-01-24 DIAGNOSIS — I10 Essential (primary) hypertension: Secondary | ICD-10-CM

## 2013-01-24 DIAGNOSIS — Z125 Encounter for screening for malignant neoplasm of prostate: Secondary | ICD-10-CM

## 2013-01-24 DIAGNOSIS — Z0000 Encounter for general adult medical examination without abnormal findings: Secondary | ICD-10-CM

## 2013-01-24 DIAGNOSIS — E119 Type 2 diabetes mellitus without complications: Secondary | ICD-10-CM

## 2013-01-24 DIAGNOSIS — Z79899 Other long term (current) drug therapy: Secondary | ICD-10-CM

## 2013-01-24 DIAGNOSIS — E7800 Pure hypercholesterolemia, unspecified: Secondary | ICD-10-CM

## 2013-01-24 DIAGNOSIS — D509 Iron deficiency anemia, unspecified: Secondary | ICD-10-CM

## 2013-01-24 DIAGNOSIS — E299 Testicular dysfunction, unspecified: Secondary | ICD-10-CM

## 2013-01-24 LAB — CBC WITH DIFFERENTIAL/PLATELET
Basophils Relative: 0.6 % (ref 0.0–3.0)
Eosinophils Relative: 2.1 % (ref 0.0–5.0)
HCT: 43.1 % (ref 39.0–52.0)
Hemoglobin: 14.5 g/dL (ref 13.0–17.0)
Lymphs Abs: 1.6 10*3/uL (ref 0.7–4.0)
MCV: 87.8 fl (ref 78.0–100.0)
Monocytes Absolute: 0.6 10*3/uL (ref 0.1–1.0)
RBC: 4.91 Mil/uL (ref 4.22–5.81)
WBC: 6.1 10*3/uL (ref 4.5–10.5)

## 2013-01-24 LAB — BASIC METABOLIC PANEL
Chloride: 97 mEq/L (ref 96–112)
Creatinine, Ser: 0.9 mg/dL (ref 0.4–1.5)
GFR: 87.01 mL/min (ref >60.00)
Potassium: 4.2 mEq/L (ref 3.5–5.1)

## 2013-01-24 LAB — URINALYSIS, ROUTINE W REFLEX MICROSCOPIC
Bilirubin Urine: NEGATIVE
Ketones, ur: NEGATIVE
Nitrite: NEGATIVE
Urobilinogen, UA: 0.2 (ref 0.0–1.0)

## 2013-01-24 LAB — LIPID PANEL
LDL Cholesterol: 74 mg/dL (ref 0–99)
Total CHOL/HDL Ratio: 3
Triglycerides: 120 mg/dL (ref 0.0–149.0)
VLDL: 24 mg/dL (ref 0.0–40.0)

## 2013-01-24 LAB — HEPATIC FUNCTION PANEL
ALT: 25 U/L (ref 0–53)
AST: 25 U/L (ref 0–37)
Alkaline Phosphatase: 54 U/L (ref 39–117)
Bilirubin, Direct: 0.1 mg/dL (ref 0.0–0.3)
Total Bilirubin: 0.7 mg/dL (ref 0.3–1.2)

## 2013-01-24 LAB — TSH: TSH: 3.1 u[IU]/mL (ref 0.35–5.50)

## 2013-01-24 LAB — HEMOGLOBIN A1C: Hgb A1c MFr Bld: 6.6 % — ABNORMAL HIGH (ref 4.6–6.5)

## 2013-01-24 LAB — TESTOSTERONE: Testosterone: 305.73 ng/dL — ABNORMAL LOW (ref 350.00–890.00)

## 2013-01-24 NOTE — Patient Instructions (Signed)
please consider these measures for your health:  minimize alcohol.  do not use tobacco products.  have a colonoscopy at least every 10 years from age 79.  keep firearms safely stored.  always use seat belts.  have working smoke alarms in your home.  see an eye doctor and dentist regularly.  never drive under the influence of alcohol or drugs (including prescription drugs).  those with fair skin should take precautions against the sun. it is critically important to prevent falling down (keep floor areas well-lit, dry, and free of loose objects.  If you have a cane, walker, or wheelchair, you should use it, even for short trips around the house.  Also, try not to rush). Please come back for a follow-up appointment in 6 months.   blood tests are being requested for you today.  We'll contact you with results.

## 2013-01-24 NOTE — Progress Notes (Signed)
Subjective:    Patient ID: Gerald Martinez, male    DOB: [birth year], 79 y.o.   MRN: [id number]  HPI Pt is here for regular wellness examination, and is feeling pretty well in general, and says chronic med probs are stable, except as noted below Past Medical History  Diagnosis Date  . Diabetes mellitus     Type II  . Hypertension   . BPH (benign prostatic hypertrophy)   . Dyslipidemia   . AAA (abdominal aortic aneurysm)   . Hearing loss   . Esophageal cancer   . CAD (coronary artery disease)     Post CABG in 2000.  LIMA to the LAD, SVG to OM, SVG to PDA, last perfusion study in 2007 with no high risk findings    Past Surgical History  Procedure Laterality Date  . Coronary artery bypass graft  2000  . Esophagectomy  2000  . Ventral hernia repair  2001    History   Social History  . Marital Status: Married    Spouse Name: N/A    Number of Children: N/A  . Years of Education: N/A   Occupational History  . Not on file.   Social History Main Topics  . Smoking status: Former Smoker    Quit date: 05/25/1997  . Smokeless tobacco: Never Used  . Alcohol Use: 3.6 oz/week    6 Glasses of wine per week  . Drug Use: No  . Sexual Activity: Not on file   Other Topics Concern  . Not on file   Social History Narrative  . No narrative on file    Current Outpatient Prescriptions on File Prior to Visit  Medication Sig Dispense Refill  . aspirin 325 MG EC tablet Take 325 mg by mouth daily.        Marland Kitchen doxazosin (CARDURA) 4 MG tablet Take 1 tablet (4 mg total) by mouth daily.  90 tablet  3  . furosemide (LASIX) 20 MG tablet Take 1 tablet (20 mg total) by mouth daily.  90 tablet  3  . glucose blood (FREESTYLE LITE) test strip Use as instructed once daily  100 each  11  . lidocaine (LIDODERM) 5 % Place 1 patch onto the skin daily. Remove & Discard patch within 12 hours or as directed by MD      . Melatonin 10 MG TABS Take 1 tablet by mouth as needed.      . metFORMIN (GLUCOPHAGE-XR)  500 MG 24 hr tablet TAKE 1 TABLET BY MOUTH DAILY WITH BREAKFAST.  90 tablet  3  . Multiple Vitamin (MULTIVITAMIN) tablet Take 1 tablet by mouth daily.        Marland Kitchen omeprazole (PRILOSEC) 20 MG capsule Take 40 mg by mouth daily.       . simvastatin (ZOCOR) 40 MG tablet Take 1 tablet (40 mg total) by mouth at bedtime.  90 tablet  3  . testosterone (TESTIM) 50 MG/5GM GEL Place 5 g onto the skin daily.  150 g  0   No current facility-administered medications on file prior to visit.    Allergies  Allergen Reactions  . Propoxyphene-Acetaminophen     Family History  Problem Relation Age of Onset  . Heart disease Mother   . Stroke Mother   . Cancer Father     Lymphoma  . Cancer Sister     Uncertain  . Colon cancer Neg Hx     BP 136/70  Pulse 67  Wt 220 lb (99.791 kg)  BMI  29.83 kg/m2  SpO2 95%     Review of Systems  Constitutional: Negative for fever and unexpected weight change.  HENT: Negative for hearing loss.   Eyes: Negative for visual disturbance.  Respiratory: Negative for shortness of breath.   Cardiovascular: Negative for chest pain.  Gastrointestinal: Negative for anal bleeding.  Endocrine: Negative for cold intolerance.  Genitourinary: Negative for hematuria.  Musculoskeletal: Positive for back pain.  Skin: Negative for rash.  Allergic/Immunologic: Negative for environmental allergies.  Neurological: Negative for syncope.  Hematological: Bruises/bleeds easily.  Psychiatric/Behavioral: Negative for dysphoric mood.       Objective:   Physical Exam VS: see vs page GEN: no distress HEAD: head: no deformity eyes: no periorbital swelling, no proptosis external nose and ears are normal mouth: no lesion seen NECK: a healed left sided scar is present.  i do not appreciate a nodule in the thyroid or elsewhere in the neck CHEST WALL: no deformity LUNGS: clear to auscultation BREASTS:  No gynecomastia CV: reg rate and rhythm, no murmur ABD: abdomen is soft,  nontender.  no hepatosplenomegaly.  not distended.  Old healed surgical scar, and self-reducing ventral hernia RECTAL: normal external and internal exam.  heme neg. PROSTATE:  Normal size.  No nodule MUSCULOSKELETAL: muscle bulk and strength are grossly normal.  no obvious joint swelling.  gait is normal and steady.   PULSES: no carotid bruit NEURO:  cn 2-12 grossly intact.   readily moves all 4's.   SKIN:  Normal texture and temperature.  No rash or suspicious lesion is visible.   NODES:  None palpable at the neck PSYCH: alert, oriented x3.  Does not appear anxious nor depressed.       Assessment & Plan:  Wellness visit today, with problems stable, except as noted.  we discussed code status.  pt requests full code, but would not want to be started or maintained on artificial life-support measures if there was not a reasonable chance of recovery     SEPARATE EVALUATION FOLLOWS--EACH PROBLEM HERE IS NEW, NOT RESPONDING TO TREATMENT, OR POSES SIGNIFICANT RISK TO THE PATIENT'S HEALTH: HISTORY OF THE PRESENT ILLNESS: Hyponatremia is noted today, new finding.  He denies numbness. He takes testim for hypogonadism.  He says it works well PAST MEDICAL HISTORY reviewed and up to date today REVIEW OF SYSTEMS: Denies decreased urinary stream PHYSICAL EXAMINATION: VITAL SIGNS:  See vs page GENERAL: no distress 1+ bilat leg edema. LAB/XRAY RESULTS: Lab Results  Component Value Date   WBC 6.1 01/24/2013   HGB 14.5 01/24/2013   HCT 43.1 01/24/2013   PLT 173.0 01/24/2013   GLUCOSE 83 01/24/2013   CHOL 139 01/24/2013   TRIG 120.0 01/24/2013   HDL 41.00 01/24/2013   LDLCALC 74 01/24/2013   ALT 25 01/24/2013   AST 25 01/24/2013   NA 132* 01/24/2013   K 4.2 01/24/2013   CL 97 01/24/2013   CREATININE 0.9 01/24/2013   BUN 11 01/24/2013   CO2 31 01/24/2013   TSH 3.10 01/24/2013   PSA 2.38 01/24/2013   HGBA1C 6.6* 01/24/2013   MICROALBUR 0.4 01/24/2013   Lab Results  Component Value Date   TESTOSTERONE 305.73* 01/24/2013   IMPRESSION: Hyponatremia, new, possibly contributed to by lasix Hypogonadism, possibly exac by noncompliance PLAN: See instruction page

## 2013-02-11 ENCOUNTER — Encounter: Payer: Self-pay | Admitting: Endocrinology

## 2013-02-11 ENCOUNTER — Other Ambulatory Visit: Payer: Self-pay | Admitting: Endocrinology

## 2013-02-13 ENCOUNTER — Other Ambulatory Visit: Payer: Self-pay

## 2013-02-13 ENCOUNTER — Other Ambulatory Visit: Payer: Self-pay | Admitting: Endocrinology

## 2013-02-13 ENCOUNTER — Other Ambulatory Visit: Payer: Self-pay | Admitting: *Deleted

## 2013-02-13 MED ORDER — FUROSEMIDE 20 MG PO TABS: 20.0000 mg | ORAL_TABLET | Freq: Every day | ORAL | Status: DC

## 2013-02-13 MED ORDER — DOXAZOSIN MESYLATE 4 MG PO TABS: 4.0000 mg | ORAL_TABLET | Freq: Every day | ORAL | Status: DC

## 2013-02-13 MED ORDER — SIMVASTATIN 40 MG PO TABS: 40.0000 mg | ORAL_TABLET | Freq: Every day | ORAL | Status: DC

## 2013-02-13 MED ORDER — TESTOSTERONE 50 MG/5GM (1%) TD GEL: TRANSDERMAL | Status: DC

## 2013-02-24 ENCOUNTER — Other Ambulatory Visit: Payer: Medicare HMO

## 2013-02-24 ENCOUNTER — Ambulatory Visit: Payer: Medicare HMO

## 2013-02-27 ENCOUNTER — Ambulatory Visit (INDEPENDENT_AMBULATORY_CARE_PROVIDER_SITE_OTHER): Payer: Medicare HMO

## 2013-02-27 ENCOUNTER — Telehealth: Payer: Self-pay

## 2013-02-27 ENCOUNTER — Ambulatory Visit (INDEPENDENT_AMBULATORY_CARE_PROVIDER_SITE_OTHER): Payer: Medicare HMO | Admitting: *Deleted

## 2013-02-27 ENCOUNTER — Other Ambulatory Visit: Payer: Self-pay | Admitting: Endocrinology

## 2013-02-27 DIAGNOSIS — E299 Testicular dysfunction, unspecified: Secondary | ICD-10-CM

## 2013-02-27 DIAGNOSIS — E119 Type 2 diabetes mellitus without complications: Secondary | ICD-10-CM

## 2013-02-27 DIAGNOSIS — Z23 Encounter for immunization: Secondary | ICD-10-CM

## 2013-02-27 NOTE — Telephone Encounter (Signed)
elam

## 2013-02-27 NOTE — Telephone Encounter (Signed)
Yes, please do the test.  Please let me know if you want to do here or [facility]

## 2013-02-27 NOTE — Telephone Encounter (Signed)
Pt left voicemail stating he went to [place] on Friday no orders in computer pt wanted to know if he should go today

## 2013-02-27 NOTE — Telephone Encounter (Signed)
i have ordered.  Sorry about the mix-up.

## 2013-02-28 ENCOUNTER — Encounter: Payer: Self-pay | Admitting: Endocrinology

## 2013-02-28 LAB — URINALYSIS, ROUTINE W REFLEX MICROSCOPIC
Leukocytes, UA: NEGATIVE
Nitrite: NEGATIVE
Total Protein, Urine: NEGATIVE
Urobilinogen, UA: 0.2 (ref 0.0–1.0)
pH: 7 (ref 5.0–8.0)

## 2013-02-28 LAB — MICROALBUMIN / CREATININE URINE RATIO
Microalb Creat Ratio: 0.8 mg/g (ref 0.0–30.0)
Microalb, Ur: 0.4 mg/dL (ref 0.0–1.9)

## 2013-03-13 ENCOUNTER — Other Ambulatory Visit: Payer: Self-pay | Admitting: Endocrinology

## 2013-03-14 ENCOUNTER — Other Ambulatory Visit: Payer: Self-pay | Admitting: *Deleted

## 2013-03-14 MED ORDER — OMEPRAZOLE 20 MG PO CPDR: 40.0000 mg | DELAYED_RELEASE_CAPSULE | Freq: Every day | ORAL | Status: DC

## 2013-03-30 ENCOUNTER — Other Ambulatory Visit: Payer: Self-pay | Admitting: Endocrinology

## 2013-03-30 MED ORDER — TESTOSTERONE 50 MG/5GM (1%) TD GEL: TRANSDERMAL | Status: DC

## 2013-05-02 ENCOUNTER — Ambulatory Visit (INDEPENDENT_AMBULATORY_CARE_PROVIDER_SITE_OTHER): Payer: Medicare HMO

## 2013-05-02 VITALS — BP 149/70 | HR 70 | Resp 68 | Ht 72.0 in | Wt 222.0 lb

## 2013-05-02 DIAGNOSIS — B351 Tinea unguium: Secondary | ICD-10-CM

## 2013-05-02 DIAGNOSIS — M79609 Pain in unspecified limb: Secondary | ICD-10-CM

## 2013-05-02 DIAGNOSIS — E1149 Type 2 diabetes mellitus with other diabetic neurological complication: Secondary | ICD-10-CM

## 2013-05-02 DIAGNOSIS — M2040 Other hammer toe(s) (acquired), unspecified foot: Secondary | ICD-10-CM

## 2013-05-02 DIAGNOSIS — E1140 Type 2 diabetes mellitus with diabetic neuropathy, unspecified: Secondary | ICD-10-CM

## 2013-05-02 DIAGNOSIS — E1142 Type 2 diabetes mellitus with diabetic polyneuropathy: Secondary | ICD-10-CM

## 2013-05-02 NOTE — Progress Notes (Signed)
   Subjective:    Patient ID: Gerald Martinez, male    DOB: 23-Nov-1933, 79 y.o.   MRN: [id number]  HPI Comments: '' TOENAILS TRIM''     Review of Systems  Eyes: Positive for visual disturbance.  All other systems reviewed and are negative.       Objective:   Physical Exam Neurovascular status is intact as as follows dorsalis pedis pulse two over four bilateral PT thready nonpalpable bilateral Refill time 3 seconds all digits skin temperature warm turgor diminished no edema rubor pallor or varicosities noted. Neurologically epicritic sensation to the next on Semmes Weinstein testing to the forefoot digits and plantar arch and heel. Patient does have paresthesias with burning and abnormal sensation the feet do to diabetes as well as lumbar radiculopathy. Neurologically skin color pigment normal hair growth absent nails thick friable hypertrophic brittle and discolored 1 through 5 bilateral following debridement the second digit left foot fourth digit right foot were treated with lumicain and Neosporin. Patient also did request new prescription for biotech for new diabetic shoes and diabetic insoles. Patient does have neuropathy with absent sensation cocking factors and cavus foot type and digital contractures with risks for ulceration secondary diabetes and peripheral neuropathy.       Assessment & Plan:  Diabetes peripheral neuropathy and angiopathy. Patient's painful mycotic dystrophic and incurvated nails are debrided 1 through 5 bilateral. Dispensed prescription for biotech for new shoes and 3 pairs of Plastizote insoles. Patient followup for palliative care in 3 months as needed  Alvan Dame DPM

## 2013-05-02 NOTE — Patient Instructions (Signed)
Diabetes and Foot Care Diabetes may cause you to have problems because of poor blood supply (circulation) to your feet and legs. This may cause the skin on your feet to become thinner, break easier, and heal more slowly. Your skin may become dry, and the skin may peel and crack. You may also have nerve damage in your legs and feet causing decreased feeling in them. You may not notice minor injuries to your feet that could lead to infections or more serious problems. Taking care of your feet is one of the most important things you can do for yourself.  HOME CARE INSTRUCTIONS  Wear shoes at all times, even in the house. Do not go barefoot. Bare feet are easily injured.  Check your feet daily for blisters, cuts, and redness. If you cannot see the bottom of your feet, use a mirror or ask someone for help.  Wash your feet with warm water (do not use hot water) and mild soap. Then pat your feet and the areas between your toes until they are completely dry. Do not soak your feet as this can dry your skin.  Apply a moisturizing lotion or petroleum jelly (that does not contain alcohol and is unscented) to the skin on your feet and to dry, brittle toenails. Do not apply lotion between your toes.  Trim your toenails straight across. Do not dig under them or around the cuticle. File the edges of your nails with an emery board or nail file.  Do not cut corns or calluses or try to remove them with medicine.  Wear clean socks or stockings every day. Make sure they are not too tight. Do not wear knee-high stockings since they may decrease blood flow to your legs.  Wear shoes that fit properly and have enough cushioning. To break in new shoes, wear them for just a few hours a day. This prevents you from injuring your feet. Always look in your shoes before you put them on to be sure there are no objects inside.  Do not cross your legs. This may decrease the blood flow to your feet.  If you find a minor scrape,  cut, or break in the skin on your feet, keep it and the skin around it clean and dry. These areas may be cleansed with mild soap and water. Do not cleanse the area with peroxide, alcohol, or iodine.  When you remove an adhesive bandage, be sure not to damage the skin around it.  If you have a wound, look at it several times a day to make sure it is healing.  Do not use heating pads or hot water bottles. They may burn your skin. If you have lost feeling in your feet or legs, you may not know it is happening until it is too late.  Make sure your health care provider performs a complete foot exam at least annually or more often if you have foot problems. Report any cuts, sores, or bruises to your health care provider immediately. SEEK MEDICAL CARE IF:   You have an injury that is not healing.  You have cuts or breaks in the skin.  You have an ingrown nail.  You notice redness on your legs or feet.  You feel burning or tingling in your legs or feet.  You have pain or cramps in your legs and feet.  Your legs or feet are numb.  Your feet always feel cold. SEEK IMMEDIATE MEDICAL CARE IF:   There is increasing redness,   swelling, or pain in or around a wound.  There is a red line that goes up your leg.  Pus is coming from a wound.  You develop a fever or as directed by your health care provider.  You notice a bad smell coming from an ulcer or wound. Document Released: 05/08/2000 Document Revised: 01/11/2013 Document Reviewed: 10/18/2012 [employer] Patient Information 2014 [employer].  

## 2013-05-05 ENCOUNTER — Encounter: Payer: Self-pay | Admitting: Endocrinology

## 2013-05-05 ENCOUNTER — Ambulatory Visit (INDEPENDENT_AMBULATORY_CARE_PROVIDER_SITE_OTHER): Payer: Medicare HMO | Admitting: Endocrinology

## 2013-05-05 VITALS — BP 115/50 | HR 60 | Temp 97.5°F | Ht 72.0 in | Wt 223.0 lb

## 2013-05-05 DIAGNOSIS — E119 Type 2 diabetes mellitus without complications: Secondary | ICD-10-CM

## 2013-05-05 DIAGNOSIS — E871 Hypo-osmolality and hyponatremia: Secondary | ICD-10-CM

## 2013-05-05 LAB — BASIC METABOLIC PANEL
Calcium: 8.8 mg/dL (ref 8.4–10.5)
GFR: 72.78 mL/min (ref >60.00)
Glucose, Bld: 76 mg/dL (ref 70–99)
Potassium: 4.5 mEq/L (ref 3.5–5.1)
Sodium: 136 mEq/L (ref 135–145)

## 2013-05-05 LAB — HEMOGLOBIN A1C: Hgb A1c MFr Bld: 6.5 % (ref 4.6–6.5)

## 2013-05-05 NOTE — Patient Instructions (Addendum)
Sorry, you do not qualify for medicare payment for diabetic shoes.   blood tests are being requested for you today.  We'll contact you with results.  Please come back for a follow-up appointment in 4 months.   check your blood sugar once a day.  vary the time of day when you check, between before the 3 meals, and at bedtime.  also check if you have symptoms of your blood sugar being too high or too low.  please keep a record of the readings and bring it to your next appointment here.  You can write it on any piece of paper.  please call us sooner if your blood sugar goes below 70, or if you have a lot of readings over 200.

## 2013-05-06 NOTE — Progress Notes (Signed)
Subjective:    Patient ID: Gerald Martinez, male    DOB: [birth year], 79 y.o.   MRN: [id number]  HPI Pt returns for f/u of type 2 DM (dx'ed 2008; he has mild if any neuropathy of the lower extremities, but he has associated CAD; he has never taken insulin).  no cbg record, but states cbg's are well-controlled. Past Medical History  Diagnosis Date  . Diabetes mellitus     Type II  . Hypertension   . BPH (benign prostatic hypertrophy)   . Dyslipidemia   . AAA (abdominal aortic aneurysm)   . Hearing loss   . Esophageal cancer   . CAD (coronary artery disease)     Post CABG in 2000.  LIMA to the LAD, SVG to OM, SVG to PDA, last perfusion study in 2007 with no high risk findings    Past Surgical History  Procedure Laterality Date  . Coronary artery bypass graft  2000  . Esophagectomy  2000  . Ventral hernia repair  2001    History   Social History  . Marital Status: Married    Spouse Name: N/A    Number of Children: N/A  . Years of Education: N/A   Occupational History  . Not on file.   Social History Main Topics  . Smoking status: Former Smoker    Quit date: 05/25/1997  . Smokeless tobacco: Never Used  . Alcohol Use: 3.6 oz/week    6 Glasses of wine per week  . Drug Use: No  . Sexual Activity: Not on file   Other Topics Concern  . Not on file   Social History Narrative  . No narrative on file    Current Outpatient Prescriptions on File Prior to Visit  Medication Sig Dispense Refill  . aspirin 325 MG EC tablet Take 325 mg by mouth daily.        Marland Kitchen doxazosin (CARDURA) 4 MG tablet Take 1 tablet (4 mg total) by mouth daily.  90 tablet  1  . furosemide (LASIX) 20 MG tablet Take 1 tablet (20 mg total) by mouth daily.  90 tablet  1  . gabapentin (NEURONTIN) 300 MG capsule       . glucose blood (FREESTYLE LITE) test strip Use as instructed once daily  100 each  11  . lidocaine (LIDODERM) 5 % Place 1 patch onto the skin daily. Remove & Discard patch within 12 hours or as  directed by MD      . LIDODERM 5 % PLACE 1 PATCH ONTO THE SKIN DAILY. REMOVE & DISCARD PATCH WITHIN 12 HOURS OR AS DIRECTED BY MD  30 patch  11  . losartan (COZAAR) 50 MG tablet       . Melatonin 10 MG TABS Take 1 tablet by mouth as needed.      . metFORMIN (GLUCOPHAGE-XR) 500 MG 24 hr tablet TAKE 1 TABLET BY MOUTH DAILY WITH BREAKFAST.  90 tablet  3  . Multiple Vitamin (MULTIVITAMIN) tablet Take 1 tablet by mouth daily.        Marland Kitchen omeprazole (PRILOSEC) 20 MG capsule Take 2 capsules (40 mg total) by mouth daily.  60 capsule  11  . simvastatin (ZOCOR) 40 MG tablet Take 1 tablet (40 mg total) by mouth at bedtime.  90 tablet  1  . tamsulosin (FLOMAX) 0.4 MG CAPS capsule       . testosterone (TESTIM) 50 MG/5GM GEL 7.5 grams daily  45 Tube  5   No current facility-administered  medications on file prior to visit.    Allergies  Allergen Reactions  . Propoxyphene-Acetaminophen     Family History  Problem Relation Age of Onset  . Heart disease Mother   . Stroke Mother   . Cancer Father     Lymphoma  . Cancer Sister     Uncertain  . Colon cancer Neg Hx     BP 115/50  Pulse 60  Temp(Src) 97.5 F (36.4 C) (Oral)  Ht 6' (1.829 m)  Wt 223 lb (101.152 kg)  BMI 30.24 kg/m2  SpO2 97%  Review of Systems denies hypoglycemia and sob    Objective:   Physical Exam VITAL SIGNS:  See vs page GENERAL: no distress  Lab Results  Component Value Date   HGBA1C 6.5 05/05/2013   Lab Results  Component Value Date   CREATININE 1.1 05/05/2013   BUN 13 05/05/2013   NA 136 05/05/2013   K 4.5 05/05/2013   CL 101 05/05/2013   CO2 28 05/05/2013       Assessment & Plan:  DM: well-controlled Hyponatremia: resolved. HTN: well-controlled

## 2013-05-12 ENCOUNTER — Ambulatory Visit (INDEPENDENT_AMBULATORY_CARE_PROVIDER_SITE_OTHER): Payer: Medicare HMO | Admitting: *Deleted

## 2013-05-12 ENCOUNTER — Other Ambulatory Visit: Payer: Self-pay

## 2013-05-12 DIAGNOSIS — E1149 Type 2 diabetes mellitus with other diabetic neurological complication: Secondary | ICD-10-CM

## 2013-05-12 DIAGNOSIS — E1140 Type 2 diabetes mellitus with diabetic neuropathy, unspecified: Secondary | ICD-10-CM

## 2013-05-12 DIAGNOSIS — E1142 Type 2 diabetes mellitus with diabetic polyneuropathy: Secondary | ICD-10-CM

## 2013-05-12 NOTE — Progress Notes (Signed)
Dr. Ralene Cork approved the ordering of the orthotics for pt. Pt was notified. Melanie collected payment from pt. I placed the order today. Will contact pt once the orthotics arrive.

## 2013-05-12 NOTE — Patient Instructions (Signed)
Our office will notify you once your orthotics arrive. At that time an appointment will be needed to pick them up.  

## 2013-05-12 NOTE — Progress Notes (Signed)
Pt presents to the office stating that Dr. Ralene Cork had wrote him a Rx to [employer] for pair of diabetic shoes and 3 pair of diabetic insoles. Pt states that his insurance wasn't going to cover it and he would like to get them from [place]. The patient and I discussed the difference between our diabetic insoles vs. [employer] insoles. Pt came to the conclusion that our insoles, 2 pair, wouldn't hold up for a yr and a half as he had hoped. I recommended our custom molded orthotics but told the pt I would have to ask Dr Ralene Cork about this first. Once I speak to Dr. Ralene Cork I will call the pt and let him know how we will proceed.

## 2013-06-02 ENCOUNTER — Telehealth: Payer: Self-pay | Admitting: *Deleted

## 2013-06-02 NOTE — Telephone Encounter (Signed)
Patient called inquiring about his orthotics whether or not they are here.  I informed him that the orthotics came in today.  I informed him he would need to schedule an appointment to pick them up with Dr. Blenda Mounts.  I transferred him to a scheduler, CG, for an appointment.

## 2013-06-14 ENCOUNTER — Ambulatory Visit (INDEPENDENT_AMBULATORY_CARE_PROVIDER_SITE_OTHER): Payer: Medicare HMO

## 2013-06-14 DIAGNOSIS — M722 Plantar fascial fibromatosis: Secondary | ICD-10-CM

## 2013-06-14 DIAGNOSIS — M79609 Pain in unspecified limb: Secondary | ICD-10-CM

## 2013-06-14 DIAGNOSIS — E1140 Type 2 diabetes mellitus with diabetic neuropathy, unspecified: Secondary | ICD-10-CM

## 2013-06-14 DIAGNOSIS — E1149 Type 2 diabetes mellitus with other diabetic neurological complication: Secondary | ICD-10-CM

## 2013-06-14 DIAGNOSIS — E1142 Type 2 diabetes mellitus with diabetic polyneuropathy: Secondary | ICD-10-CM

## 2013-06-14 NOTE — Progress Notes (Signed)
   Subjective:    Patient ID: Gerald Martinez, male    DOB: Apr 18, 1934, 80 y.o.   MRN: [id number]  HPI Comments: PICK UP ORTHOTICS AND GIVEN INSTRUCTION     Review of Systems     Objective:   Physical Exam Patient was seen by nursing staff for dispensing of orthoses at this time. Orthotics and custom orthotic dispensed with break in wearing instructions and given oral and written instructions given to the patient this time. Patient is been orthotics were for where with the use of orthotics as a replacement new orthotic dispensed at this time. Patient tried on the office feels comfortable with the orthoses at this time we'll followup in the future as needed for adjustments if needed       Assessment & Plan:  New functional orthoses dispensed at this time with break in wearing instructions been given by staff followup in the future for adjustments if needed  Harriet Masson DPM

## 2013-06-27 ENCOUNTER — Telehealth: Payer: Self-pay

## 2013-06-27 NOTE — Telephone Encounter (Signed)
Pt called stating that his Testim gel is not going to be covered anymore. Pt states that he was instructed by [employer] to contact us about possible alternatives. Pt states that he is willing to try what the Dr believes to be best, but he is concerned with his energy levels.  Please advise, Thanks!

## 2013-06-27 NOTE — Telephone Encounter (Signed)
Please ask pt to find out what are alternatives, and let us know.

## 2013-06-29 NOTE — Telephone Encounter (Signed)
Pt informed

## 2013-07-03 DIAGNOSIS — Z0279 Encounter for issue of other medical certificate: Secondary | ICD-10-CM

## 2013-07-06 DIAGNOSIS — Z0279 Encounter for issue of other medical certificate: Secondary | ICD-10-CM

## 2013-07-25 ENCOUNTER — Ambulatory Visit: Payer: Medicare HMO | Admitting: Endocrinology

## 2013-08-07 ENCOUNTER — Other Ambulatory Visit: Payer: Self-pay | Admitting: Endocrinology

## 2013-08-08 ENCOUNTER — Ambulatory Visit: Payer: Medicare HMO

## 2013-08-15 ENCOUNTER — Encounter: Payer: Self-pay | Admitting: Endocrinology

## 2013-08-15 ENCOUNTER — Ambulatory Visit (INDEPENDENT_AMBULATORY_CARE_PROVIDER_SITE_OTHER): Payer: Medicare HMO | Admitting: Endocrinology

## 2013-08-15 ENCOUNTER — Ambulatory Visit (INDEPENDENT_AMBULATORY_CARE_PROVIDER_SITE_OTHER): Payer: Medicare HMO

## 2013-08-15 VITALS — BP 127/57 | HR 71 | Resp 16

## 2013-08-15 VITALS — BP 132/64 | HR 71 | Temp 97.8°F | Ht 72.0 in | Wt 218.0 lb

## 2013-08-15 DIAGNOSIS — M79609 Pain in unspecified limb: Secondary | ICD-10-CM

## 2013-08-15 DIAGNOSIS — B351 Tinea unguium: Secondary | ICD-10-CM

## 2013-08-15 DIAGNOSIS — E119 Type 2 diabetes mellitus without complications: Secondary | ICD-10-CM

## 2013-08-15 DIAGNOSIS — E1140 Type 2 diabetes mellitus with diabetic neuropathy, unspecified: Secondary | ICD-10-CM

## 2013-08-15 DIAGNOSIS — E299 Testicular dysfunction, unspecified: Secondary | ICD-10-CM

## 2013-08-15 LAB — TESTOSTERONE: Testosterone: 305.75 ng/dL — ABNORMAL LOW (ref 350.00–890.00)

## 2013-08-15 LAB — HEMOGLOBIN A1C: HEMOGLOBIN A1C: 6.6 % — AB (ref 4.6–6.5)

## 2013-08-15 NOTE — Progress Notes (Signed)
Subjective:    Patient ID: Gerald Martinez, male    DOB: June 28, 1933, 80 y.o.   MRN: [id number]  HPI Pt returns for f/u of type 2 DM (dx'ed 2008; he has mild if any neuropathy of the lower extremities, but he has associated CAD; he has never taken insulin).  no cbg record, but states cbg's are well-controlled.  pt states he feels well in general, except for fatigue.   He feels better on the increased androgel.   Past Medical History  Diagnosis Date  . Diabetes mellitus     Type II  . Hypertension   . BPH (benign prostatic hypertrophy)   . Dyslipidemia   . AAA (abdominal aortic aneurysm)   . Hearing loss   . Esophageal cancer   . CAD (coronary artery disease)     Post CABG in 2000.  LIMA to the LAD, SVG to OM, SVG to PDA, last perfusion study in 2007 with no high risk findings    Past Surgical History  Procedure Laterality Date  . Coronary artery bypass graft  2000  . Esophagectomy  2000  . Ventral hernia repair  2001    History   Social History  . Marital Status: Married    Spouse Name: N/A    Number of Children: N/A  . Years of Education: N/A   Occupational History  . Not on file.   Social History Main Topics  . Smoking status: Former Smoker    Quit date: 05/25/1997  . Smokeless tobacco: Never Used  . Alcohol Use: 3.6 oz/week    6 Glasses of wine per week  . Drug Use: No  . Sexual Activity: Not on file   Other Topics Concern  . Not on file   Social History Narrative  . No narrative on file    Current Outpatient Prescriptions on File Prior to Visit  Medication Sig Dispense Refill  . aspirin 325 MG EC tablet Take 325 mg by mouth daily.        Marland Kitchen doxazosin (CARDURA) 4 MG tablet Take 1 tablet (4 mg total) by mouth daily.  90 tablet  1  . furosemide (LASIX) 20 MG tablet TAKE 1 TABLET BY MOUTH DAILY.  90 tablet  1  . gabapentin (NEURONTIN) 300 MG capsule       . glucose blood (FREESTYLE LITE) test strip Use as instructed once daily  100 each  11  . lidocaine  (LIDODERM) 5 % Place 1 patch onto the skin daily. Remove & Discard patch within 12 hours or as directed by MD      . LIDODERM 5 % PLACE 1 PATCH ONTO THE SKIN DAILY. REMOVE & DISCARD PATCH WITHIN 12 HOURS OR AS DIRECTED BY MD  30 patch  11  . losartan (COZAAR) 50 MG tablet       . Melatonin 10 MG TABS Take 1 tablet by mouth as needed.      . metFORMIN (GLUCOPHAGE-XR) 500 MG 24 hr tablet TAKE 1 TABLET BY MOUTH DAILY WITH BREAKFAST.  90 tablet  3  . Multiple Vitamin (MULTIVITAMIN) tablet Take 1 tablet by mouth daily.        Marland Kitchen omeprazole (PRILOSEC) 20 MG capsule Take 2 capsules (40 mg total) by mouth daily.  60 capsule  11  . simvastatin (ZOCOR) 40 MG tablet Take 1 tablet (40 mg total) by mouth at bedtime.  90 tablet  1  . tamsulosin (FLOMAX) 0.4 MG CAPS capsule  No current facility-administered medications on file prior to visit.    Allergies  Allergen Reactions  . Propoxyphene N-Acetaminophen     Family History  Problem Relation Age of Onset  . Heart disease Mother   . Stroke Mother   . Cancer Father     Lymphoma  . Cancer Sister     Uncertain  . Colon cancer Neg Hx     BP 132/64  Pulse 71  Temp(Src) 97.8 F (36.6 C) (Oral)  Ht 6' (1.829 m)  Wt 218 lb (98.884 kg)  BMI 29.56 kg/m2  SpO2 98%  Review of Systems Denies decreased urinary stream and weight change.      Objective:   Physical Exam VITAL SIGNS:  See vs page GENERAL: no distress  Lab Results  Component Value Date   HGBA1C 6.6* 08/15/2013   Lab Results  Component Value Date   TESTOSTERONE 305.75* 08/15/2013      Assessment & Plan:  Hypogonadism: he needs increased rx DM: well-controlled.

## 2013-08-15 NOTE — Progress Notes (Signed)
   Subjective:    Patient ID: CICERO NOY, male    DOB: December 16, 1933, 80 y.o.   MRN: [id number]  HPI patient presents for diabetic foot and nail care    Review of Systems no new changes or findings     Objective:   Physical Exam Vascular status is intact pedal pulses DP plus one over 4 PT plus one over 4 bilateral patient does have a Refill timed 3-4 seconds all digits skin temperature warm turgor diminished history of coronary bypass with graft sites bilateral lower leg scar is still visible to. Patient does have thick brittle crumbly friable gratified nails 1 through 5 bilateral following debridement the first left and third right they're treated with lumicain and Neosporin and Band-Aid dressings. Patient been using topical antifungal as instructed has some improvement although not complete resolution of the kyphosis of deformity of nails. Continues to have decreased epicritic sensation confirmed on Semmes Weinstein testing to toes and plantar arch. There is normal plantar response DTRs not elicited.       Assessment & Plan:  Assessment this time his diabetes with history peripheral neuropathy and some angiopathy painful mycotic brittle deformed crumbly nails debrided 1 through 5 bilateral the presence of pain in symptomology as well as diabetes and complications continue with topical antifungal lumicain Neosporin applied to the 2 nails first left and third right as indicated recommend to 3 month followup for palliative care  Harriet Masson DPM

## 2013-08-15 NOTE — Patient Instructions (Signed)
Diabetes and Foot Care Diabetes may cause you to have problems because of poor blood supply (circulation) to your feet and legs. This may cause the skin on your feet to become thinner, break easier, and heal more slowly. Your skin may become dry, and the skin may peel and crack. You may also have nerve damage in your legs and feet causing decreased feeling in them. You may not notice minor injuries to your feet that could lead to infections or more serious problems. Taking care of your feet is one of the most important things you can do for yourself.  HOME CARE INSTRUCTIONS  Wear shoes at all times, even in the house. Do not go barefoot. Bare feet are easily injured.  Check your feet daily for blisters, cuts, and redness. If you cannot see the bottom of your feet, use a mirror or ask someone for help.  Wash your feet with warm water (do not use hot water) and mild soap. Then pat your feet and the areas between your toes until they are completely dry. Do not soak your feet as this can dry your skin.  Apply a moisturizing lotion or petroleum jelly (that does not contain alcohol and is unscented) to the skin on your feet and to dry, brittle toenails. Do not apply lotion between your toes.  Trim your toenails straight across. Do not dig under them or around the cuticle. File the edges of your nails with an emery board or nail file.  Do not cut corns or calluses or try to remove them with medicine.  Wear clean socks or stockings every day. Make sure they are not too tight. Do not wear knee-high stockings since they may decrease blood flow to your legs.  Wear shoes that fit properly and have enough cushioning. To break in new shoes, wear them for just a few hours a day. This prevents you from injuring your feet. Always look in your shoes before you put them on to be sure there are no objects inside.  Do not cross your legs. This may decrease the blood flow to your feet.  If you find a minor scrape,  cut, or break in the skin on your feet, keep it and the skin around it clean and dry. These areas may be cleansed with mild soap and water. Do not cleanse the area with peroxide, alcohol, or iodine.  When you remove an adhesive bandage, be sure not to damage the skin around it.  If you have a wound, look at it several times a day to make sure it is healing.  Do not use heating pads or hot water bottles. They may burn your skin. If you have lost feeling in your feet or legs, you may not know it is happening until it is too late.  Make sure your health care provider performs a complete foot exam at least annually or more often if you have foot problems. Report any cuts, sores, or bruises to your health care provider immediately. SEEK MEDICAL CARE IF:   You have an injury that is not healing.  You have cuts or breaks in the skin.  You have an ingrown nail.  You notice redness on your legs or feet.  You feel burning or tingling in your legs or feet.  You have pain or cramps in your legs and feet.  Your legs or feet are numb.  Your feet always feel cold. SEEK IMMEDIATE MEDICAL CARE IF:   There is increasing redness,   swelling, or pain in or around a wound.  There is a red line that goes up your leg.  Pus is coming from a wound.  You develop a fever or as directed by your health care provider.  You notice a bad smell coming from an ulcer or wound. Document Released: 05/08/2000 Document Revised: 01/11/2013 Document Reviewed: 10/18/2012 [employer] Patient Information 2014 [employer].  

## 2013-08-15 NOTE — Patient Instructions (Addendum)
blood tests are being requested for you today.  We'll contact you with results. Please come back for a follow-up appointment in 4 months. check your blood sugar once a day.  vary the time of day when you check, between before the 3 meals, and at bedtime.  also check if you have symptoms of your blood sugar being too high or too low.  please keep a record of the readings and bring it to your next appointment here.  You can write it on any piece of paper.  please call us sooner if your blood sugar goes below 70, or if you have a lot of readings over 200.  

## 2013-08-16 ENCOUNTER — Encounter: Payer: Self-pay | Admitting: Endocrinology

## 2013-08-16 MED ORDER — TESTOSTERONE 50 MG/5GM (1%) TD GEL: 10.0000 g | Freq: Every day | TRANSDERMAL | Status: DC

## 2013-08-28 ENCOUNTER — Other Ambulatory Visit: Payer: Self-pay | Admitting: Endocrinology

## 2013-09-04 ENCOUNTER — Ambulatory Visit: Payer: Medicare HMO | Admitting: Endocrinology

## 2013-09-04 ENCOUNTER — Other Ambulatory Visit: Payer: Self-pay | Admitting: Endocrinology

## 2013-09-08 ENCOUNTER — Other Ambulatory Visit: Payer: Self-pay | Admitting: Endocrinology

## 2013-11-14 ENCOUNTER — Ambulatory Visit: Payer: Medicare HMO

## 2013-11-21 ENCOUNTER — Ambulatory Visit (INDEPENDENT_AMBULATORY_CARE_PROVIDER_SITE_OTHER): Payer: Medicare HMO | Admitting: Endocrinology

## 2013-11-21 ENCOUNTER — Encounter: Payer: Self-pay | Admitting: Endocrinology

## 2013-11-21 VITALS — BP 132/72 | HR 69 | Temp 97.7°F | Ht 72.0 in | Wt 220.0 lb

## 2013-11-21 DIAGNOSIS — E119 Type 2 diabetes mellitus without complications: Secondary | ICD-10-CM

## 2013-11-21 DIAGNOSIS — E299 Testicular dysfunction, unspecified: Secondary | ICD-10-CM

## 2013-11-21 LAB — HEMOGLOBIN A1C: HEMOGLOBIN A1C: 6.8 % — AB (ref 4.6–6.5)

## 2013-11-21 NOTE — Patient Instructions (Signed)
blood tests are being requested for you today.  We'll contact you with results.  Please come back for a regular physical appointment in 3 months.   check your blood sugar once a day.  vary the time of day when you check, between before the 3 meals, and at bedtime.  also check if you have symptoms of your blood sugar being too high or too low.  please keep a record of the readings and bring it to your next appointment here.  You can write it on any piece of paper.  please call us sooner if your blood sugar goes below 70, or if you have a lot of readings over 200.

## 2013-11-21 NOTE — Progress Notes (Signed)
Subjective:    Patient ID: Gerald Martinez, male    DOB: 1934/01/01, 80 y.o.   MRN: [id number]  HPI Pt returns for f/u of type 2 DM (dx'ed 2008; he has mild if any neuropathy of the lower extremities, but he has associated CAD; he has never taken insulin).  no cbg record, but states cbg's are well-controlled.  pt states he feels well in general, except for fatigue.   Since the testosterone was increased, pt states he feels no different, and well in general.   Past Medical History  Diagnosis Date  . Diabetes mellitus     Type II  . Hypertension   . BPH (benign prostatic hypertrophy)   . Dyslipidemia   . AAA (abdominal aortic aneurysm)   . Hearing loss   . Esophageal cancer   . CAD (coronary artery disease)     Post CABG in 2000.  LIMA to the LAD, SVG to OM, SVG to PDA, last perfusion study in 2007 with no high risk findings    Past Surgical History  Procedure Laterality Date  . Coronary artery bypass graft  2000  . Esophagectomy  2000  . Ventral hernia repair  2001    History   Social History  . Marital Status: Married    Spouse Name: N/A    Number of Children: N/A  . Years of Education: N/A   Occupational History  . Not on file.   Social History Main Topics  . Smoking status: Former Smoker    Quit date: 05/25/1997  . Smokeless tobacco: Never Used  . Alcohol Use: 3.6 oz/week    6 Glasses of wine per week  . Drug Use: No  . Sexual Activity: Not on file   Other Topics Concern  . Not on file   Social History Narrative  . No narrative on file    Current Outpatient Prescriptions on File Prior to Visit  Medication Sig Dispense Refill  . aspirin 325 MG EC tablet Take 325 mg by mouth daily.        Marland Kitchen doxazosin (CARDURA) 4 MG tablet TAKE 1 TABLET BY MOUTH DAILY.  90 tablet  1  . furosemide (LASIX) 20 MG tablet TAKE 1 TABLET BY MOUTH DAILY.  90 tablet  1  . gabapentin (NEURONTIN) 300 MG capsule       . glucose blood (FREESTYLE LITE) test strip Use as instructed once  daily  100 each  11  . lidocaine (LIDODERM) 5 % Place 1 patch onto the skin daily. Remove & Discard patch within 12 hours or as directed by MD      . losartan (COZAAR) 50 MG tablet TAKE 1 TABLET BY MOUTH ONCE DAILY. (REPLACES DIOVAN)  90 tablet  PRN  . Melatonin 10 MG TABS Take 1 tablet by mouth as needed.      . metFORMIN (GLUCOPHAGE-XR) 500 MG 24 hr tablet TAKE 1 TABLET BY MOUTH DAILY WITH BREAKFAST.  90 tablet  3  . Multiple Vitamin (MULTIVITAMIN) tablet Take 1 tablet by mouth daily.        Marland Kitchen omeprazole (PRILOSEC) 20 MG capsule Take 2 capsules (40 mg total) by mouth daily.  60 capsule  11  . simvastatin (ZOCOR) 40 MG tablet TAKE 1 TABLET BY MOUTH AT BEDTIME.  90 tablet  1  . tamsulosin (FLOMAX) 0.4 MG CAPS capsule       . testosterone (ANDROGEL) 50 MG/5GM (1%) GEL Place 10 g onto the skin daily.  300 g  5   No current facility-administered medications on file prior to visit.    Allergies  Allergen Reactions  . Propoxyphene N-Acetaminophen     Family History  Problem Relation Age of Onset  . Heart disease Mother   . Stroke Mother   . Cancer Father     Lymphoma  . Cancer Sister     Uncertain  . Colon cancer Neg Hx     BP 132/72  Pulse 69  Temp(Src) 97.7 F (36.5 C) (Oral)  Ht 6' (1.829 m)  Wt 220 lb (99.791 kg)  BMI 29.83 kg/m2  SpO2 97%    Review of Systems  Constitutional: Negative for unexpected weight change.  Genitourinary: Negative for difficulty urinating.       Objective:   Physical Exam Pulses: dorsalis pedis intact bilat.  Feet: no deformity. feet are of normal color and temp. There is bilateral onychomycosis. Old healed surgical scars (bilat vein harvest). 1+ bilat leg edema.  Skin: no ulcer on the feet.  Neuro: sensation is intact to touch on the feet, but decreased from normal.   Lab Results  Component Value Date   HGBA1C 6.8* 11/21/2013   Lab Results  Component Value Date   TESTOSTERONE 292.68* 11/21/2013       Assessment & Plan:  DM:  well-controlled Hypogonadism: testosterone is borderline low for pt's age, so i advised pt to please continue the same testosterone.   BPH: stable despite being on testosterone replacement.    Patient is advised the following: Patient Instructions  blood tests are being requested for you today.  We'll contact you with results.  Please come back for a regular physical appointment in 3 months.   check your blood sugar once a day.  vary the time of day when you check, between before the 3 meals, and at bedtime.  also check if you have symptoms of your blood sugar being too high or too low.  please keep a record of the readings and bring it to your next appointment here.  You can write it on any piece of paper.  please call us sooner if your blood sugar goes below 70, or if you have a lot of readings over 200.

## 2013-11-22 LAB — TESTOSTERONE: Testosterone: 292.68 ng/dL — ABNORMAL LOW (ref 300.00–890.00)

## 2013-11-28 ENCOUNTER — Ambulatory Visit (INDEPENDENT_AMBULATORY_CARE_PROVIDER_SITE_OTHER): Payer: Medicare HMO

## 2013-11-28 VITALS — BP 112/55 | HR 68 | Resp 16

## 2013-11-28 DIAGNOSIS — E1142 Type 2 diabetes mellitus with diabetic polyneuropathy: Secondary | ICD-10-CM

## 2013-11-28 DIAGNOSIS — M79606 Pain in leg, unspecified: Secondary | ICD-10-CM

## 2013-11-28 DIAGNOSIS — E1149 Type 2 diabetes mellitus with other diabetic neurological complication: Secondary | ICD-10-CM

## 2013-11-28 DIAGNOSIS — M2040 Other hammer toe(s) (acquired), unspecified foot: Secondary | ICD-10-CM

## 2013-11-28 DIAGNOSIS — E1140 Type 2 diabetes mellitus with diabetic neuropathy, unspecified: Secondary | ICD-10-CM

## 2013-11-28 DIAGNOSIS — B351 Tinea unguium: Secondary | ICD-10-CM

## 2013-11-28 DIAGNOSIS — M79609 Pain in unspecified limb: Secondary | ICD-10-CM

## 2013-11-28 NOTE — Progress Notes (Signed)
   Subjective:    Patient ID: Gerald Martinez, male    DOB: 08-31-1933, 80 y.o.   MRN: [id number]  HPI Comments: trim my nails and check my feet     Review of Systems no systemic changes or findings noted    Objective:   Physical Exam Neurovascular status is intact pedal pulses palpable DP and PT plus one over 4 bilateral capillary refill time 3 seconds all digits epicritic sensation diminished on Semmes Weinstein testing to forefoot digits nails thick brittle crumbly friable hallux second third and fourth digits bilateral hallux most symptomatic and painful and tender both on palpation and inflow shoewear no open wounds ulcerations no secondary infections. At this time DTRs not elicited there is normal plantar response noted mild flexible digital contractures are identified.       Assessment & Plan:  Assessment this time his diabetes with history peripheral neuropathy and angiopathy painful brittle crumbly friable mycotic nails 1 through 4 bilateral debridement the presence of diabetes and complications. At this time Neosporin applied to the left hallux and to the fourth toe right foot following debridement history with lumicain and Neosporin return in 3 months for continued palliative care in the future as needed  Gerald Martinez DPM

## 2013-11-28 NOTE — Patient Instructions (Signed)
Diabetes and Foot Care Diabetes may cause you to have problems because of poor blood supply (circulation) to your feet and legs. This may cause the skin on your feet to become thinner, break easier, and heal more slowly. Your skin may become dry, and the skin may peel and crack. You may also have nerve damage in your legs and feet causing decreased feeling in them. You may not notice minor injuries to your feet that could lead to infections or more serious problems. Taking care of your feet is one of the most important things you can do for yourself.  HOME CARE INSTRUCTIONS  Wear shoes at all times, even in the house. Do not go barefoot. Bare feet are easily injured.  Check your feet daily for blisters, cuts, and redness. If you cannot see the bottom of your feet, use a mirror or ask someone for help.  Wash your feet with warm water (do not use hot water) and mild soap. Then pat your feet and the areas between your toes until they are completely dry. Do not soak your feet as this can dry your skin.  Apply a moisturizing lotion or petroleum jelly (that does not contain alcohol and is unscented) to the skin on your feet and to dry, brittle toenails. Do not apply lotion between your toes.  Trim your toenails straight across. Do not dig under them or around the cuticle. File the edges of your nails with an emery board or nail file.  Do not cut corns or calluses or try to remove them with medicine.  Wear clean socks or stockings every day. Make sure they are not too tight. Do not wear knee-high stockings since they may decrease blood flow to your legs.  Wear shoes that fit properly and have enough cushioning. To break in new shoes, wear them for just a few hours a day. This prevents you from injuring your feet. Always look in your shoes before you put them on to be sure there are no objects inside.  Do not cross your legs. This may decrease the blood flow to your feet.  If you find a minor scrape,  cut, or break in the skin on your feet, keep it and the skin around it clean and dry. These areas may be cleansed with mild soap and water. Do not cleanse the area with peroxide, alcohol, or iodine.  When you remove an adhesive bandage, be sure not to damage the skin around it.  If you have a wound, look at it several times a day to make sure it is healing.  Do not use heating pads or hot water bottles. They may burn your skin. If you have lost feeling in your feet or legs, you may not know it is happening until it is too late.  Make sure your health care provider performs a complete foot exam at least annually or more often if you have foot problems. Report any cuts, sores, or bruises to your health care provider immediately. SEEK MEDICAL CARE IF:   You have an injury that is not healing.  You have cuts or breaks in the skin.  You have an ingrown nail.  You notice redness on your legs or feet.  You feel burning or tingling in your legs or feet.  You have pain or cramps in your legs and feet.  Your legs or feet are numb.  Your feet always feel cold. SEEK IMMEDIATE MEDICAL CARE IF:   There is increasing redness,   swelling, or pain in or around a wound.  There is a red line that goes up your leg.  Pus is coming from a wound.  You develop a fever or as directed by your health care provider.  You notice a bad smell coming from an ulcer or wound. Document Released: 05/08/2000 Document Revised: 01/11/2013 Document Reviewed: 10/18/2012 Exit[employer] Patient Information 2015 [employer]. This information is not intended to replace advice given to you by your health care provider. Make sure you discuss any questions you have with your health care provider.  

## 2013-12-19 ENCOUNTER — Encounter: Payer: Self-pay | Admitting: Endocrinology

## 2013-12-19 LAB — HM DIABETES EYE EXAM

## 2013-12-25 ENCOUNTER — Other Ambulatory Visit: Payer: Self-pay | Admitting: Endocrinology

## 2014-01-09 ENCOUNTER — Ambulatory Visit (INDEPENDENT_AMBULATORY_CARE_PROVIDER_SITE_OTHER): Payer: Medicare HMO | Admitting: Cardiology

## 2014-01-09 ENCOUNTER — Encounter: Payer: Self-pay | Admitting: Cardiology

## 2014-01-09 VITALS — BP 130/80 | HR 62 | Ht 71.0 in | Wt 218.0 lb

## 2014-01-09 DIAGNOSIS — I10 Essential (primary) hypertension: Secondary | ICD-10-CM

## 2014-01-09 DIAGNOSIS — I2510 Atherosclerotic heart disease of native coronary artery without angina pectoris: Secondary | ICD-10-CM

## 2014-01-09 NOTE — Patient Instructions (Signed)
Your physician recommends that you schedule a follow-up appointment in: one year with Dr. Hochrein  

## 2014-01-09 NOTE — Progress Notes (Signed)
HPI Patient presents for followup of his known coronary disease. Since I last saw him he has had no new cardiovascular complaints. He remains active. He walks to graveyard doing genealogy and he hopes to go back to [place] to do this this year.  He exercises at the [facility].. With this he denies any chest pressure, neck or arm discomfort. He's had no palpitations, presyncope or syncope. He has had no PND or orthopnea. He has had no new symptoms since he had a stress perfusion study in 2012.  Allergies  Allergen Reactions  . Propoxyphene N-Acetaminophen     Current Outpatient Prescriptions  Medication Sig Dispense Refill  . aspirin 325 MG EC tablet Take 325 mg by mouth daily.        Marland Kitchen doxazosin (CARDURA) 4 MG tablet TAKE 1 TABLET BY MOUTH DAILY.  90 tablet  1  . furosemide (LASIX) 20 MG tablet TAKE 1 TABLET BY MOUTH DAILY.  90 tablet  1  . gabapentin (NEURONTIN) 300 MG capsule       . glucose blood (FREESTYLE LITE) test strip Use as instructed once daily  100 each  11  . Lancets (FREESTYLE) lancets       . lidocaine (LIDODERM) 5 % Place 1 patch onto the skin daily. Remove & Discard patch within 12 hours or as directed by MD      . losartan (COZAAR) 50 MG tablet TAKE 1 TABLET BY MOUTH ONCE DAILY. (REPLACES DIOVAN)  90 tablet  PRN  . Melatonin 10 MG TABS Take 1 tablet by mouth as needed.      . metFORMIN (GLUCOPHAGE-XR) 500 MG 24 hr tablet TAKE 1 TABLET BY MOUTH DAILY WITH BREAKFAST.  90 tablet  3  . Multiple Vitamin (MULTIVITAMIN) tablet Take 1 tablet by mouth daily.        Marland Kitchen omeprazole (PRILOSEC) 20 MG capsule Take 2 capsules (40 mg total) by mouth daily.  60 capsule  11  . simvastatin (ZOCOR) 40 MG tablet TAKE 1 TABLET BY MOUTH AT BEDTIME.  90 tablet  1  . tamsulosin (FLOMAX) 0.4 MG CAPS capsule       . testosterone (ANDROGEL) 50 MG/5GM (1%) GEL Place 10 g onto the skin daily.  300 g  5   No current facility-administered medications for this visit.    Past Medical History  Diagnosis  Date  . Diabetes mellitus     Type II  . Hypertension   . BPH (benign prostatic hypertrophy)   . Dyslipidemia   . AAA (abdominal aortic aneurysm)   . Hearing loss   . Esophageal cancer   . CAD (coronary artery disease)     Post CABG in 2000.  LIMA to the LAD, SVG to OM, SVG to PDA, last perfusion study in 2007 with no high risk findings    Past Surgical History  Procedure Laterality Date  . Coronary artery bypass graft  2000  . Esophagectomy  2000  . Ventral hernia repair  2001    ROS: As stated in the HPI and negative for all other systems.  PHYSICAL EXAM BP 130/80  Pulse 62  Ht 5\' 11"  (1.803 m)  Wt 218 lb (98.884 kg)  BMI 30.42 kg/m2 GENERAL:  Well appearing  NECK:  No jugular venous distention, waveform within normal limits, carotid upstroke brisk and symmetric, no bruits, no thyromegaly LUNGS:  Clear to auscultation bilaterally BACK:  No CVA tenderness CHEST:  Well healed sternotomy scar. HEART:  PMI not displaced or sustained,S1  and S2 within normal limits, no S3, no S4, no clicks, no rubs, no murmurs ABD:  Flat, positive bowel sounds normal in frequency in pitch, no bruits, no rebound, no guarding, no midline pulsatile mass, no hepatomegaly, no splenomegaly, nonhealing abdominal wounds EXT:  2 plus pulses throughout, mild right greater than left leg edema, no cyanosis no clubbing, venous stasis changes   EKG:  Sinus rhythm, rate 62, axis within normal limits, intervals within normal limits, no acute ST-T wave changes.  01/09/2014   ASSESSMENT AND PLAN  CORONARY ATHEROSCLEROSIS NATIVE CORONARY ARTERY -  He has had no new symptoms since his stress test in 2012. No further cardiovascular testing is suggested. She will continue with risk reduction.  HYPERTENSION -  The blood pressure is at target. No change in medications is indicated. We will continue with therapeutic lifestyle changes (TLC).  HYPERCHOLESTEROLEMIA -  He will have this followed up again when he sees  Renato Shin, MD.  His lipids last year were excellent  Overweight -  The patient understands the need to lose weight with diet and exercise.     AAA - He had this looked at last year and the repair was stable.

## 2014-01-15 ENCOUNTER — Other Ambulatory Visit: Payer: Self-pay | Admitting: Endocrinology

## 2014-01-25 ENCOUNTER — Encounter: Payer: Self-pay | Admitting: Endocrinology

## 2014-01-25 ENCOUNTER — Ambulatory Visit (INDEPENDENT_AMBULATORY_CARE_PROVIDER_SITE_OTHER): Payer: Medicare HMO | Admitting: Endocrinology

## 2014-01-25 VITALS — BP 128/66 | HR 67 | Temp 97.7°F | Ht 71.0 in | Wt 216.0 lb

## 2014-01-25 DIAGNOSIS — J309 Allergic rhinitis, unspecified: Secondary | ICD-10-CM

## 2014-01-25 DIAGNOSIS — E119 Type 2 diabetes mellitus without complications: Secondary | ICD-10-CM

## 2014-01-25 DIAGNOSIS — Z79899 Other long term (current) drug therapy: Secondary | ICD-10-CM

## 2014-01-25 DIAGNOSIS — Z0000 Encounter for general adult medical examination without abnormal findings: Secondary | ICD-10-CM

## 2014-01-25 DIAGNOSIS — Z125 Encounter for screening for malignant neoplasm of prostate: Secondary | ICD-10-CM

## 2014-01-25 DIAGNOSIS — I10 Essential (primary) hypertension: Secondary | ICD-10-CM

## 2014-01-25 DIAGNOSIS — E7800 Pure hypercholesterolemia, unspecified: Secondary | ICD-10-CM

## 2014-01-25 DIAGNOSIS — E871 Hypo-osmolality and hyponatremia: Secondary | ICD-10-CM

## 2014-01-25 DIAGNOSIS — Z23 Encounter for immunization: Secondary | ICD-10-CM

## 2014-01-25 LAB — LIPID PANEL
CHOLESTEROL: 124 mg/dL (ref 0–200)
HDL: 38.2 mg/dL — ABNORMAL LOW (ref >39.00)
LDL Cholesterol: 62 mg/dL (ref 0–99)
NonHDL: 85.8
TRIGLYCERIDES: 121 mg/dL (ref 0.0–149.0)
Total CHOL/HDL Ratio: 3
VLDL: 24.2 mg/dL (ref 0.0–40.0)

## 2014-01-25 LAB — CBC WITH DIFFERENTIAL/PLATELET
BASOS PCT: 0.6 % (ref 0.0–3.0)
Basophils Absolute: 0 10*3/uL (ref 0.0–0.1)
EOS PCT: 0.8 % (ref 0.0–5.0)
Eosinophils Absolute: 0 10*3/uL (ref 0.0–0.7)
HCT: 43.3 % (ref 39.0–52.0)
HEMOGLOBIN: 14.1 g/dL (ref 13.0–17.0)
LYMPHS ABS: 1.4 10*3/uL (ref 0.7–4.0)
Lymphocytes Relative: 22.1 % (ref 12.0–46.0)
MCHC: 32.6 g/dL (ref 30.0–36.0)
MCV: 84.3 fl (ref 78.0–100.0)
MONO ABS: 0.5 10*3/uL (ref 0.1–1.0)
MONOS PCT: 7.8 % (ref 3.0–12.0)
NEUTROS PCT: 68.7 % (ref 43.0–77.0)
Neutro Abs: 4.3 10*3/uL (ref 1.4–7.7)
PLATELETS: 203 10*3/uL (ref 150.0–400.0)
RBC: 5.14 Mil/uL (ref 4.22–5.81)
RDW: 16.6 % — AB (ref 11.5–15.5)
WBC: 6.3 10*3/uL (ref 4.0–10.5)

## 2014-01-25 LAB — URINALYSIS, ROUTINE W REFLEX MICROSCOPIC
Bilirubin Urine: NEGATIVE
HGB URINE DIPSTICK: NEGATIVE
Ketones, ur: NEGATIVE
Leukocytes, UA: NEGATIVE
NITRITE: NEGATIVE
PH: 8 (ref 5.0–8.0)
RBC / HPF: NONE SEEN (ref 0–?)
Specific Gravity, Urine: <=1.005 — AB (ref 1.000–1.030)
TOTAL PROTEIN, URINE-UPE24: NEGATIVE
URINE GLUCOSE: NEGATIVE
Urobilinogen, UA: 0.2 (ref 0.0–1.0)
WBC UA: NONE SEEN (ref 0–?)

## 2014-01-25 LAB — HEPATIC FUNCTION PANEL
ALK PHOS: 47 U/L (ref 39–117)
ALT: 24 U/L (ref 0–53)
AST: 27 U/L (ref 0–37)
Albumin: 3.8 g/dL (ref 3.5–5.2)
BILIRUBIN DIRECT: 0.2 mg/dL (ref 0.0–0.3)
TOTAL PROTEIN: 6.5 g/dL (ref 6.0–8.3)
Total Bilirubin: 1.1 mg/dL (ref 0.2–1.2)

## 2014-01-25 LAB — BASIC METABOLIC PANEL
BUN: 13 mg/dL (ref 6–23)
CO2: 28 meq/L (ref 19–32)
Calcium: 9.1 mg/dL (ref 8.4–10.5)
Chloride: 99 mEq/L (ref 96–112)
Creatinine, Ser: 0.9 mg/dL (ref 0.4–1.5)
GFR: 84.61 mL/min (ref >60.00)
GLUCOSE: 94 mg/dL (ref 70–99)
POTASSIUM: 4 meq/L (ref 3.5–5.1)
Sodium: 133 mEq/L — ABNORMAL LOW (ref 135–145)

## 2014-01-25 LAB — TSH: TSH: 1.86 u[IU]/mL (ref 0.35–4.50)

## 2014-01-25 LAB — PSA: PSA: 3.4 ng/mL (ref 0.10–4.00)

## 2014-01-25 MED ORDER — BENZONATATE 100 MG PO CAPS: 100.0000 mg | ORAL_CAPSULE | Freq: Three times a day (TID) | ORAL | Status: DC | PRN

## 2014-01-25 NOTE — Progress Notes (Signed)
Subjective:    Patient ID: Gerald Martinez, male    DOB: 1933/07/12, 80 y.o.   MRN: [id number]  HPI Pt is here for regular wellness examination, and is feeling pretty well in general, and says chronic med probs are stable, except as noted below Past Medical History  Diagnosis Date  . Diabetes mellitus     Type II  . Hypertension   . BPH (benign prostatic hypertrophy)   . Dyslipidemia   . AAA (abdominal aortic aneurysm)   . Hearing loss   . Esophageal cancer   . CAD (coronary artery disease)     Post CABG in 2000.  LIMA to the LAD, SVG to OM, SVG to PDA, last perfusion study in 2012 with no high risk findings    Past Surgical History  Procedure Laterality Date  . Coronary artery bypass graft  2000  . Esophagectomy  2000  . Ventral hernia repair  2001    History   Social History  . Marital Status: Married    Spouse Name: N/A    Number of Children: N/A  . Years of Education: N/A   Occupational History  . Not on file.   Social History Main Topics  . Smoking status: Former Smoker    Quit date: 05/25/1997  . Smokeless tobacco: Never Used  . Alcohol Use: 3.6 oz/week    6 Glasses of wine per week  . Drug Use: No  . Sexual Activity: Not on file   Other Topics Concern  . Not on file   Social History Narrative  . No narrative on file    Current Outpatient Prescriptions on File Prior to Visit  Medication Sig Dispense Refill  . aspirin 325 MG EC tablet Take 325 mg by mouth daily.        Marland Kitchen doxazosin (CARDURA) 4 MG tablet TAKE 1 TABLET BY MOUTH DAILY.  90 tablet  1  . FREESTYLE LITE test strip USE AS INSTRUCTED ONCE DAILY  100 each  11  . furosemide (LASIX) 20 MG tablet TAKE 1 TABLET BY MOUTH DAILY.  90 tablet  1  . gabapentin (NEURONTIN) 300 MG capsule       . Lancets (FREESTYLE) lancets       . lidocaine (LIDODERM) 5 % Place 1 patch onto the skin daily. Remove & Discard patch within 12 hours or as directed by MD      . losartan (COZAAR) 50 MG tablet TAKE 1 TABLET BY  MOUTH ONCE DAILY. (REPLACES DIOVAN)  90 tablet  PRN  . Melatonin 10 MG TABS Take 1 tablet by mouth as needed.      . metFORMIN (GLUCOPHAGE-XR) 500 MG 24 hr tablet TAKE 1 TABLET BY MOUTH DAILY WITH BREAKFAST.  90 tablet  3  . Multiple Vitamin (MULTIVITAMIN) tablet Take 1 tablet by mouth daily.        Marland Kitchen omeprazole (PRILOSEC) 20 MG capsule Take 2 capsules (40 mg total) by mouth daily.  60 capsule  11  . simvastatin (ZOCOR) 40 MG tablet TAKE 1 TABLET BY MOUTH AT BEDTIME.  90 tablet  1  . tamsulosin (FLOMAX) 0.4 MG CAPS capsule       . testosterone (ANDROGEL) 50 MG/5GM (1%) GEL Place 10 g onto the skin daily.  300 g  5   No current facility-administered medications on file prior to visit.    Allergies  Allergen Reactions  . Propoxyphene N-Acetaminophen     Family History  Problem Relation Age of Onset  .  Heart disease Mother   . Stroke Mother   . Cancer Father     Lymphoma  . Cancer Sister     Uncertain  . Colon cancer Neg Hx     BP 128/66  Pulse 67  Temp(Src) 97.7 F (36.5 C) (Oral)  Ht 5\' 11"  (1.803 m)  Wt 216 lb (97.977 kg)  BMI 30.14 kg/m2  SpO2 94%   Review of Systems  Constitutional: Negative for unexpected weight change.  HENT: Negative for hearing loss.   Eyes: Negative for visual disturbance.  Respiratory: Negative for shortness of breath.   Cardiovascular: Negative for chest pain.  Gastrointestinal: Negative for blood in stool.  Endocrine: Negative for cold intolerance.  Genitourinary: Negative for hematuria.  Musculoskeletal: Negative for back pain.  Skin: Negative for rash.  Neurological: Negative for numbness.  Hematological: Does not bruise/bleed easily.  Psychiatric/Behavioral: Negative for dysphoric mood.       Objective:   Physical Exam VS: see vs page GEN: no distress HEAD: head: no deformity eyes: no periorbital swelling, no proptosis external nose and ears are normal mouth: no lesion seen NECK: Neck: a healed scar is present.  i do not  appreciate a nodule in the thyroid or elsewhere in the neck CHEST WALL: no deformity.  Old healed surgical scar (median sternotomy) LUNGS: clear to auscultation BREASTS:  No gynecomastia CV: reg rate and rhythm, no murmur ABD: abdomen is soft, nontender.  no hepatosplenomegaly.  not distended.  no hernia.  Old healed surgical scar (midline). RECTAL: normal external and internal exam.  heme neg. PROSTATE:  Normal size.  No nodule MUSCULOSKELETAL: muscle bulk and strength are grossly normal.  no obvious joint swelling.  gait is normal and steady   PULSES: no carotid bruit NEURO:  cn 2-12 grossly intact.   readily moves all 4's.   SKIN:  Normal texture and temperature.  No rash or suspicious lesion is visible.   NODES:  None palpable at the neck PSYCH: alert, well-oriented.  Does not appear anxious nor depressed.   Lab Results  Component Value Date   WBC 6.3 01/25/2014   HGB 14.1 01/25/2014   HCT 43.3 01/25/2014   PLT 203.0 01/25/2014   GLUCOSE 94 01/25/2014   CHOL 124 01/25/2014   TRIG 121.0 01/25/2014   HDL 38.20* 01/25/2014   LDLCALC 62 01/25/2014   ALT 24 01/25/2014   AST 27 01/25/2014   NA 133* 01/25/2014   K 4.0 01/25/2014   CL 99 01/25/2014   CREATININE 0.9 01/25/2014   BUN 13 01/25/2014   CO2 28 01/25/2014   TSH 1.86 01/25/2014   PSA 3.40 01/25/2014   HGBA1C 6.8* 11/21/2013   MICROALBUR 0.4 02/28/2013       Assessment & Plan:  Wellness visit today, with problems stable, except as noted. we discussed code status.  pt requests full code, but would not want to be started or maintained on artificial life-support measures if there was not a reasonable chance of recovery.  please consider these measures for your health:  minimize alcohol.  do not use tobacco products.  have a colonoscopy at least every 10 years from age 40.   keep firearms safely stored.  always use seat belts.  have working smoke alarms in your home.  see an eye doctor and dentist regularly.  never drive under the influence of alcohol or drugs  (including prescription drugs).  those with fair skin should take precautions against the sun.  it is critically important to prevent falling  down (keep floor areas well-lit, dry, and free of loose objects.  If you have a cane, walker, or wheelchair, you should use it, even for short trips around the house.  Also, try not to rush).    SEPARATE EVALUATION FOLLOWS--EACH PROBLEM HERE IS NEW, NOT RESPONDING TO TREATMENT, OR POSES SIGNIFICANT RISK TO THE PATIENT'S HEALTH:  HISTORY OF THE PRESENT ILLNESS: Pt states few mos of intermittent slight cough in the chest, and assoc nasal congestion.  PAST MEDICAL HISTORY reviewed and up to date today. REVIEW OF SYSTEMS: Denies fever PHYSICAL EXAMINATION: VITAL SIGNS:  See vs page. GENERAL: no distress. Both eac's and tm's are normal.  IMPRESSION: Allergic rhinitis, new PLAN:  Loratadine-d (non-prescription) will help your congestion.   i advised flonase i have sent a prescription to your pharmacy, for [place].

## 2014-01-25 NOTE — Patient Instructions (Addendum)
please consider these measures for your health:  minimize alcohol.  do not use tobacco products.  have a colonoscopy at least every 10 years from age 80.  keep firearms safely stored.  always use seat belts.  have working smoke alarms in your home.  see an eye doctor and dentist regularly.  never drive under the influence of alcohol or drugs (including prescription drugs).  those with fair skin should take precautions against the sun. it is critically important to prevent falling down (keep floor areas well-lit, dry, and free of loose objects.  If you have a cane, walker, or wheelchair, you should use it, even for short trips around the house.  Also, try not to rush). blood tests are being requested for you today.  We'll contact you with results. Please come back for a follow-up appointment in January.   Loratadine-d (non-prescription) will help your congestion. Please let me know if you want a prescription for nasal spray. i have sent a prescription to your pharmacy, for cough pills.

## 2014-02-05 ENCOUNTER — Encounter: Payer: Medicare HMO | Admitting: Endocrinology

## 2014-02-15 ENCOUNTER — Other Ambulatory Visit: Payer: Self-pay | Admitting: Endocrinology

## 2014-02-15 ENCOUNTER — Telehealth: Payer: Self-pay

## 2014-02-16 ENCOUNTER — Other Ambulatory Visit: Payer: Self-pay | Admitting: Endocrinology

## 2014-02-16 ENCOUNTER — Encounter: Payer: Self-pay | Admitting: Endocrinology

## 2014-02-16 MED ORDER — TESTOSTERONE 50 MG/5GM (1%) TD GEL: 10.0000 g | Freq: Every day | TRANSDERMAL | Status: DC

## 2014-02-16 NOTE — Telephone Encounter (Signed)
Rx faxed to pharmacy  

## 2014-02-16 NOTE — Telephone Encounter (Signed)
Received refill request for Adrogel. Med was last refill on 08/16/2013.  Thanks!

## 2014-02-16 NOTE — Telephone Encounter (Signed)
i printed 

## 2014-02-26 ENCOUNTER — Other Ambulatory Visit: Payer: Medicare HMO

## 2014-02-27 ENCOUNTER — Ambulatory Visit: Payer: Medicare HMO

## 2014-03-06 ENCOUNTER — Other Ambulatory Visit: Payer: Self-pay | Admitting: Endocrinology

## 2014-03-08 ENCOUNTER — Other Ambulatory Visit: Payer: Self-pay | Admitting: Endocrinology

## 2014-03-08 NOTE — Telephone Encounter (Signed)
Please advise if ok to refill. Medication is listed under historical provider.

## 2014-03-16 ENCOUNTER — Telehealth: Payer: Self-pay | Admitting: Endocrinology

## 2014-03-16 DIAGNOSIS — E1159 Type 2 diabetes mellitus with other circulatory complications: Secondary | ICD-10-CM

## 2014-03-16 NOTE — Telephone Encounter (Signed)
Pt needs to know whether he needs to come in for a protein level check please call him to let him know

## 2014-03-16 NOTE — Telephone Encounter (Signed)
See below and please advise when you would like for him to come have this test done. Thanks!

## 2014-03-16 NOTE — Telephone Encounter (Signed)
Ok, i ordered 

## 2014-03-16 NOTE — Telephone Encounter (Signed)
i'm sorry, i don't understand.  Do you mean a1c?

## 2014-03-16 NOTE — Telephone Encounter (Signed)
On my chart it indicates a urine protein check is due along with the hemoglobin a1c in december

## 2014-03-16 NOTE — Telephone Encounter (Signed)
See below and please advise, Thanks!  

## 2014-03-28 ENCOUNTER — Other Ambulatory Visit: Payer: Self-pay | Admitting: Endocrinology

## 2014-04-24 ENCOUNTER — Encounter: Payer: Self-pay | Admitting: Endocrinology

## 2014-04-24 ENCOUNTER — Ambulatory Visit (INDEPENDENT_AMBULATORY_CARE_PROVIDER_SITE_OTHER): Payer: Medicare HMO | Admitting: Endocrinology

## 2014-04-24 DIAGNOSIS — E1159 Type 2 diabetes mellitus with other circulatory complications: Secondary | ICD-10-CM

## 2014-04-24 DIAGNOSIS — Z0000 Encounter for general adult medical examination without abnormal findings: Secondary | ICD-10-CM

## 2014-04-24 DIAGNOSIS — H6123 Impacted cerumen, bilateral: Secondary | ICD-10-CM

## 2014-04-24 LAB — MICROALBUMIN / CREATININE URINE RATIO
Creatinine,U: 79.1 mg/dL
Microalb Creat Ratio: 1.4 mg/g (ref 0.0–30.0)
Microalb, Ur: 1.1 mg/dL (ref 0.0–1.9)

## 2014-04-24 LAB — HEMOGLOBIN A1C: Hgb A1c MFr Bld: 6.8 % — ABNORMAL HIGH (ref 4.6–6.5)

## 2014-04-24 MED ORDER — LOSARTAN POTASSIUM 25 MG PO TABS: 25.0000 mg | ORAL_TABLET | Freq: Every day | ORAL | Status: DC

## 2014-04-24 MED ORDER — NEOMYCIN-POLYMYXIN-HC 3.5-10000-1 OT SUSP: 4.0000 [drp] | Freq: Three times a day (TID) | OTIC | Status: DC

## 2014-04-24 NOTE — Patient Instructions (Addendum)
i have sent a prescription to your pharmacy, for ear drops.  blood tests are being requested for you today.  We'll let you know about the results.  Please reduce the losartan.  i have sent a prescription to your pharmacy. Please come back for a follow-up appointment in 4 months.

## 2014-04-24 NOTE — Progress Notes (Signed)
Subjective:    Patient ID: Gerald Martinez, male    DOB: [birth year], 80 y.o.   MRN: [id number]  HPI Pt states 1 days of slightly decreased hearing in both ears, but no assoc pain.  Past Medical History  Diagnosis Date  . Diabetes mellitus     Type II  . Hypertension   . BPH (benign prostatic hypertrophy)   . Dyslipidemia   . AAA (abdominal aortic aneurysm)   . Hearing loss   . Esophageal cancer   . CAD (coronary artery disease)     Post CABG in 2000.  LIMA to the LAD, SVG to OM, SVG to PDA, last perfusion study in 2012 with no high risk findings    Past Surgical History  Procedure Laterality Date  . Coronary artery bypass graft  2000  . Esophagectomy  2000  . Ventral hernia repair  2001    History   Social History  . Marital Status: Married    Spouse Name: N/A    Number of Children: N/A  . Years of Education: N/A   Occupational History  . Not on file.   Social History Main Topics  . Smoking status: Former Smoker    Quit date: 05/25/1997  . Smokeless tobacco: Never Used  . Alcohol Use: 3.6 oz/week    6 Glasses of wine per week  . Drug Use: No  . Sexual Activity: Not on file   Other Topics Concern  . Not on file   Social History Narrative    Current Outpatient Prescriptions on File Prior to Visit  Medication Sig Dispense Refill  . aspirin 325 MG EC tablet Take 325 mg by mouth daily.      Marland Kitchen doxazosin (CARDURA) 4 MG tablet TAKE 1 TABLET BY MOUTH DAILY. 90 tablet 1  . FREESTYLE LITE test strip USE AS INSTRUCTED ONCE DAILY 100 each 11  . furosemide (LASIX) 20 MG tablet TAKE 1 TABLET BY MOUTH DAILY. 90 tablet 1  . gabapentin (NEURONTIN) 300 MG capsule     . Lancets (FREESTYLE) lancets     . lidocaine (LIDODERM) 5 % Place 1 patch onto the skin daily. Remove & Discard patch within 12 hours or as directed by MD    . LIDODERM 5 % PLACE 1 PATCH ONTO THE SKIN DAILY. REMOVE & DISCARD PATCH WITHIN 12 HOURS OR AS DIRECTED BY MD 30 patch 11  . Melatonin 10 MG TABS Take 1  tablet by mouth as needed.    . metFORMIN (GLUCOPHAGE-XR) 500 MG 24 hr tablet TAKE 1 TABLET BY MOUTH DAILY WITH BREAKFAST. 90 tablet 3  . Multiple Vitamin (MULTIVITAMIN) tablet Take 1 tablet by mouth daily.      Marland Kitchen omeprazole (PRILOSEC) 20 MG capsule TAKE 2 CAPSULES (40 MG TOTAL) BY MOUTH DAILY. 180 capsule 11  . simvastatin (ZOCOR) 40 MG tablet TAKE 1 TABLET BY MOUTH AT BEDTIME. 90 tablet 1  . TESTIM 50 MG/5GM (1%) GEL APPLY 10 GRAMS (2 PACKETS) ONTO THE SKIN DAILY. 300 g 4   No current facility-administered medications on file prior to visit.    Allergies  Allergen Reactions  . Propoxyphene N-Acetaminophen     Family History  Problem Relation Age of Onset  . Heart disease Mother   . Stroke Mother   . Cancer Father     Lymphoma  . Cancer Sister     Uncertain  . Colon cancer Neg Hx     BP 122/76 mmHg  Pulse 65  Temp(Src) 97.8  F (36.6 C) (Oral)  Ht 5\' 11"  (1.803 m)  Wt 213 lb (96.616 kg)  BMI 29.72 kg/m2  SpO2 97%   Review of Systems Denies fever and nasal congestion.    Objective:   Physical Exam VITAL SIGNS:  See vs page GENERAL: no distress Both tm's are occluded with cerumen Pulses: dorsalis pedis intact bilat.  Feet: no deformity.  There is bilateral onychomycosis. Old healed surgical scars (bilat vein harvest). 1+ bilat leg edema.  Skin: no ulcer on the feet. feet are of normal color and temp Neuro: sensation is intact to touch on the feet, but decreased from normal.   Lab Results  Component Value Date   HGBA1C 6.8* 04/24/2014       Assessment & Plan:  Cerumen impaction: new Intervention: cerumen is removed from both eac's with ear curette Repeat exam: much less cerumen, but slight blood i have sent a prescription to your pharmacy, for ear drops.    DM: well-controlled.  Please continue the same metformin.   Subjective:   Patient here for Medicare annual wellness visit and management of other chronic and acute problems.     Risk factors:  advanced age    54 of Physicians Providing Medical Care to Patient:  See "snapshot"   Activities of Daily Living: In your present state of health, do you have any difficulty performing the following activities?:  Preparing food and eating?: No  Bathing yourself: No  Getting dressed: No  Using the toilet:No  Moving around from place to place: No  In the past year have you fallen or had a near fall?: seldom   Home Safety: Has smoke detector and wears seat belts. No firearms. No excess sun exposure.  Diet and Exercise  Current exercise habits: pt says good Dietary issues discussed: pt reports a healthy diet   Depression Screen  Q1: Over the past two weeks, have you felt down, depressed or hopeless? no  Q2: Over the past two weeks, have you felt little interest or pleasure in doing things? no   The following portions of the patient's history were reviewed and updated as appropriate: allergies, current medications, past family history, past medical history, past social history, past surgical history and problem list.   Review of Systems  Denies hearing loss, and visual loss Objective:   Vision:  Sees opthalmologist Hearing: grossly normal Body mass index:  See vs page Msk: pt easily and quickly performs "get-up-and-go" from a sitting position Cognitive Impairment Assessment: cognition, memory and judgment appear normal.  remembers 3/3 at 5 minutes.  excellent recall.  can easily read and write a sentence.  alert and oriented x 3.    Assessment:   Medicare wellness utd on preventive parameters    Plan:   During the course of the visit the patient was educated and counseled about appropriate screening and preventive services including:       Fall prevention   Screening mammography  Bone densitometry screening  Diabetes screening  Nutrition counseling   Vaccines / LABS Zostavax / Pneumococcal Vaccine  today  PSA  Patient Instructions (the written plan) was given to the  patient.

## 2014-05-01 ENCOUNTER — Ambulatory Visit: Payer: Medicare HMO

## 2014-05-01 ENCOUNTER — Ambulatory Visit (INDEPENDENT_AMBULATORY_CARE_PROVIDER_SITE_OTHER): Payer: Medicare HMO

## 2014-05-01 DIAGNOSIS — M79673 Pain in unspecified foot: Secondary | ICD-10-CM

## 2014-05-01 DIAGNOSIS — E1140 Type 2 diabetes mellitus with diabetic neuropathy, unspecified: Secondary | ICD-10-CM

## 2014-05-01 DIAGNOSIS — B351 Tinea unguium: Secondary | ICD-10-CM

## 2014-05-01 NOTE — Progress Notes (Signed)
   Subjective:    Patient ID: Gerald Martinez, male    DOB: 17-Nov-1933, 80 y.o.   MRN: [id number]  HPI  Pt presents for nail debridement  Review of Systems no new findings or systemic changes noted     Objective:   Physical Exam Neurovascular status is intact DP and PT 1 over 4 Refill time 3 seconds decreased sensation Semmes Weinstein to the forefoot and digits noted nails thick Crumley friable discolored and brittle one through 5 bilateral. No open wounds no ulcers no secondary infections patient will be getting new orthoses in January of this year with when orthotics skin patient's convenience in the near future.       Assessment & Plan:  Assessment this time is diabetes history peripheral neuropathy and angiopathy painful brittle Crumley from mycotic nails debrided 1 through 4 bilateral return for future palliative nail care every 3 months as recommended next  Harriet Masson DPM

## 2014-06-04 DIAGNOSIS — E1140 Type 2 diabetes mellitus with diabetic neuropathy, unspecified: Secondary | ICD-10-CM

## 2014-06-04 DIAGNOSIS — M722 Plantar fascial fibromatosis: Secondary | ICD-10-CM

## 2014-06-19 DIAGNOSIS — M79673 Pain in unspecified foot: Secondary | ICD-10-CM

## 2014-08-07 ENCOUNTER — Other Ambulatory Visit: Payer: Self-pay

## 2014-08-07 ENCOUNTER — Other Ambulatory Visit: Payer: Self-pay | Admitting: Endocrinology

## 2014-08-13 ENCOUNTER — Telehealth: Payer: Self-pay | Admitting: *Deleted

## 2014-08-13 NOTE — Telephone Encounter (Signed)
"  Reason I'm calling.  Dr Blenda Mounts didn't send my note backing up why I need orthotics.  I'd like him to send a report to insurance stating I'm Diabetic and I can't walk without them.  I already paid the $149.  He had to do this before and it got covered.  Please take care of that and let me know.  If they won't pay for them, I still want to know.  Give me a call.

## 2014-08-23 ENCOUNTER — Encounter: Payer: Self-pay | Admitting: Endocrinology

## 2014-08-23 LAB — HM DIABETES EYE EXAM

## 2014-08-28 ENCOUNTER — Other Ambulatory Visit: Payer: Medicare HMO

## 2014-08-30 ENCOUNTER — Other Ambulatory Visit: Payer: Self-pay | Admitting: Endocrinology

## 2014-09-07 ENCOUNTER — Encounter: Payer: Self-pay | Admitting: Podiatry

## 2014-09-07 ENCOUNTER — Ambulatory Visit (INDEPENDENT_AMBULATORY_CARE_PROVIDER_SITE_OTHER): Payer: Medicare HMO | Admitting: Podiatry

## 2014-09-07 VITALS — BP 119/57 | HR 60 | Resp 18

## 2014-09-07 DIAGNOSIS — B351 Tinea unguium: Secondary | ICD-10-CM

## 2014-09-07 DIAGNOSIS — E1140 Type 2 diabetes mellitus with diabetic neuropathy, unspecified: Secondary | ICD-10-CM

## 2014-09-07 DIAGNOSIS — M79676 Pain in unspecified toe(s): Secondary | ICD-10-CM

## 2014-09-10 ENCOUNTER — Encounter: Payer: Self-pay | Admitting: Podiatry

## 2014-09-10 NOTE — Progress Notes (Signed)
Patient ID: Gerald Martinez, male   DOB: 1934/03/18, 81 y.o.   MRN: [id number]  Subjective: 81 y.o.-year-old male returns the office today for painful, elongated, thickened toenails. Denies any redness or drainage around the nails. Denies any acute changes since last appointment and no new complaints today. Denies any systemic complaints such as fevers, chills, nausea, vomiting.   Objective: AAO 3, NAD DP/PT pulses palpable 1/4 , CRT less than 3 seconds Protective sensation decreased with Simms Weinstein monofilament, Achilles tendon reflex intact.  Nails hypertrophic, dystrophic, elongated, brittle, discolored 10. There is tenderness overlying the nails 1-5 bilaterally. There is no surrounding erythema or drainage along the nail sites. No open lesions or pre-ulcerative lesions are identified. No other areas of tenderness bilateral lower extremities. No overlying edema, erythema, increased warmth. No pain with calf compression, swelling, warmth, erythema.  Assessment: Patient presents with symptomatic onychomycosis  Plan: -Treatment options including alternatives, risks, complications were discussed -Nails sharply debrided 10 without complication/bleeding. -Discussed daily foot inspection. If there are any changes, to call the office immediately.  -Follow-up in 3 months or sooner if any problems are to arise. In the meantime, encouraged to call the office with any questions, concerns, changes symptoms.

## 2014-09-25 ENCOUNTER — Encounter: Payer: Self-pay | Admitting: Internal Medicine

## 2014-12-07 ENCOUNTER — Encounter: Payer: Self-pay | Admitting: Podiatry

## 2014-12-07 ENCOUNTER — Ambulatory Visit (INDEPENDENT_AMBULATORY_CARE_PROVIDER_SITE_OTHER): Payer: Medicare HMO | Admitting: Podiatry

## 2014-12-07 DIAGNOSIS — B351 Tinea unguium: Secondary | ICD-10-CM | POA: Diagnosis not present

## 2014-12-07 DIAGNOSIS — E1140 Type 2 diabetes mellitus with diabetic neuropathy, unspecified: Secondary | ICD-10-CM

## 2014-12-07 DIAGNOSIS — M79676 Pain in unspecified toe(s): Secondary | ICD-10-CM | POA: Diagnosis not present

## 2014-12-07 NOTE — Progress Notes (Signed)
Patient ID: Gerald Martinez, male   DOB: 1934-01-17, 81 y.o.   MRN: [id number]  Subjective: 80 y.o.-year-old male returns the office today for painful, elongated, thickened toenails. Denies any redness or drainage around the nails. Denies any acute changes since last appointment and no new complaints today. Denies any systemic complaints such as fevers, chills, nausea, vomiting.   Objective: AAO 3, NAD DP/PT pulses palpable 1/4 , CRT less than 3 seconds Protective sensation decreased with Simms Weinstein monofilament, Achilles tendon reflex intact.  Nails hypertrophic, dystrophic, elongated, brittle, discolored 10. There is tenderness overlying the nails 1-5 bilaterally. There is no surrounding erythema or drainage along the nail sites. No open lesions or pre-ulcerative lesions are identified. No other areas of tenderness bilateral lower extremities. No overlying edema, erythema, increased warmth. No pain with calf compression, swelling, warmth, erythema.  Assessment: Patient presents with symptomatic onychomycosis  Plan: -Treatment options including alternatives, risks, complications were discussed -Nails sharply debrided 10 without complication/bleeding. -Discussed daily foot inspection. If there are any changes, to call the office immediately.  -Follow-up in 3 months or sooner if any problems are to arise. In the meantime, encouraged to call the office with any questions, concerns, changes symptoms.  Celesta Gentile, DPM

## 2014-12-08 ENCOUNTER — Other Ambulatory Visit: Payer: Self-pay | Admitting: Endocrinology

## 2015-01-17 ENCOUNTER — Ambulatory Visit (INDEPENDENT_AMBULATORY_CARE_PROVIDER_SITE_OTHER): Payer: Medicare HMO | Admitting: Cardiology

## 2015-01-17 ENCOUNTER — Encounter: Payer: Self-pay | Admitting: Cardiology

## 2015-01-17 VITALS — BP 120/66 | HR 58 | Ht 71.0 in | Wt 216.8 lb

## 2015-01-17 DIAGNOSIS — I2510 Atherosclerotic heart disease of native coronary artery without angina pectoris: Secondary | ICD-10-CM

## 2015-01-17 NOTE — Patient Instructions (Signed)
Your physician wants you to follow-up in: 1 Year. You will receive a reminder letter in the mail two months in advance. If you don't receive a letter, please call our office to schedule the follow-up appointment.  Take Benadryl as needed to help you sleep

## 2015-01-17 NOTE — Progress Notes (Signed)
HPI Patient presents for followup of his known coronary disease. Since I last saw him he has had no new cardiovascular complaints. He remains active. He went to [place] again. He has had no new symptoms since he had a stress perfusion study in 2012. The patient denies any new symptoms such as chest discomfort, neck or arm discomfort. There has been no new shortness of breath, PND or orthopnea. There have been no reported palpitations, presyncope or syncope.  Allergies  Allergen Reactions  . Propoxyphene N-Acetaminophen     Current Outpatient Prescriptions  Medication Sig Dispense Refill  . amoxicillin (AMOXIL) 500 MG capsule     . aspirin 325 MG EC tablet Take 325 mg by mouth daily.      Marland Kitchen doxazosin (CARDURA) 4 MG tablet TAKE 1 TABLET BY MOUTH DAILY. 90 tablet 1  . FREESTYLE LITE test strip USE AS INSTRUCTED ONCE DAILY 100 each 11  . furosemide (LASIX) 20 MG tablet TAKE 1 TABLET BY MOUTH DAILY. 90 tablet 1  . Lancets (FREESTYLE) lancets     . lidocaine (LIDODERM) 5 % Place 1 patch onto the skin daily. Remove & Discard patch within 12 hours or as directed by MD    . losartan (COZAAR) 25 MG tablet Take 1 tablet (25 mg total) by mouth daily. 90 tablet 3  . Melatonin 10 MG TABS Take 1 tablet by mouth as needed.    . metFORMIN (GLUCOPHAGE-XR) 500 MG 24 hr tablet TAKE 1 TABLET BY MOUTH DAILY WITH BREAKFAST. 90 tablet 3  . Multiple Vitamin (MULTIVITAMIN) tablet Take 1 tablet by mouth daily.      Marland Kitchen neomycin-polymyxin-hydrocortisone (CORTISPORIN) 3.5-10000-1 otic suspension Place 4 drops into both ears 3 (three) times daily. 20 mL 0  . omeprazole (PRILOSEC) 20 MG capsule TAKE 2 CAPSULES (40 MG TOTAL) BY MOUTH DAILY. 180 capsule 11  . simvastatin (ZOCOR) 40 MG tablet TAKE 1 TABLET BY MOUTH AT BEDTIME. 90 tablet 1  . TESTIM 50 MG/5GM (1%) GEL APPLY 10 GRAMS (2 PACKETS) ONTO THE SKIN DAILY. 300 g 5   No current facility-administered medications for this visit.    Past Medical History    Diagnosis Date  . Diabetes mellitus     Type II  . Hypertension   . BPH (benign prostatic hypertrophy)   . Dyslipidemia   . AAA (abdominal aortic aneurysm)   . Hearing loss   . Esophageal cancer   . CAD (coronary artery disease)     Post CABG in 2000.  LIMA to the LAD, SVG to OM, SVG to PDA, last perfusion study in 2012 with no high risk findings    Past Surgical History  Procedure Laterality Date  . Coronary artery bypass graft  2000  . Esophagectomy  2000  . Ventral hernia repair  2001    ROS: As stated in the HPI and negative for all other systems.  PHYSICAL EXAM BP 120/66 mmHg  Pulse 58  Ht 5\' 11"  (1.803 m)  Wt 216 lb 12.8 oz (98.34 kg)  BMI 30.25 kg/m2 GENERAL:  Well appearing  NECK:  No jugular venous distention, waveform within normal limits, carotid upstroke brisk and symmetric, no bruits, no thyromegaly LUNGS:  Clear to auscultation bilaterally BACK:  No CVA tenderness CHEST:  Well healed sternotomy scar. HEART:  PMI not displaced or sustained,S1 and S2 within normal limits, no S3, no S4, no clicks, no rubs, no murmurs ABD:  Flat, positive bowel sounds normal in frequency in pitch, no bruits, no  rebound, no guarding, no midline pulsatile mass, no hepatomegaly, no splenomegaly, nonhealing abdominal wounds EXT:  2 plus pulses throughout, mild right greater than left leg edema, no cyanosis no clubbing, venous stasis changes   EKG:  Sinus rhythm, rate 58, axis within normal limits, intervals within normal limits, no acute ST-T wave changes.  01/17/2015   ASSESSMENT AND PLAN  CORONARY ATHEROSCLEROSIS NATIVE CORONARY ARTERY -  He has had no new symptoms since his stress test in 2012. No further cardiovascular testing is suggested. He will continue with risk reduction.  HYPERTENSION -  The blood pressure is at target. No change in medications is indicated. We will continue with therapeutic lifestyle changes (TLC).  HYPERCHOLESTEROLEMIA -  He will have this  followed up again when he sees Renato Shin, MD next month.  His lipids last year were excellent  Overweight -  The patient understands the need to lose weight with diet and exercise.     AAA - He had a stable repair last year and no further testing is indicated.

## 2015-01-29 ENCOUNTER — Encounter: Payer: Self-pay | Admitting: Endocrinology

## 2015-01-29 ENCOUNTER — Ambulatory Visit (INDEPENDENT_AMBULATORY_CARE_PROVIDER_SITE_OTHER): Payer: Medicare HMO | Admitting: Endocrinology

## 2015-01-29 VITALS — BP 130/82 | HR 92 | Temp 98.1°F | Ht 71.0 in | Wt 213.0 lb

## 2015-01-29 DIAGNOSIS — E7800 Pure hypercholesterolemia, unspecified: Secondary | ICD-10-CM

## 2015-01-29 DIAGNOSIS — Z0000 Encounter for general adult medical examination without abnormal findings: Secondary | ICD-10-CM | POA: Diagnosis not present

## 2015-01-29 DIAGNOSIS — E299 Testicular dysfunction, unspecified: Secondary | ICD-10-CM

## 2015-01-29 DIAGNOSIS — D509 Iron deficiency anemia, unspecified: Secondary | ICD-10-CM | POA: Diagnosis not present

## 2015-01-29 DIAGNOSIS — E1159 Type 2 diabetes mellitus with other circulatory complications: Secondary | ICD-10-CM

## 2015-01-29 DIAGNOSIS — Z125 Encounter for screening for malignant neoplasm of prostate: Secondary | ICD-10-CM

## 2015-01-29 DIAGNOSIS — I10 Essential (primary) hypertension: Secondary | ICD-10-CM

## 2015-01-29 DIAGNOSIS — Z23 Encounter for immunization: Secondary | ICD-10-CM

## 2015-01-29 LAB — IBC PANEL
IRON: 45 ug/dL (ref 42–165)
Saturation Ratios: 10.8 % — ABNORMAL LOW (ref 20.0–50.0)
TRANSFERRIN: 299 mg/dL (ref 212.0–360.0)

## 2015-01-29 LAB — BASIC METABOLIC PANEL
BUN: 11 mg/dL (ref 6–23)
CO2: 29 mEq/L (ref 19–32)
CREATININE: 0.86 mg/dL (ref 0.40–1.50)
Calcium: 9 mg/dL (ref 8.4–10.5)
Chloride: 101 mEq/L (ref 96–112)
GFR: 91.22 mL/min (ref >60.00)
Glucose, Bld: 83 mg/dL (ref 70–99)
POTASSIUM: 4.6 meq/L (ref 3.5–5.1)
Sodium: 138 mEq/L (ref 135–145)

## 2015-01-29 LAB — HEPATIC FUNCTION PANEL
ALBUMIN: 4.1 g/dL (ref 3.5–5.2)
ALT: 23 U/L (ref 0–53)
AST: 22 U/L (ref 0–37)
Alkaline Phosphatase: 55 U/L (ref 39–117)
Bilirubin, Direct: 0.1 mg/dL (ref 0.0–0.3)
TOTAL PROTEIN: 6.4 g/dL (ref 6.0–8.3)
Total Bilirubin: 0.5 mg/dL (ref 0.2–1.2)

## 2015-01-29 LAB — CBC WITH DIFFERENTIAL/PLATELET
BASOS PCT: 0.4 % (ref 0.0–3.0)
Basophils Absolute: 0 10*3/uL (ref 0.0–0.1)
EOS ABS: 0.1 10*3/uL (ref 0.0–0.7)
Eosinophils Relative: 1.5 % (ref 0.0–5.0)
HEMATOCRIT: 42 % (ref 39.0–52.0)
Hemoglobin: 13.6 g/dL (ref 13.0–17.0)
LYMPHS ABS: 1.3 10*3/uL (ref 0.7–4.0)
Lymphocytes Relative: 21.2 % (ref 12.0–46.0)
MCHC: 32.4 g/dL (ref 30.0–36.0)
MCV: 81.2 fl (ref 78.0–100.0)
MONO ABS: 0.6 10*3/uL (ref 0.1–1.0)
Monocytes Relative: 10.6 % (ref 3.0–12.0)
NEUTROS ABS: 4 10*3/uL (ref 1.4–7.7)
NEUTROS PCT: 66.3 % (ref 43.0–77.0)
PLATELETS: 186 10*3/uL (ref 150.0–400.0)
RBC: 5.18 Mil/uL (ref 4.22–5.81)
RDW: 18.2 % — AB (ref 11.5–15.5)
WBC: 6.1 10*3/uL (ref 4.0–10.5)

## 2015-01-29 LAB — LIPID PANEL
CHOL/HDL RATIO: 3
Cholesterol: 111 mg/dL (ref 0–200)
HDL: 39.9 mg/dL (ref >39.00)
LDL Cholesterol: 49 mg/dL (ref 0–99)
NONHDL: 70.86
Triglycerides: 108 mg/dL (ref 0.0–149.0)
VLDL: 21.6 mg/dL (ref 0.0–40.0)

## 2015-01-29 LAB — URINALYSIS, ROUTINE W REFLEX MICROSCOPIC
BILIRUBIN URINE: NEGATIVE
HGB URINE DIPSTICK: NEGATIVE
Ketones, ur: NEGATIVE
Nitrite: NEGATIVE
PH: 7.5 (ref 5.0–8.0)
RBC / HPF: NONE SEEN (ref 0–?)
Specific Gravity, Urine: 1.01 (ref 1.000–1.030)
Total Protein, Urine: NEGATIVE
UROBILINOGEN UA: 0.2 (ref 0.0–1.0)
Urine Glucose: NEGATIVE

## 2015-01-29 LAB — MICROALBUMIN / CREATININE URINE RATIO
CREATININE, U: 29.1 mg/dL
MICROALB/CREAT RATIO: 2.7 mg/g (ref 0.0–30.0)
Microalb, Ur: 0.8 mg/dL (ref 0.0–1.9)

## 2015-01-29 LAB — TSH: TSH: 2.34 u[IU]/mL (ref 0.35–4.50)

## 2015-01-29 LAB — PSA: PSA: 2.86 ng/mL (ref 0.10–4.00)

## 2015-01-29 LAB — POCT GLYCOSYLATED HEMOGLOBIN (HGB A1C): Hemoglobin A1C: 6.1

## 2015-01-29 NOTE — Progress Notes (Signed)
we discussed code status.  pt requests full code, but would not want to be started or maintained on artificial life-support measures if there was not a reasonable chance of recovery 

## 2015-01-29 NOTE — Patient Instructions (Signed)
please consider these measures for your health:  minimize alcohol.  do not use tobacco products.  have a colonoscopy at least every 10 years from age 81.   keep firearms safely stored.  always use seat belts.  have working smoke alarms in your home.  see an eye doctor and dentist regularly.  never drive under the  influence of alcohol or drugs (including prescription drugs).  those with fair skin should take precautions against the sun. it is critically important to prevent falling down (keep floor areas well-lit, dry, and free of loose objects.  If you have a cane, walker, or wheelchair, you should use it, even for short trips around the house.  Also, try not to rush).   blood tests are requested for you today.  We'll let you know about the results. Please come back for a follow-up appointment in 6 months.

## 2015-01-29 NOTE — Progress Notes (Signed)
Subjective:    Patient ID: Gerald Martinez, male    DOB: 1933-07-20, 81 y.o.   MRN: [id number]  HPI Pt is here for regular wellness examination, and is feeling pretty well in general, and says chronic med probs are stable, except as noted below Past Medical History  Diagnosis Date  . Diabetes mellitus     Type II  . Hypertension   . BPH (benign prostatic hypertrophy)   . Dyslipidemia   . AAA (abdominal aortic aneurysm)   . Hearing loss   . Esophageal cancer   . CAD (coronary artery disease)     Post CABG in 2000.  LIMA to the LAD, SVG to OM, SVG to PDA, last perfusion study in 2012 with no high risk findings    Past Surgical History  Procedure Laterality Date  . Coronary artery bypass graft  2000  . Esophagectomy  2000  . Ventral hernia repair  2001    Social History   Social History  . Marital Status: Married    Spouse Name: N/A  . Number of Children: N/A  . Years of Education: N/A   Occupational History  . Not on file.   Social History Main Topics  . Smoking status: Former Smoker    Quit date: 05/25/1997  . Smokeless tobacco: Never Used  . Alcohol Use: 3.6 oz/week    6 Glasses of wine per week  . Drug Use: No  . Sexual Activity: Not on file   Other Topics Concern  . Not on file   Social History Narrative    Current Outpatient Prescriptions on File Prior to Visit  Medication Sig Dispense Refill  . amoxicillin (AMOXIL) 500 MG capsule     . aspirin 325 MG EC tablet Take 325 mg by mouth daily.      Marland Kitchen doxazosin (CARDURA) 4 MG tablet TAKE 1 TABLET BY MOUTH DAILY. 90 tablet 1  . FREESTYLE LITE test strip USE AS INSTRUCTED ONCE DAILY 100 each 11  . furosemide (LASIX) 20 MG tablet TAKE 1 TABLET BY MOUTH DAILY. 90 tablet 1  . Lancets (FREESTYLE) lancets     . lidocaine (LIDODERM) 5 % Place 1 patch onto the skin daily. Remove & Discard patch within 12 hours or as directed by MD    . losartan (COZAAR) 25 MG tablet Take 1 tablet (25 mg total) by mouth daily. 90  tablet 3  . Melatonin 10 MG TABS Take 1 tablet by mouth as needed.    . Multiple Vitamin (MULTIVITAMIN) tablet Take 1 tablet by mouth daily.      Marland Kitchen omeprazole (PRILOSEC) 20 MG capsule TAKE 2 CAPSULES (40 MG TOTAL) BY MOUTH DAILY. 180 capsule 11  . simvastatin (ZOCOR) 40 MG tablet TAKE 1 TABLET BY MOUTH AT BEDTIME. 90 tablet 1  . TESTIM 50 MG/5GM (1%) GEL APPLY 10 GRAMS (2 PACKETS) ONTO THE SKIN DAILY. 300 g 5  . metFORMIN (GLUCOPHAGE-XR) 500 MG 24 hr tablet TAKE 1 TABLET BY MOUTH DAILY WITH BREAKFAST. (Patient not taking: Reported on 01/29/2015) 90 tablet 3   No current facility-administered medications on file prior to visit.    Allergies  Allergen Reactions  . Propoxyphene N-Acetaminophen     Family History  Problem Relation Age of Onset  . Heart disease Mother   . Stroke Mother   . Cancer Father     Lymphoma  . Cancer Sister     Uncertain  . Colon cancer Neg Hx     BP 130/82  mmHg  Pulse 92  Temp(Src) 98.1 F (36.7 C) (Oral)  Ht 5\' 11"  (1.803 m)  Wt 213 lb (96.616 kg)  BMI 29.72 kg/m2  SpO2 97%  Review of Systems  Constitutional: Negative for fever and unexpected weight change.  HENT: Negative for hearing loss.   Eyes: Negative for visual disturbance.  Respiratory:       No change in chronic doe  Cardiovascular: Negative for chest pain.  Gastrointestinal: Negative for blood in stool.  Endocrine: Negative for cold intolerance.  Genitourinary: Negative for hematuria and difficulty urinating.  Musculoskeletal: Negative for gait problem.  Skin: Negative for rash.  Allergic/Immunologic: Negative for environmental allergies.  Neurological: Negative for syncope, numbness and headaches.  Hematological: Bruises/bleeds easily.  Psychiatric/Behavioral: Negative for dysphoric mood.       Objective:   Physical Exam VS: see vs page GEN: no distress HEAD: head: no deformity eyes: no periorbital swelling, no proptosis external nose and ears are normal mouth: no lesion  seen NECK: supple, thyroid is not enlarged CHEST WALL: no deformity LUNGS: clear to auscultation BREASTS:  No gynecomastia CV: reg rate and rhythm, no murmur ABD: abdomen is soft, nontender.  no hepatosplenomegaly.  not distended.  no hernia RECTAL: normal external and internal exam.  heme neg. PROSTATE:  Normal size.  No nodule MUSCULOSKELETAL: muscle bulk and strength are grossly normal.  no obvious joint swelling.  gait is normal and steady EXTEMITIES: no deformity.  no ulcer on the feet.  feet are of normal temp, but cyanotic while dependent.  1+ bilat leg edema.  There is bilateral onychomycosis. Old healed surgical scars (bilat vein harvest).   PULSES: dorsalis pedis intact bilat.  no carotid bruit NEURO:  cn 2-12 grossly intact.   readily moves all 4's.  sensation is intact to touch on the feet, but decreased from normal. SKIN:  Normal texture and temperature.  No rash or suspicious lesion is visible.   NODES:  None palpable at the neck PSYCH: alert, well-oriented.  Does not appear anxious nor depressed.     Assessment & Plan:  Wellness visit today, with problems stable, except as noted.   Patient is advised the following: Patient Instructions  please consider these measures for your health:  minimize alcohol.  do not use tobacco products.  have a colonoscopy at least every 10 years from age 21.   keep firearms safely stored.  always use seat belts.  have working smoke alarms in your home.  see an eye doctor and dentist regularly.  never drive under the  influence of alcohol or drugs (including prescription drugs).  those with fair skin should take precautions against the sun. it is critically important to prevent falling down (keep floor areas well-lit, dry, and free of loose objects.  If you have a cane, walker, or wheelchair, you should use it, even for short trips around the house.  Also, try not to rush).   blood tests are requested for you today.  We'll let you know about the  results. Please come back for a follow-up appointment in 6 months.

## 2015-01-30 LAB — TESTOSTERONE,FREE AND TOTAL
TESTOSTERONE FREE: 10.4 pg/mL (ref 6.6–18.1)
TESTOSTERONE: 558 ng/dL (ref 348–1197)

## 2015-01-31 ENCOUNTER — Telehealth: Payer: Self-pay | Admitting: Endocrinology

## 2015-01-31 NOTE — Telephone Encounter (Signed)
please call patient: Labs are normal--good.

## 2015-01-31 NOTE — Telephone Encounter (Signed)
Pt advised of note below and voiced understanding.  

## 2015-02-27 ENCOUNTER — Emergency Department (HOSPITAL_COMMUNITY)
Admission: EM | Admit: 2015-02-27 | Discharge: 2015-02-27 | Disposition: A | Discharge status: Home / Self Care | Payer: Medicare HMO | Attending: Emergency Medicine | Admitting: Emergency Medicine

## 2015-02-27 ENCOUNTER — Other Ambulatory Visit: Payer: Self-pay | Admitting: Endocrinology

## 2015-02-27 ENCOUNTER — Emergency Department (HOSPITAL_COMMUNITY): Payer: Medicare HMO

## 2015-02-27 ENCOUNTER — Telehealth: Payer: Self-pay | Admitting: Endocrinology

## 2015-02-27 DIAGNOSIS — Z792 Long term (current) use of antibiotics: Secondary | ICD-10-CM | POA: Diagnosis not present

## 2015-02-27 DIAGNOSIS — I10 Essential (primary) hypertension: Secondary | ICD-10-CM | POA: Diagnosis not present

## 2015-02-27 DIAGNOSIS — H9190 Unspecified hearing loss, unspecified ear: Secondary | ICD-10-CM | POA: Diagnosis not present

## 2015-02-27 DIAGNOSIS — R42 Dizziness and giddiness: Secondary | ICD-10-CM | POA: Insufficient documentation

## 2015-02-27 DIAGNOSIS — E785 Hyperlipidemia, unspecified: Secondary | ICD-10-CM | POA: Insufficient documentation

## 2015-02-27 DIAGNOSIS — I2510 Atherosclerotic heart disease of native coronary artery without angina pectoris: Secondary | ICD-10-CM | POA: Insufficient documentation

## 2015-02-27 DIAGNOSIS — R51 Headache: Secondary | ICD-10-CM | POA: Diagnosis not present

## 2015-02-27 DIAGNOSIS — Z79899 Other long term (current) drug therapy: Secondary | ICD-10-CM | POA: Insufficient documentation

## 2015-02-27 DIAGNOSIS — Z8501 Personal history of malignant neoplasm of esophagus: Secondary | ICD-10-CM | POA: Diagnosis not present

## 2015-02-27 DIAGNOSIS — E119 Type 2 diabetes mellitus without complications: Secondary | ICD-10-CM | POA: Diagnosis not present

## 2015-02-27 DIAGNOSIS — Z7982 Long term (current) use of aspirin: Secondary | ICD-10-CM | POA: Diagnosis not present

## 2015-02-27 DIAGNOSIS — Z87891 Personal history of nicotine dependence: Secondary | ICD-10-CM | POA: Insufficient documentation

## 2015-02-27 LAB — CBC WITH DIFFERENTIAL/PLATELET
BASOS ABS: 0 10*3/uL (ref 0.0–0.1)
Basophils Relative: 0 %
EOS PCT: 1 %
Eosinophils Absolute: 0.1 10*3/uL (ref 0.0–0.7)
HCT: 40.3 % (ref 39.0–52.0)
Hemoglobin: 13 g/dL (ref 13.0–17.0)
LYMPHS PCT: 21 %
Lymphs Abs: 1.1 10*3/uL (ref 0.7–4.0)
MCH: 26.7 pg (ref 26.0–34.0)
MCHC: 32.3 g/dL (ref 30.0–36.0)
MCV: 82.8 fL (ref 78.0–100.0)
MONO ABS: 0.4 10*3/uL (ref 0.1–1.0)
Monocytes Relative: 7 %
Neutro Abs: 3.8 10*3/uL (ref 1.7–7.7)
Neutrophils Relative %: 71 %
PLATELETS: 155 10*3/uL (ref 150–400)
RBC: 4.87 MIL/uL (ref 4.22–5.81)
RDW: 17.5 % — AB (ref 11.5–15.5)
WBC: 5.3 10*3/uL (ref 4.0–10.5)

## 2015-02-27 LAB — BASIC METABOLIC PANEL
Anion gap: 8 (ref 5–15)
BUN: 12 mg/dL (ref 6–20)
CALCIUM: 8.5 mg/dL — AB (ref 8.9–10.3)
CO2: 26 mmol/L (ref 22–32)
CREATININE: 0.9 mg/dL (ref 0.61–1.24)
Chloride: 98 mmol/L — ABNORMAL LOW (ref 101–111)
GFR calc Af Amer: >60 mL/min (ref >60)
GLUCOSE: 121 mg/dL — AB (ref 65–99)
Potassium: 3.9 mmol/L (ref 3.5–5.1)
Sodium: 132 mmol/L — ABNORMAL LOW (ref 135–145)

## 2015-02-27 LAB — CBG MONITORING, ED: GLUCOSE-CAPILLARY: 123 mg/dL — AB (ref 65–99)

## 2015-02-27 MED ORDER — MECLIZINE HCL 12.5 MG PO TABS: 12.5000 mg | ORAL_TABLET | Freq: Three times a day (TID) | ORAL | Status: DC | PRN

## 2015-02-27 NOTE — ED Notes (Signed)
Patient transported to MRI 

## 2015-02-27 NOTE — ED Notes (Signed)
Patient states woke up at 0505 this morning. Patient has had ear problems for pat week. Patient felt very dizziness and lightheaded. Patient started taking benadryl at night for sleep for about a month.

## 2015-02-27 NOTE — ED Notes (Signed)
PT moved to POD -E  Per request  Of PA.

## 2015-02-27 NOTE — ED Notes (Signed)
Pt in MRI during hourly round  

## 2015-02-27 NOTE — Telephone Encounter (Signed)
Patient stated that he has a bad case of vertigo,and cant hardly move, can you send something to the pharmacy, please advise

## 2015-02-27 NOTE — Telephone Encounter (Signed)
See note below and please advise, Thanks! 

## 2015-02-27 NOTE — ED Provider Notes (Signed)
CSN: [id number]     Arrival date & time 02/27/15  1108 History   First MD Initiated Contact with Patient 02/27/15 1127     Chief Complaint  Patient presents with  . Dizziness     (Consider location/radiation/quality/duration/timing/severity/associated sxs/prior Treatment) Patient is a 81 y.o. male presenting with dizziness.  Dizziness Quality:  Room spinning Severity:  Severe Onset quality:  Sudden Duration:  2 hours Timing:  Constant Progression:  Partially resolved Chronicity:  New Context comment:  Spontaneous while standing, not orthostatic Relieved by:  Nothing Worsened by:  Movement and turning head Associated symptoms: hearing loss (muffled) and nausea   Associated symptoms: no blood in stool, no chest pain, no diarrhea, no syncope (but with near syncope) and no vomiting     Past Medical History  Diagnosis Date  . Diabetes mellitus     Type II  . Hypertension   . BPH (benign prostatic hypertrophy)   . Dyslipidemia   . AAA (abdominal aortic aneurysm)   . Hearing loss   . Esophageal cancer   . CAD (coronary artery disease)     Post CABG in 2000.  LIMA to the LAD, SVG to OM, SVG to PDA, last perfusion study in 2012 with no high risk findings   Past Surgical History  Procedure Laterality Date  . Coronary artery bypass graft  2000  . Esophagectomy  2000  . Ventral hernia repair  2001   Family History  Problem Relation Age of Onset  . Heart disease Mother   . Stroke Mother   . Cancer Father     Lymphoma  . Cancer Sister     Uncertain  . Colon cancer Neg Hx    Social History  Substance Use Topics  . Smoking status: Former Smoker    Quit date: 05/25/1997  . Smokeless tobacco: Never Used  . Alcohol Use: 3.6 oz/week    6 Glasses of wine per week    Review of Systems  HENT: Positive for hearing loss (muffled).   Cardiovascular: Negative for chest pain and syncope (but with near syncope).  Gastrointestinal: Positive for nausea. Negative for vomiting,  diarrhea and blood in stool.  Neurological: Positive for dizziness.  All other systems reviewed and are negative.     Allergies  Propoxyphene n-acetaminophen  Home Medications   Prior to Admission medications   Medication Sig Start Date End Date Taking? Authorizing Provider  amoxicillin (AMOXIL) 500 MG capsule  07/04/14   Historical Provider, MD  aspirin 325 MG EC tablet Take 325 mg by mouth daily.      Historical Provider, MD  doxazosin (CARDURA) 4 MG tablet TAKE 1 TABLET BY MOUTH DAILY. 08/30/14   Renato Shin, MD  FREESTYLE LITE test strip USE AS INSTRUCTED ONCE DAILY 01/16/14   Renato Shin, MD  furosemide (LASIX) 20 MG tablet TAKE 1 TABLET BY MOUTH DAILY. 08/30/14   Renato Shin, MD  Lancets (FREESTYLE) lancets  08/21/13   Historical Provider, MD  lidocaine (LIDODERM) 5 % Place 1 patch onto the skin daily. Remove & Discard patch within 12 hours or as directed by MD    Historical Provider, MD  losartan (COZAAR) 25 MG tablet Take 1 tablet (25 mg total) by mouth daily. 04/24/14   Renato Shin, MD  meclizine (ANTIVERT) 12.5 MG tablet Take 1 tablet (12.5 mg total) by mouth 3 (three) times daily as needed for dizziness. 02/27/15   Debby Freiberg, MD  Melatonin 10 MG TABS Take 1 tablet by mouth as needed.  Historical Provider, MD  metFORMIN (GLUCOPHAGE-XR) 500 MG 24 hr tablet TAKE 1 TABLET BY MOUTH DAILY WITH BREAKFAST. Patient not taking: Reported on 01/29/2015 12/10/14   Renato Shin, MD  Multiple Vitamin (MULTIVITAMIN) tablet Take 1 tablet by mouth daily.      Historical Provider, MD  omeprazole (PRILOSEC) 20 MG capsule TAKE 2 CAPSULES (40 MG TOTAL) BY MOUTH DAILY. 03/28/14   Renato Shin, MD  simvastatin (ZOCOR) 40 MG tablet TAKE 1 TABLET BY MOUTH AT BEDTIME. 03/06/14   Renato Shin, MD  TESTIM 50 MG/5GM (1%) GEL APPLY 10 GRAMS (2 PACKETS) ONTO THE SKIN DAILY. 08/07/14   Renato Shin, MD   BP 143/67 mmHg  Pulse 53  Temp(Src) 98.3 F (36.8 C) (Oral)  Resp 16  Ht 5' 11.5" (1.816 m)  Wt 213  lb (96.616 kg)  BMI 29.30 kg/m2  SpO2 95% Physical Exam  Constitutional: He is oriented to person, place, and time. He appears well-developed and well-nourished.  HENT:  Head: Normocephalic and atraumatic.  Eyes: Conjunctivae and EOM are normal.  Neck: Normal range of motion. Neck supple.  Cardiovascular: Normal rate, regular rhythm and normal heart sounds.   Pulmonary/Chest: Effort normal and breath sounds normal. No respiratory distress.  Abdominal: He exhibits no distension. There is no tenderness. There is no rebound and no guarding.  Musculoskeletal: Normal range of motion.  Neurological: He is alert and oriented to person, place, and time. He has normal strength and normal reflexes. No cranial nerve deficit or sensory deficit. Gait (unsteady) abnormal. GCS eye subscore is 4. GCS verbal subscore is 5. GCS motor subscore is 6.  Skin: Skin is warm and dry.  Vitals reviewed.   ED Course  Procedures (including critical care time) Labs Review Labs Reviewed  CBC WITH DIFFERENTIAL/PLATELET - Abnormal; Notable for the following:    RDW 17.5 (*)    All other components within normal limits  BASIC METABOLIC PANEL - Abnormal; Notable for the following:    Sodium 132 (*)    Chloride 98 (*)    Glucose, Bld 121 (*)    Calcium 8.5 (*)    All other components within normal limits  CBG MONITORING, ED - Abnormal; Notable for the following:    Glucose-Capillary 123 (*)    All other components within normal limits    Imaging Review Mr Brain Wo Contrast  02/27/2015   CLINICAL DATA:  Dizziness and lightheadedness upon awakening earlier today. No neurologic deficits are reported.  EXAM: MRI HEAD WITHOUT CONTRAST  TECHNIQUE: Multiplanar, multiecho pulse sequences of the brain and surrounding structures were obtained without intravenous contrast.  COMPARISON:  None.  FINDINGS: No evidence for acute infarction, hemorrhage, mass lesion, hydrocephalus, or extra-axial fluid. Mild cerebral and  cerebellar atrophy, not unexpected for age. Minor white matter disease, nonspecific.  Pituitary, pineal, and cerebellar tonsils unremarkable. No upper cervical lesions.  Flow voids are maintained throughout the carotid, basilar, and BILATERAL vertebral arteries. There are no areas of chronic hemorrhage.  Thin-section imaging through the posterior fossa reveals no vascular loop or mass lesion.  Extracranial soft tissues unremarkable.  IMPRESSION: No acute intracranial findings. Specifically no evidence for large vessel occlusion or posterior fossa seen.   Electronically Signed   By: Staci Righter M.D.   On: 02/27/2015 14:13   I have personally reviewed and evaluated these images and lab results as part of my medical decision-making.   EKG Interpretation None      MDM   Final diagnoses:  Vertigo  81 y.o. male with pertinent PMH of AAA, DM, HTN, CAD presents with likely vertigo.  Pt has no ho similar.  No other neuro deficit by history.  Exam on arrival with L nystagmus on head turn, HINTS exam suggestive of L peripheral.  Pt states symptoms resolved prior to arrival, but states he is still unsteady on walking.  MR obtained and unremarkable.  DC home in stable condition, likely vertigo.    I have reviewed all laboratory and imaging studies if ordered as above  1. Vertigo         Debby Freiberg, MD 02/27/15 [phone]

## 2015-02-27 NOTE — Discharge Instructions (Signed)
Benign Positional Vertigo Vertigo is the feeling that you or your surroundings are moving when they are not. Benign positional vertigo is the most common form of vertigo. The cause of this condition is not serious (is benign). This condition is triggered by certain movements and positions (is positional). This condition can be dangerous if it occurs while you are doing something that could endanger you or others, such as driving.  CAUSES In many cases, the cause of this condition is not known. It may be caused by a disturbance in an area of the inner ear that helps your brain to sense movement and balance. This disturbance can be caused by a viral infection (labyrinthitis), head injury, or repetitive motion. RISK FACTORS This condition is more likely to develop in: 1. Women. 2. People who are 45 years of age or older. SYMPTOMS Symptoms of this condition usually happen when you move your head or your eyes in different directions. Symptoms may start suddenly, and they usually last for less than a minute. Symptoms may include:  Loss of balance and falling.  Feeling like you are spinning or moving.  Feeling like your surroundings are spinning or moving.  Nausea and vomiting.  Blurred vision.  Dizziness.  Involuntary eye movement (nystagmus). Symptoms can be mild and cause only slight annoyance, or they can be severe and interfere with daily life. Episodes of benign positional vertigo may return (recur) over time, and they may be triggered by certain movements. Symptoms may improve over time. DIAGNOSIS This condition is usually diagnosed by medical history and a physical exam of the head, neck, and ears. You may be referred to a health care provider who specializes in ear, nose, and throat (ENT) problems (otolaryngologist) or a provider who specializes in disorders of the nervous system (neurologist). You may have additional testing, including:  MRI.  A CT scan.  Eye movement tests. Your  health care provider may ask you to change positions quickly while he or she watches you for symptoms of benign positional vertigo, such as nystagmus. Eye movement may be tested with an electronystagmogram (ENG), caloric stimulation, the Dix-Hallpike test, or the roll test.  An electroencephalogram (EEG). This records electrical activity in your brain.  Hearing tests. TREATMENT Usually, your health care provider will treat this by moving your head in specific positions to adjust your inner ear back to normal. Surgery may be needed in severe cases, but this is rare. In some cases, benign positional vertigo may resolve on its own in 2-4 weeks. HOME CARE INSTRUCTIONS Safety  Move slowly.Avoid sudden body or head movements.  Avoid driving.  Avoid operating heavy machinery.  Avoid doing any tasks that would be dangerous to you or others if a vertigo episode would occur.  If you have trouble walking or keeping your balance, try using a cane for stability. If you feel dizzy or unstable, sit down right away.  Return to your normal activities as told by your health care provider. Ask your health care provider what activities are safe for you. General Instructions  Take over-the-counter and prescription medicines only as told by your health care provider.  Avoid certain positions or movements as told by your health care provider.  Drink enough fluid to keep your urine clear or pale yellow.  Keep all follow-up visits as told by your health care provider. This is important. SEEK MEDICAL CARE IF:  You have a fever.  Your condition gets worse or you develop new symptoms.  Your family or friends  notice any behavioral changes.  Your nausea or vomiting gets worse.  You have numbness or a "pins and needles" sensation. SEEK IMMEDIATE MEDICAL CARE IF:  You have difficulty speaking or moving.  You are always dizzy.  You faint.  You develop severe headaches.  You have weakness in your  legs or arms.  You have changes in your hearing or vision.  You develop a stiff neck.  You develop sensitivity to light.   This information is not intended to replace advice given to you by your health care provider. Make sure you discuss any questions you have with your health care provider.   Document Released: 02/16/2006 Document Revised: 01/30/2015 Document Reviewed: 09/03/2014 Elsevier Interactive Patient Education 2016 [employer]. [employer] Self-Care WHAT IS THE EPLEY MANEUVER? The Epley maneuver is an exercise you can do to relieve symptoms of benign paroxysmal positional vertigo (BPPV). This condition is often just referred to as vertigo. BPPV is caused by the movement of tiny crystals (canaliths) inside your inner ear. The accumulation and movement of canaliths in your inner ear causes a sudden spinning sensation (vertigo) when you move your head to certain positions. Vertigo usually lasts about 30 seconds. BPPV usually occurs in just one ear. If you get vertigo when you lie on your left side, you probably have BPPV in your left ear. Your health care provider can tell you which ear is involved.  BPPV may be caused by a head injury. Many people older than 50 get BPPV for unknown reasons. If you have been diagnosed with BPPV, your health care provider may teach you how to do this maneuver. BPPV is not life threatening (benign) and usually goes away in time.  WHEN SHOULD I PERFORM THE EPLEY MANEUVER? You can do this maneuver at home whenever you have symptoms of vertigo. You may do the Epley maneuver up to 3 times a day until your symptoms of vertigo go away. HOW SHOULD I DO THE EPLEY MANEUVER? 3. Sit on the edge of a bed or table with your back straight. Your legs should be extended or hanging over the edge of the bed or table.  4. Turn your head halfway toward the affected ear.  5. Lie backward quickly with your head turned until you are lying flat on your back. You may want to  position a pillow under your shoulders.  6. Hold this position for 30 seconds. You may experience an attack of vertigo. This is normal. Hold this position until the vertigo stops. 7. Then turn your head to the opposite direction until your unaffected ear is facing the floor.  8. Hold this position for 30 seconds. You may experience an attack of vertigo. This is normal. Hold this position until the vertigo stops. 9. Now turn your whole body to the same side as your head. Hold for another 30 seconds.  10. You can then sit back up. ARE THERE RISKS TO THIS MANEUVER? In some cases, you may have other symptoms (such as changes in your vision, weakness, or numbness). If you have these symptoms, stop doing the maneuver and call your health care provider. Even if doing these maneuvers relieves your vertigo, you may still have dizziness. Dizziness is the sensation of light-headedness but without the sensation of movement. Even though the Epley maneuver may relieve your vertigo, it is possible that your symptoms will return within 5 years. WHAT SHOULD I DO AFTER THIS MANEUVER? After doing the Epley maneuver, you can return to your normal  activities. Ask your doctor if there is anything you should do at home to prevent vertigo. This may include:  Sleeping with two or more pillows to keep your head elevated.  Not sleeping on the side of your affected ear.  Getting up slowly from bed.  Avoiding sudden movements during the day.  Avoiding extreme head movement, like looking up or bending over.  Wearing a cervical collar to prevent sudden head movements. WHAT SHOULD I DO IF MY SYMPTOMS GET WORSE? Call your health care provider if your vertigo gets worse. Call your provider right way if you have other symptoms, including:   Nausea.  Vomiting.  Headache.  Weakness.  Numbness.  Vision changes.   This information is not intended to replace advice given to you by your health care provider. Make  sure you discuss any questions you have with your health care provider.   Document Released: 05/16/2013 Document Reviewed: 05/16/2013 Elsevier Interactive Patient Education [employer].

## 2015-02-27 NOTE — Telephone Encounter (Signed)
I contacted the pt and advised of note below. Patient was hesitant at first about going to the ER. Patient was advised if the dizzy spells are coming from a more serious condition the staff in the ER would have the equipment to help him. Patient stated he would go to the ER.

## 2015-02-27 NOTE — Telephone Encounter (Signed)
There are many possible causes of dizziness, some of which could be serious.  Go to ER now.

## 2015-02-28 NOTE — Telephone Encounter (Signed)
Please call in the testim for the pt please

## 2015-03-01 DIAGNOSIS — Z87898 Personal history of other specified conditions: Secondary | ICD-10-CM | POA: Diagnosis not present

## 2015-03-01 DIAGNOSIS — H7403 Tympanosclerosis, bilateral: Secondary | ICD-10-CM | POA: Diagnosis not present

## 2015-03-12 ENCOUNTER — Encounter: Payer: Self-pay | Admitting: Podiatry

## 2015-03-12 ENCOUNTER — Other Ambulatory Visit: Payer: Self-pay | Admitting: Endocrinology

## 2015-03-12 ENCOUNTER — Ambulatory Visit (INDEPENDENT_AMBULATORY_CARE_PROVIDER_SITE_OTHER): Payer: Medicare HMO | Admitting: Podiatry

## 2015-03-12 DIAGNOSIS — M79676 Pain in unspecified toe(s): Secondary | ICD-10-CM | POA: Diagnosis not present

## 2015-03-12 DIAGNOSIS — E1140 Type 2 diabetes mellitus with diabetic neuropathy, unspecified: Secondary | ICD-10-CM

## 2015-03-12 DIAGNOSIS — B351 Tinea unguium: Secondary | ICD-10-CM | POA: Diagnosis not present

## 2015-03-12 NOTE — Progress Notes (Addendum)
Patient ID: Gerald Martinez, male   DOB: June 10, 1933, 80 y.o.   MRN: [id number] Complaint:  Visit Type: Patient returns to my office for continued preventative foot care services. Complaint: Patient states" my nails have grown long and thick and become painful to walk and wear shoes" Patient has been diagnosed with DM with no foot complications. The patient presents for preventative foot care services. No changes to ROS  Podiatric Exam: Vascular: dorsalis pedis and posterior tibial pulses are palpable bilateral. Capillary return is immediate. Temperature gradient is WNL. Skin turgor WNL  Sensorium: Diminished l Semmes Weinstein monofilament test. Normal tactile sensation bilaterally. Nail Exam: Pt has thick disfigured discolored nails with subungual debris noted bilateral entire nail hallux through fifth toenails Ulcer Exam: There is no evidence of ulcer or pre-ulcerative changes or infection. Orthopedic Exam: Muscle tone and strength are WNL. No limitations in general ROM. No crepitus or effusions noted. Foot type and digits show no abnormalities. Bony prominences are unremarkable. Skin: No Porokeratosis. No infection or ulcers  Diagnosis:  Onychomycosis, , Pain in right toe, pain in left toes  Treatment & Plan Procedures and Treatment: Consent by patient was obtained for treatment procedures. The patient understood the discussion of treatment and procedures well. All questions were answered thoroughly reviewed. Debridement of mycotic and hypertrophic toenails, 1 through 5 bilateral and clearing of subungual debris. No ulceration, no infection noted. Bleeding noted left hallux and bandaged with neosporin/ DSD Return Visit-Office Procedure: Patient instructed to return to the office for a follow up visit 3 months for continued evaluation and treatment.

## 2015-03-19 ENCOUNTER — Other Ambulatory Visit: Payer: Self-pay | Admitting: Endocrinology

## 2015-04-06 ENCOUNTER — Other Ambulatory Visit: Payer: Self-pay | Admitting: Endocrinology

## 2015-04-08 NOTE — Telephone Encounter (Signed)
Please advise if ok to refill. Medication is listed under a historical provider.  Thanks!  

## 2015-04-15 ENCOUNTER — Other Ambulatory Visit: Payer: Self-pay | Admitting: Endocrinology

## 2015-04-19 ENCOUNTER — Other Ambulatory Visit: Payer: Self-pay | Admitting: Endocrinology

## 2015-04-26 ENCOUNTER — Other Ambulatory Visit: Payer: Self-pay | Admitting: Endocrinology

## 2015-05-28 ENCOUNTER — Encounter: Payer: Self-pay | Admitting: Endocrinology

## 2015-05-28 ENCOUNTER — Ambulatory Visit (INDEPENDENT_AMBULATORY_CARE_PROVIDER_SITE_OTHER): Payer: Medicare HMO | Admitting: Endocrinology

## 2015-05-28 VITALS — BP 137/68 | HR 69 | Temp 97.6°F | Ht 71.0 in | Wt 214.0 lb

## 2015-05-28 DIAGNOSIS — R42 Dizziness and giddiness: Secondary | ICD-10-CM

## 2015-05-28 DIAGNOSIS — E1159 Type 2 diabetes mellitus with other circulatory complications: Secondary | ICD-10-CM

## 2015-05-28 LAB — POCT GLYCOSYLATED HEMOGLOBIN (HGB A1C): Hemoglobin A1C: 6.3

## 2015-05-28 NOTE — Patient Instructions (Addendum)
Please see a neurology specialist.  Also, let's check the circulation in the neck arteries.  you will receive a phone call, about days and times for a these appointments.  Please continue the same medication for diabetes.

## 2015-05-28 NOTE — Progress Notes (Signed)
Subjective:    Patient ID: Gerald Martinez, male    DOB: 1933-09-08, 82 y.o.   MRN: [id number]  HPI 3 mos ago, pt was seen in ER for severe vertigenous-quality dizziness, and assoc vomiting.  sxs resolved a few hrs later, but have recurred twice since then.   Past Medical History  Diagnosis Date  . Diabetes mellitus     Type II  . Hypertension   . BPH (benign prostatic hypertrophy)   . Dyslipidemia   . AAA (abdominal aortic aneurysm) ([place])   . Hearing loss   . Esophageal cancer ([place])   . CAD (coronary artery disease)     Post CABG in 2000.  LIMA to the LAD, SVG to OM, SVG to PDA, last perfusion study in 2012 with no high risk findings    Past Surgical History  Procedure Laterality Date  . Coronary artery bypass graft  2000  . Esophagectomy  2000  . Ventral hernia repair  2001    Social History   Social History  . Marital Status: Married    Spouse Name: N/A  . Number of Children: N/A  . Years of Education: N/A   Occupational History  . Not on file.   Social History Main Topics  . Smoking status: Former Smoker    Quit date: 05/25/1997  . Smokeless tobacco: Never Used  . Alcohol Use: 3.6 oz/week    6 Glasses of wine per week  . Drug Use: No  . Sexual Activity: Not on file   Other Topics Concern  . Not on file   Social History Narrative    Current Outpatient Prescriptions on File Prior to Visit  Medication Sig Dispense Refill  . amoxicillin (AMOXIL) 500 MG capsule     . aspirin 325 MG EC tablet Take 325 mg by mouth daily.      Marland Kitchen doxazosin (CARDURA) 4 MG tablet TAKE 1 TABLET BY MOUTH DAILY. 90 tablet 1  . FREESTYLE LITE test strip USE AS INSTRUCTED ONCE DAILY 100 each 11  . furosemide (LASIX) 20 MG tablet TAKE 1 TABLET BY MOUTH DAILY. 90 tablet 1  . Lancets (FREESTYLE) lancets     . lidocaine (LIDODERM) 5 % Place 1 patch onto the skin daily. Remove & Discard patch within 12 hours or as directed by MD    . LIDODERM 5 % PLACE 1 PATCH ONTO THE SKIN DAILY. REMOVE  & DISCARD PATCH WITHIN 12 HOURS OR AS DIRECTED BY MD 30 patch 11  . losartan (COZAAR) 25 MG tablet Take 1 tablet (25 mg total) by mouth daily. 90 tablet 3  . meclizine (ANTIVERT) 12.5 MG tablet Take 1 tablet (12.5 mg total) by mouth 3 (three) times daily as needed for dizziness. 30 tablet 0  . Melatonin 10 MG TABS Take 1 tablet by mouth as needed.    . metFORMIN (GLUCOPHAGE-XR) 500 MG 24 hr tablet TAKE 1 TABLET BY MOUTH DAILY WITH BREAKFAST. 90 tablet 3  . Multiple Vitamin (MULTIVITAMIN) tablet Take 1 tablet by mouth daily.      Marland Kitchen omeprazole (PRILOSEC) 20 MG capsule TAKE 2 CAPSULES (40 MG TOTAL) BY MOUTH DAILY. 180 capsule 11  . simvastatin (ZOCOR) 40 MG tablet TAKE 1 TABLET BY MOUTH AT BEDTIME. 90 tablet 1  . simvastatin (ZOCOR) 40 MG tablet TAKE 1 TABLET BY MOUTH AT BEDTIME. 90 tablet 1  . TESTIM 50 MG/5GM (1%) GEL APPLY THE CONTENTS OF 2 TUBES ONTO THE SKIN DAILY. 300 g 5   No  current facility-administered medications on file prior to visit.    Allergies  Allergen Reactions  . Propoxyphene N-Acetaminophen     Family History  Problem Relation Age of Onset  . Heart disease Mother   . Stroke Mother   . Cancer Father     Lymphoma  . Cancer Sister     Uncertain  . Colon cancer Neg Hx     BP 137/68 mmHg  Pulse 69  Temp(Src) 97.6 F (36.4 C) (Oral)  Ht 5\' 11"  (1.803 m)  Wt 214 lb (97.07 kg)  BMI 29.86 kg/m2  SpO2 96%    Review of Systems Denies LOC and headache    Objective:   Physical Exam VS: see vs page GEN: no distress HEAD: head: no deformity eyes: no periorbital swelling, no proptosis external nose and ears are normal mouth: no lesion seen Both eac's and tm's are normal NECK: supple. CHEST WALL: no deformity LUNGS: clear to auscultation CV: reg rate and rhythm, no murmur MUSCULOSKELETAL: muscle bulk and strength are grossly normal.  no obvious joint swelling.  gait is normal and steady EXTEMITIES: no deformity.  no ulcer on the feet.  feet are of normal  color and temp.  no edema PULSES: dorsalis pedis intact bilat.  no carotid bruit NEURO:  cn 2-12 grossly intact.   readily moves all 4's.  sensation is intact to touch on the feet SKIN:  Normal texture and temperature.  No rash or suspicious lesion is visible.   NODES:  None palpable at the neck PSYCH: alert, well-oriented.  Does not appear anxious nor depressed.    A1c=6.3% MRI: NAD    Assessment & Plan:  Dizziness, new to me DM: well-controlled   Patient is advised the following: Patient Instructions  Please see a neurology specialist.  Also, let's check the circulation in the neck arteries.  you will receive a phone call, about days and times for a these appointments.  Please continue the same medication for diabetes.

## 2015-06-02 ENCOUNTER — Encounter: Payer: Self-pay | Admitting: Endocrinology

## 2015-06-03 ENCOUNTER — Other Ambulatory Visit: Payer: Self-pay | Admitting: Endocrinology

## 2015-06-03 MED ORDER — MECLIZINE HCL 12.5 MG PO TABS: 12.5000 mg | ORAL_TABLET | Freq: Three times a day (TID) | ORAL | Status: DC | PRN

## 2015-06-04 ENCOUNTER — Ambulatory Visit: Payer: Medicare HMO | Admitting: Podiatry

## 2015-06-19 ENCOUNTER — Telehealth: Payer: Self-pay | Admitting: Endocrinology

## 2015-06-19 ENCOUNTER — Ambulatory Visit
Admission: RE | Admit: 2015-06-19 | Discharge: 2015-06-19 | Disposition: A | Discharge status: Home / Self Care | Payer: Medicare HMO | Source: Ambulatory Visit | Attending: Endocrinology | Admitting: Endocrinology

## 2015-06-19 ENCOUNTER — Encounter: Payer: Self-pay | Admitting: Endocrinology

## 2015-06-19 ENCOUNTER — Ambulatory Visit (INDEPENDENT_AMBULATORY_CARE_PROVIDER_SITE_OTHER): Payer: Medicare HMO | Admitting: Endocrinology

## 2015-06-19 VITALS — BP 126/86 | HR 73 | Temp 97.8°F | Ht 71.0 in | Wt 216.0 lb

## 2015-06-19 DIAGNOSIS — R059 Cough, unspecified: Secondary | ICD-10-CM

## 2015-06-19 DIAGNOSIS — R0602 Shortness of breath: Secondary | ICD-10-CM | POA: Diagnosis not present

## 2015-06-19 DIAGNOSIS — R05 Cough: Secondary | ICD-10-CM

## 2015-06-19 MED ORDER — AZITHROMYCIN 500 MG PO TABS: 500.0000 mg | ORAL_TABLET | Freq: Every day | ORAL | Status: DC

## 2015-06-19 MED ORDER — FLUTICASONE-SALMETEROL 100-50 MCG/DOSE IN AEPB: 1.0000 | INHALATION_SPRAY | Freq: Two times a day (BID) | RESPIRATORY_TRACT | Status: DC

## 2015-06-19 MED ORDER — PROMETHAZINE-CODEINE 6.25-10 MG/5ML PO SYRP: 5.0000 mL | ORAL_SOLUTION | ORAL | Status: DC | PRN

## 2015-06-19 NOTE — Patient Instructions (Addendum)
A chest x-ray is requested for you today.  We'll let you know about the results. i have sent 2 prescriptions to your pharmacy: antibiotic and inhaler. Here is a prescription for cough syrup. Loratadine-d (non-prescription) will help your congestion. I hope you feel better soon.  If you don't feel better by next week, please call back.  Please call sooner if you get worse.

## 2015-06-19 NOTE — Telephone Encounter (Signed)
It is with avs papers i gave to pt

## 2015-06-19 NOTE — Telephone Encounter (Signed)
See note below and please advise. Did the pt take this rx with him?

## 2015-06-19 NOTE — Telephone Encounter (Signed)
Patient stated the pharmacy did not receive the prescription for the cough syurp, please advise

## 2015-06-19 NOTE — Telephone Encounter (Signed)
I contacted the pt and advised of note below. Pt voiced understanding.  

## 2015-06-19 NOTE — Telephone Encounter (Signed)
Pharmacy calling [facility] for the cough med that Dr.Ellison said he would call in for him

## 2015-06-19 NOTE — Progress Notes (Signed)
Subjective:    Patient ID: Gerald Martinez, male    DOB: 12/15/1933, 82 y.o.   MRN: [id number]  HPI Pt states 2 weeks of moderate prod-quality cough in the chest, but no assoc fever.  Past Medical History  Diagnosis Date  . Diabetes mellitus     Type II  . Hypertension   . BPH (benign prostatic hypertrophy)   . Dyslipidemia   . AAA (abdominal aortic aneurysm) ([place])   . Hearing loss   . Esophageal cancer ([place])   . CAD (coronary artery disease)     Post CABG in 2000.  LIMA to the LAD, SVG to OM, SVG to PDA, last perfusion study in 2012 with no high risk findings    Past Surgical History  Procedure Laterality Date  . Coronary artery bypass graft  2000  . Esophagectomy  2000  . Ventral hernia repair  2001    Social History   Social History  . Marital Status: Married    Spouse Name: N/A  . Number of Children: N/A  . Years of Education: N/A   Occupational History  . Not on file.   Social History Main Topics  . Smoking status: Former Smoker    Quit date: 05/25/1997  . Smokeless tobacco: Never Used  . Alcohol Use: 3.6 oz/week    6 Glasses of wine per week  . Drug Use: No  . Sexual Activity: Not on file   Other Topics Concern  . Not on file   Social History Narrative    Current Outpatient Prescriptions on File Prior to Visit  Medication Sig Dispense Refill  . amoxicillin (AMOXIL) 500 MG capsule     . aspirin 325 MG EC tablet Take 325 mg by mouth daily.      Marland Kitchen doxazosin (CARDURA) 4 MG tablet TAKE 1 TABLET BY MOUTH DAILY. 90 tablet 1  . FREESTYLE LITE test strip USE AS INSTRUCTED ONCE DAILY 100 each 11  . furosemide (LASIX) 20 MG tablet TAKE 1 TABLET BY MOUTH DAILY. 90 tablet 1  . Lancets (FREESTYLE) lancets     . lidocaine (LIDODERM) 5 % Place 1 patch onto the skin daily. Remove & Discard patch within 12 hours or as directed by MD    . LIDODERM 5 % PLACE 1 PATCH ONTO THE SKIN DAILY. REMOVE & DISCARD PATCH WITHIN 12 HOURS OR AS DIRECTED BY MD 30 patch 11  .  losartan (COZAAR) 25 MG tablet Take 1 tablet (25 mg total) by mouth daily. 90 tablet 3  . meclizine (ANTIVERT) 12.5 MG tablet Take 1 tablet (12.5 mg total) by mouth 3 (three) times daily as needed for dizziness. 30 tablet 3  . Melatonin 10 MG TABS Take 1 tablet by mouth as needed.    . metFORMIN (GLUCOPHAGE-XR) 500 MG 24 hr tablet TAKE 1 TABLET BY MOUTH DAILY WITH BREAKFAST. 90 tablet 3  . Multiple Vitamin (MULTIVITAMIN) tablet Take 1 tablet by mouth daily.      Marland Kitchen omeprazole (PRILOSEC) 20 MG capsule TAKE 2 CAPSULES (40 MG TOTAL) BY MOUTH DAILY. 180 capsule 11  . simvastatin (ZOCOR) 40 MG tablet TAKE 1 TABLET BY MOUTH AT BEDTIME. 90 tablet 1  . simvastatin (ZOCOR) 40 MG tablet TAKE 1 TABLET BY MOUTH AT BEDTIME. 90 tablet 1  . TESTIM 50 MG/5GM (1%) GEL APPLY THE CONTENTS OF 2 TUBES ONTO THE SKIN DAILY. 300 g 5   No current facility-administered medications on file prior to visit.    Allergies  Allergen  Reactions  . Propoxyphene N-Acetaminophen     Family History  Problem Relation Age of Onset  . Heart disease Mother   . Stroke Mother   . Cancer Father     Lymphoma  . Cancer Sister     Uncertain  . Colon cancer Neg Hx     BP 126/86 mmHg  Pulse 73  Temp(Src) 97.8 F (36.6 C) (Oral)  Ht 5\' 11"  (1.803 m)  Wt 216 lb (97.977 kg)  BMI 30.14 kg/m2  SpO2 93%  Review of Systems Denies SOB, but he has nasal congestion.      Objective:   Physical Exam VITAL SIGNS:  See vs page GENERAL: no distress head: no deformity eyes: no periorbital swelling, no proptosis external nose and ears are normal mouth: no lesion seen Both eac's and tm's are normal LUNGS:  Clear to auscultation.    CXR: NAD.    Assessment & Plan:  Acute bronchitis, new.    Patient is advised the following: Patient Instructions  A chest x-ray is requested for you today.  We'll let you know about the results. i have sent 2 prescriptions to your pharmacy: antibiotic and inhaler. Here is a prescription for  cough syrup. Loratadine-d (non-prescription) will help your congestion. I hope you feel better soon.  If you don't feel better by next week, please call back.  Please call sooner if you get worse.

## 2015-06-24 DIAGNOSIS — H3342 Traction detachment of retina, left eye: Secondary | ICD-10-CM | POA: Diagnosis not present

## 2015-06-25 ENCOUNTER — Ambulatory Visit (INDEPENDENT_AMBULATORY_CARE_PROVIDER_SITE_OTHER): Payer: Medicare HMO | Admitting: Neurology

## 2015-06-25 ENCOUNTER — Encounter: Payer: Self-pay | Admitting: Neurology

## 2015-06-25 VITALS — BP 134/88 | HR 73 | Ht 71.5 in | Wt 212.0 lb

## 2015-06-25 DIAGNOSIS — R42 Dizziness and giddiness: Secondary | ICD-10-CM

## 2015-06-25 NOTE — Patient Instructions (Signed)
I think the vertigo was probably inner ear.  I wouldn't order any other tests at this point.  Call with any questions or concerns

## 2015-06-25 NOTE — Progress Notes (Addendum)
NEUROLOGY CONSULTATION NOTE  CHUEYEE KOEPPEL MRN: [id number] DOB: 1933/06/23  Referring provider: Dr. Loanne Drilling Primary care provider: Dr. Loanne Drilling  Reason for consult:  Vertigo  HISTORY OF PRESENT ILLNESS: Gerald Martinez is a 82 year old right-handed male with type 2 diabetes, neuropathy, hypertension, BPH, CAD, dyslipidemia and history of esophageal cancer who presents for dizziness.  History obtained by patient, ED note and PCP note.  Imaging of brain MRI reviewed.  On 02/27/15, he was in the kitchen when he had sudden onset of vertigo, described as the room moving up and down like a see-saw.  There was associated nausea.  There was no associated headache, double vision, slurred speech or focal numbness or weakness.  He felt better when completely still.  Movement triggered it.  It lasted 5 to 6 hours but gradually lessened.  He presented to the ED.  MRI of brain was unremarkable.  Exam findings in the ED appeared to favor peripheral etiology.  He had a recurrence about a week later, with severe nausea and vomiting, this time lasting 2 hours and resolved.  He has not had recurrent spells.  He has remote history of inner ear problems, including perforated ear drum.  PAST MEDICAL HISTORY: Past Medical History  Diagnosis Date  . Diabetes mellitus     Type II  . Hypertension   . BPH (benign prostatic hypertrophy)   . Dyslipidemia   . AAA (abdominal aortic aneurysm) ([place])   . Hearing loss   . Esophageal cancer ([place])   . CAD (coronary artery disease)     Post CABG in 2000.  LIMA to the LAD, SVG to OM, SVG to PDA, last perfusion study in 2012 with no high risk findings    PAST SURGICAL HISTORY: Past Surgical History  Procedure Laterality Date  . Coronary artery bypass graft  2000  . Esophagectomy  2000  . Ventral hernia repair  2001    MEDICATIONS: Current Outpatient Prescriptions on File Prior to Visit  Medication Sig Dispense Refill  . aspirin 325 MG EC tablet Take 325 mg by mouth  daily.      Marland Kitchen doxazosin (CARDURA) 4 MG tablet TAKE 1 TABLET BY MOUTH DAILY. 90 tablet 1  . Fluticasone-Salmeterol (ADVAIR) 100-50 MCG/DOSE AEPB Inhale 1 puff into the lungs 2 (two) times daily. 1 each 3  . FREESTYLE LITE test strip USE AS INSTRUCTED ONCE DAILY 100 each 11  . furosemide (LASIX) 20 MG tablet TAKE 1 TABLET BY MOUTH DAILY. 90 tablet 1  . Lancets (FREESTYLE) lancets     . lidocaine (LIDODERM) 5 % Place 1 patch onto the skin daily. Remove & Discard patch within 12 hours or as directed by MD    . LIDODERM 5 % PLACE 1 PATCH ONTO THE SKIN DAILY. REMOVE & DISCARD PATCH WITHIN 12 HOURS OR AS DIRECTED BY MD 30 patch 11  . losartan (COZAAR) 25 MG tablet Take 1 tablet (25 mg total) by mouth daily. 90 tablet 3  . meclizine (ANTIVERT) 12.5 MG tablet Take 1 tablet (12.5 mg total) by mouth 3 (three) times daily as needed for dizziness. 30 tablet 3  . Melatonin 10 MG TABS Take 1 tablet by mouth as needed.    . Multiple Vitamin (MULTIVITAMIN) tablet Take 1 tablet by mouth daily.      Marland Kitchen omeprazole (PRILOSEC) 20 MG capsule TAKE 2 CAPSULES (40 MG TOTAL) BY MOUTH DAILY. 180 capsule 11  . simvastatin (ZOCOR) 40 MG tablet TAKE 1 TABLET BY MOUTH AT  BEDTIME. 90 tablet 1  . TESTIM 50 MG/5GM (1%) GEL APPLY THE CONTENTS OF 2 TUBES ONTO THE SKIN DAILY. 300 g 5   No current facility-administered medications on file prior to visit.    ALLERGIES: Allergies  Allergen Reactions  . Propoxyphene N-Acetaminophen     FAMILY HISTORY: Family History  Problem Relation Age of Onset  . Heart disease Mother   . Stroke Mother   . Cancer Father     Lymphoma  . Cancer Sister     Uncertain  . Colon cancer Neg Hx     SOCIAL HISTORY: Social History   Social History  . Marital Status: Married    Spouse Name: N/A  . Number of Children: N/A  . Years of Education: N/A   Occupational History  . Not on file.   Social History Main Topics  . Smoking status: Former Smoker    Quit date: 05/25/1997  . Smokeless  tobacco: Never Used  . Alcohol Use: 3.6 oz/week    6 Glasses of wine per week  . Drug Use: No  . Sexual Activity: Not on file   Other Topics Concern  . Not on file   Social History Narrative    REVIEW OF SYSTEMS: Constitutional: No fevers, chills, or sweats, no generalized fatigue, change in appetite Eyes: No visual changes, double vision, eye pain Ear, nose and throat: No hearing loss, ear pain, nasal congestion, sore throat Cardiovascular: No chest pain, palpitations Respiratory:  No shortness of breath at rest or with exertion, wheezes GastrointestinaI: No nausea, vomiting, diarrhea, abdominal pain, fecal incontinence Genitourinary:  No dysuria, urinary retention or frequency Musculoskeletal:  No neck pain, back pain Integumentary: No rash, pruritus, skin lesions Neurological: as above Psychiatric: No depression, insomnia, anxiety Endocrine: No palpitations, fatigue, diaphoresis, mood swings, change in appetite, change in weight, increased thirst Hematologic/Lymphatic:  No anemia, purpura, petechiae. Allergic/Immunologic: no itchy/runny eyes, nasal congestion, recent allergic reactions, rashes  PHYSICAL EXAM: Filed Vitals:   06/25/15 1420  BP: 134/88  Pulse: 73   General: No acute distress.  Patient appears well-groomed.  Head:  Normocephalic/atraumatic Eyes:  fundi unremarkable, without vessel changes, exudates, hemorrhages or papilledema. Neck: supple, no paraspinal tenderness, full range of motion Back: No paraspinal tenderness Heart: regular rate and rhythm Lungs: Clear to auscultation bilaterally. Vascular: No carotid bruits. Neurological Exam: Mental status: alert and oriented to person, place, and time, recent and remote memory intact, fund of knowledge intact, attention and concentration intact, speech fluent and not dysarthric, language intact. Cranial nerves: CN I: not tested CN II: pupils equal, round and reactive to light, visual fields intact, fundi  unremarkable, without vessel changes, exudates, hemorrhages or papilledema. CN III, IV, VI:  full range of motion, no nystagmus, no ptosis CN V: facial sensation intact CN VII: upper and lower face symmetric CN VIII: hearing intact CN IX, X: gag intact, uvula midline CN XI: sternocleidomastoid and trapezius muscles intact CN XII: tongue midline Bulk & Tone: normal, no fasciculations. Motor:  5/5 throughout  Sensation:  Decreased temperature and vibration sensation in feet. Deep Tendon Reflexes:  2+ in upper extremities, absent in lower extremities, toes downgoing. Finger to nose testing:  Without dysmetria.  Heel to shin:  Without dysmetria.  Gait:  Wide based gait.  Able to turn, difficulty with tandem. Romberg negative.  IMPRESSION: Vertigo.  May be inner ear.Marland Kitchen  MRI was negative.  He exhibited no other neurologic signs or symptoms.  PLAN: I don't think further workup is warranted.  He is already on ASA and statin, which is appropriate.  Continue current management.  Follow up as needed.  Thank you for allowing me to take part in the care of this patient.  Metta Clines, DO  CC:  Renato Shin, MD

## 2015-07-02 ENCOUNTER — Other Ambulatory Visit: Payer: Self-pay

## 2015-07-02 MED ORDER — LIDODERM 5 % EX PTCH: MEDICATED_PATCH | CUTANEOUS | Status: DC

## 2015-07-09 ENCOUNTER — Ambulatory Visit (INDEPENDENT_AMBULATORY_CARE_PROVIDER_SITE_OTHER): Payer: Medicare HMO | Admitting: Podiatry

## 2015-07-09 ENCOUNTER — Encounter: Payer: Self-pay | Admitting: Podiatry

## 2015-07-09 DIAGNOSIS — M722 Plantar fascial fibromatosis: Secondary | ICD-10-CM | POA: Diagnosis not present

## 2015-07-09 DIAGNOSIS — E1140 Type 2 diabetes mellitus with diabetic neuropathy, unspecified: Secondary | ICD-10-CM | POA: Diagnosis not present

## 2015-07-09 DIAGNOSIS — B351 Tinea unguium: Secondary | ICD-10-CM | POA: Diagnosis not present

## 2015-07-09 DIAGNOSIS — M79676 Pain in unspecified toe(s): Secondary | ICD-10-CM | POA: Diagnosis not present

## 2015-07-09 NOTE — Progress Notes (Signed)
Patient ID: Gerald Martinez, male   DOB: July 23, 1933, 82 y.o.   MRN: [id number] Complaint:  Visit Type: Patient returns to my office for continued preventative foot care services. Complaint: Patient states" my nails have grown long and thick and become painful to walk and wear shoes" Patient has been diagnosed with DM with no foot complications. The patient presents for preventative foot care services. No changes to ROS.  He requests diabetic insoles at this visit. He says he cannot walk without his custom made diabetic insoles due to plantar fascitis.  Podiatric Exam: Vascular: dorsalis pedis and posterior tibial pulses are palpable bilateral. Capillary return is immediate. Temperature gradient is WNL. Skin turgor WNL  Sensorium: Diminished l Semmes Weinstein monofilament test. Normal tactile sensation bilaterally. Nail Exam: Pt has thick disfigured discolored nails with subungual debris noted bilateral entire nail hallux through fifth toenails.  His 1 hallux left foot has two separate spicules.  and  2, 3 toenails right foot have separate nail spicules.   His left hallux nail is bleeding.  Recommended nail surgery for its permanent removal in future. Ulcer Exam: There is no evidence of ulcer or pre-ulcerative changes or infection. Orthopedic Exam: Muscle tone and strength are WNL. No limitations in general ROM. No crepitus or effusions noted. Foot type and digits show no abnormalities. Bony prominences are unremarkable. Skin: No Porokeratosis. No infection or ulcers  Diagnosis:  Onychomycosis, , Pain in right toe, pain in left toes, Plantar fascitis.  Diabetic neuropathy.  Treatment & Plan Procedures and Treatment: Consent by patient was obtained for treatment procedures. The patient understood the discussion of treatment and procedures well. All questions were answered thoroughly reviewed. Debridement of mycotic and hypertrophic toenails, 1 through 5 bilateral and clearing of subungual debris. No  ulceration, no infection noted. Bleeding noted left hallux and bandaged with neosporin/ DSD.  I am concerned about his left hallux mycotic nail which bleeds each time I have debrided the nail.  Contacted [facility] for her to obtain another pair of diabetic custom made insoles. I talked to this patient about nail surgery for permanment removal left hallux toenail.  Asked him to check with his medical doctor. Return Visit-Office Procedure: Patient instructed to return to the office for a follow up visit 3 months for continued evaluation and treatment.  Gardiner Barefoot DPM

## 2015-07-11 ENCOUNTER — Encounter: Payer: Self-pay | Admitting: Endocrinology

## 2015-07-11 DIAGNOSIS — E119 Type 2 diabetes mellitus without complications: Secondary | ICD-10-CM | POA: Diagnosis not present

## 2015-07-11 DIAGNOSIS — H35373 Puckering of macula, bilateral: Secondary | ICD-10-CM | POA: Diagnosis not present

## 2015-07-11 DIAGNOSIS — H35342 Macular cyst, hole, or pseudohole, left eye: Secondary | ICD-10-CM | POA: Diagnosis not present

## 2015-07-30 ENCOUNTER — Ambulatory Visit: Payer: Medicare HMO | Admitting: Endocrinology

## 2015-07-31 ENCOUNTER — Ambulatory Visit: Payer: Medicare HMO | Admitting: *Deleted

## 2015-07-31 DIAGNOSIS — M722 Plantar fascial fibromatosis: Secondary | ICD-10-CM

## 2015-07-31 DIAGNOSIS — E1140 Type 2 diabetes mellitus with diabetic neuropathy, unspecified: Secondary | ICD-10-CM

## 2015-07-31 NOTE — Patient Instructions (Signed)

## 2015-07-31 NOTE — Progress Notes (Signed)
Patient ID: Gerald Martinez, male   DOB: [birth year], 82 y.o.   MRN: [id number] Patient presents for orthotic pick up.  Verbal and written break in and wear instructions given.  Patient will follow up in 4 weeks if symptoms worsen or fail to improve.

## 2015-08-16 ENCOUNTER — Telehealth: Payer: Self-pay | Admitting: Endocrinology

## 2015-08-16 NOTE — Telephone Encounter (Signed)
please call patient: Pharmacy is offering to change lidoderm patch to lidocaine ointment If the patches are expensive, i would be happy to change, to see if the ointment is cheaper

## 2015-08-19 ENCOUNTER — Other Ambulatory Visit: Payer: Self-pay | Admitting: Endocrinology

## 2015-08-19 NOTE — Telephone Encounter (Signed)
Left a vm advising of the note below. Requested a call back from the pt.

## 2015-08-20 ENCOUNTER — Ambulatory Visit: Payer: Medicare HMO | Admitting: Podiatry

## 2015-08-22 ENCOUNTER — Other Ambulatory Visit: Payer: Self-pay | Admitting: Endocrinology

## 2015-09-09 ENCOUNTER — Ambulatory Visit: Payer: Medicare HMO | Admitting: Endocrinology

## 2015-09-27 DIAGNOSIS — Z0100 Encounter for examination of eyes and vision without abnormal findings: Secondary | ICD-10-CM | POA: Diagnosis not present

## 2015-09-27 DIAGNOSIS — H25813 Combined forms of age-related cataract, bilateral: Secondary | ICD-10-CM | POA: Diagnosis not present

## 2015-09-27 DIAGNOSIS — E119 Type 2 diabetes mellitus without complications: Secondary | ICD-10-CM | POA: Diagnosis not present

## 2015-09-27 DIAGNOSIS — H3342 Traction detachment of retina, left eye: Secondary | ICD-10-CM | POA: Diagnosis not present

## 2015-09-27 LAB — HM DIABETES EYE EXAM

## 2015-10-08 ENCOUNTER — Encounter: Payer: Self-pay | Admitting: Podiatry

## 2015-10-08 ENCOUNTER — Ambulatory Visit (INDEPENDENT_AMBULATORY_CARE_PROVIDER_SITE_OTHER): Payer: Medicare HMO | Admitting: Podiatry

## 2015-10-08 DIAGNOSIS — B351 Tinea unguium: Secondary | ICD-10-CM

## 2015-10-08 DIAGNOSIS — M79676 Pain in unspecified toe(s): Secondary | ICD-10-CM

## 2015-10-08 DIAGNOSIS — E1140 Type 2 diabetes mellitus with diabetic neuropathy, unspecified: Secondary | ICD-10-CM | POA: Diagnosis not present

## 2015-10-08 NOTE — Progress Notes (Signed)
Patient ID: [name], male   DOB: 04-26-1934, 82 y.o.   MRN: [id number] Complaint:  Visit Type: Patient returns to my office for continued preventative foot care services. Complaint: Patient states" my nails have grown long and thick and become painful to walk and wear shoes" Patient has been diagnosed with DM with no foot complications. The patient presents for preventative foot care services. No changes to ROS.  He requests diabetic insoles at this visit.   Podiatric Exam: Vascular: dorsalis pedis and posterior tibial pulses are palpable bilateral. Capillary return is immediate. Temperature gradient is WNL. Skin turgor WNL  Sensorium: Diminished l Semmes Weinstein monofilament test. Normal tactile sensation bilaterally. Nail Exam: Pt has thick disfigured discolored nails with subungual debris noted bilateral entire nail hallux through fifth toenails.  His 1 hallux left foot has two separate spicules.  and  2, 3 toenails right foot have separate nail spicules.   His left hallux nail is bleeding.   Ulcer Exam: There is no evidence of ulcer or pre-ulcerative changes or infection. Orthopedic Exam: Muscle tone and strength are WNL. No limitations in general ROM. No crepitus or effusions noted. Foot type and digits show no abnormalities. Bony prominences are unremarkable. Skin: No Porokeratosis. No infection or ulcers  Diagnosis:  Onychomycosis, , Pain in right toe, pain in left toes,   Diabetic neuropathy.  Treatment & Plan Procedures and Treatment: Consent by patient was obtained for treatment procedures. The patient understood the discussion of treatment and procedures well. All questions were answered thoroughly reviewed. Debridement of mycotic and hypertrophic toenails, 1 through 5 bilateral and clearing of subungual debris. No ulceration, no infection noted. Bleeding noted left hallux and bandaged with neosporin/ DSD.  IAsked him to check with his medical doctor. Return Visit-Office Procedure:  Patient instructed to return to the office for a follow up visit 3 months for continued evaluation and treatment.  Gardiner Barefoot DPM

## 2015-10-09 ENCOUNTER — Encounter: Payer: Self-pay | Admitting: Endocrinology

## 2015-10-09 ENCOUNTER — Ambulatory Visit (INDEPENDENT_AMBULATORY_CARE_PROVIDER_SITE_OTHER): Payer: Medicare HMO | Admitting: Endocrinology

## 2015-10-09 VITALS — BP 122/60 | HR 71 | Temp 98.0°F | Wt 214.0 lb

## 2015-10-09 DIAGNOSIS — E1159 Type 2 diabetes mellitus with other circulatory complications: Secondary | ICD-10-CM

## 2015-10-09 DIAGNOSIS — E299 Testicular dysfunction, unspecified: Secondary | ICD-10-CM

## 2015-10-09 LAB — POCT GLYCOSYLATED HEMOGLOBIN (HGB A1C): Hemoglobin A1C: 6.3

## 2015-10-09 NOTE — Patient Instructions (Addendum)
Please continue the same medications.  blood tests are requested for you today.  We'll let you know about the results.    Please come back for a regular physical appointment in 4-6 months.

## 2015-10-09 NOTE — Progress Notes (Signed)
Subjective:    Patient ID: Gerald Martinez, male    DOB: 1934/03/07, 82 y.o.   MRN: [id number]  HPI Pt returns for f/u of diabetes mellitus: DM type: 2 Dx'ed: [id number] Complications: none Therapy: no medication DKA: never Severe hypoglycemia: never Pancreatitis: never Other: he has never been on insulin Interval history: pt states he feels well in general, except for fatigue. Past Medical History  Diagnosis Date  . Diabetes mellitus     Type II  . Hypertension   . BPH (benign prostatic hypertrophy)   . Dyslipidemia   . AAA (abdominal aortic aneurysm) ([place])   . Hearing loss   . Esophageal cancer ([place])   . CAD (coronary artery disease)     Post CABG in 2000.  LIMA to the LAD, SVG to OM, SVG to PDA, last perfusion study in 2012 with no high risk findings    Past Surgical History  Procedure Laterality Date  . Coronary artery bypass graft  2000  . Esophagectomy  2000  . Ventral hernia repair  2001    Social History   Social History  . Marital Status: Married    Spouse Name: N/A  . Number of Children: N/A  . Years of Education: N/A   Occupational History  . Not on file.   Social History Main Topics  . Smoking status: Former Smoker    Quit date: 05/25/1997  . Smokeless tobacco: Never Used  . Alcohol Use: 3.6 oz/week    6 Glasses of wine per week  . Drug Use: No  . Sexual Activity: Not on file   Other Topics Concern  . Not on file   Social History Narrative    Current Outpatient Prescriptions on File Prior to Visit  Medication Sig Dispense Refill  . aspirin 325 MG EC tablet Take 325 mg by mouth daily.      Marland Kitchen doxazosin (CARDURA) 4 MG tablet TAKE 1 TABLET BY MOUTH DAILY. 90 tablet 1  . FREESTYLE LITE test strip USE AS INSTRUCTED ONCE DAILY 100 each 11  . furosemide (LASIX) 20 MG tablet TAKE 1 TABLET BY MOUTH DAILY. 90 tablet 1  . Lancets (FREESTYLE) lancets     . LIDODERM 5 % PLACE 1 PATCH ONTO THE SKIN DAILY. REMOVE & DISCARD PATCH WITHIN 12 HOURS OR AS  DIRECTED BY MD 30 patch 11  . losartan (COZAAR) 25 MG tablet Take 1 tablet (25 mg total) by mouth daily. 90 tablet 3  . meclizine (ANTIVERT) 12.5 MG tablet Take 1 tablet (12.5 mg total) by mouth 3 (three) times daily as needed for dizziness. 30 tablet 3  . Melatonin 10 MG TABS Take 1 tablet by mouth as needed.    . Multiple Vitamin (MULTIVITAMIN) tablet Take 1 tablet by mouth daily.      Marland Kitchen omeprazole (PRILOSEC) 20 MG capsule TAKE 2 CAPSULES (40 MG TOTAL) BY MOUTH DAILY. 180 capsule 11  . simvastatin (ZOCOR) 40 MG tablet TAKE 1 TABLET BY MOUTH AT BEDTIME. 90 tablet 1  . TESTIM 50 MG/5GM (1%) GEL APPLY THE CONTENTS OF 2 TUBES ONTO THE SKIN DAILY. 300 g 5   No current facility-administered medications on file prior to visit.    Allergies  Allergen Reactions  . Propoxyphene N-Acetaminophen     Family History  Problem Relation Age of Onset  . Heart disease Mother   . Stroke Mother   . Cancer Father     Lymphoma  . Cancer Sister     Uncertain  .  Colon cancer Neg Hx     BP 122/60 mmHg  Pulse 71  Temp(Src) 98 F (36.7 C) (Oral)  Wt 214 lb (97.07 kg)  SpO2 95%  Review of Systems No weight change    Objective:   Physical Exam VITAL SIGNS:  See vs page.  GENERAL: no distress. SKIN: no ulcer on the feet. Old healed surgical scars (bilat vein harvest). EXTEMITIES: no deformity. feet are of normal temp, but cyanotic while dependent. 1+ bilat leg edema. There is bilateral onychomycosis.   PULSES: dorsalis pedis intact bilat.  NEURO: sensation is intact to touch on the feet, but decreased from normal.   A1c=6.3% Lab Results  Component Value Date   TESTOSTERONE 595 10/09/2015      Assessment & Plan:  DM: no medication is needed now.   Hypogonadism: well-replaced.    Patient is advised the following: Patient Instructions  Please continue the same medications.  blood tests are requested for you today.  We'll let you know about the results.    Please come back for a  regular physical appointment in 4-6 months.

## 2015-10-10 LAB — TESTOSTERONE,FREE AND TOTAL
TESTOSTERONE: 595 ng/dL (ref 348–1197)
Testosterone, Free: 11.3 pg/mL (ref 6.6–18.1)

## 2015-10-22 DIAGNOSIS — H25812 Combined forms of age-related cataract, left eye: Secondary | ICD-10-CM | POA: Diagnosis not present

## 2015-10-22 DIAGNOSIS — H25042 Posterior subcapsular polar age-related cataract, left eye: Secondary | ICD-10-CM | POA: Diagnosis not present

## 2015-10-22 DIAGNOSIS — H25032 Anterior subcapsular polar age-related cataract, left eye: Secondary | ICD-10-CM | POA: Diagnosis not present

## 2015-10-22 DIAGNOSIS — H52202 Unspecified astigmatism, left eye: Secondary | ICD-10-CM | POA: Diagnosis not present

## 2015-10-22 DIAGNOSIS — H2512 Age-related nuclear cataract, left eye: Secondary | ICD-10-CM | POA: Diagnosis not present

## 2015-11-05 DIAGNOSIS — H25041 Posterior subcapsular polar age-related cataract, right eye: Secondary | ICD-10-CM | POA: Diagnosis not present

## 2015-11-05 DIAGNOSIS — H52201 Unspecified astigmatism, right eye: Secondary | ICD-10-CM | POA: Diagnosis not present

## 2015-11-05 DIAGNOSIS — H25031 Anterior subcapsular polar age-related cataract, right eye: Secondary | ICD-10-CM | POA: Diagnosis not present

## 2015-11-05 DIAGNOSIS — H2511 Age-related nuclear cataract, right eye: Secondary | ICD-10-CM | POA: Diagnosis not present

## 2015-11-05 DIAGNOSIS — H25011 Cortical age-related cataract, right eye: Secondary | ICD-10-CM | POA: Diagnosis not present

## 2015-11-05 DIAGNOSIS — H25811 Combined forms of age-related cataract, right eye: Secondary | ICD-10-CM | POA: Diagnosis not present

## 2015-11-21 DIAGNOSIS — Z961 Presence of intraocular lens: Secondary | ICD-10-CM | POA: Diagnosis not present

## 2015-12-10 ENCOUNTER — Other Ambulatory Visit: Payer: Self-pay | Admitting: Endocrinology

## 2015-12-26 DIAGNOSIS — H43822 Vitreomacular adhesion, left eye: Secondary | ICD-10-CM | POA: Diagnosis not present

## 2015-12-28 ENCOUNTER — Other Ambulatory Visit: Payer: Self-pay | Admitting: Endocrinology

## 2016-01-02 ENCOUNTER — Encounter: Payer: Self-pay | Admitting: Cardiology

## 2016-01-07 ENCOUNTER — Encounter: Payer: Self-pay | Admitting: Podiatry

## 2016-01-07 ENCOUNTER — Ambulatory Visit (INDEPENDENT_AMBULATORY_CARE_PROVIDER_SITE_OTHER): Payer: Medicare HMO | Admitting: Podiatry

## 2016-01-07 DIAGNOSIS — E1140 Type 2 diabetes mellitus with diabetic neuropathy, unspecified: Secondary | ICD-10-CM | POA: Diagnosis not present

## 2016-01-07 DIAGNOSIS — B351 Tinea unguium: Secondary | ICD-10-CM | POA: Diagnosis not present

## 2016-01-07 DIAGNOSIS — M79676 Pain in unspecified toe(s): Secondary | ICD-10-CM

## 2016-01-07 NOTE — Progress Notes (Signed)
Patient ID: Gerald Martinez, male   DOB: [birth year], 82 y.o.   MRN: [id number] Complaint:  Visit Type: Patient returns to my office for continued preventative foot care services. Complaint: Patient states" my nails have grown long and thick and become painful to walk and wear shoes" Patient has been diagnosed with DM with no foot complications. The patient presents for preventative foot care services. No changes to ROS.  He requests diabetic insoles at this visit.   Podiatric Exam: Vascular: dorsalis pedis and posterior tibial pulses are palpable bilateral. Capillary return is immediate. Temperature gradient is WNL. Skin turgor WNL  Sensorium: Diminished l Semmes Weinstein monofilament test. Normal tactile sensation bilaterally. Nail Exam: Pt has thick disfigured discolored nails with subungual debris noted bilateral entire nail hallux through fifth toenails.  His 1 hallux left foot has two separate spicules.  and  2, 3 toenails right foot have separate nail spicules.   His left hallux nail is bleeding.   Ulcer Exam: There is no evidence of ulcer or pre-ulcerative changes or infection. Orthopedic Exam: Muscle tone and strength are WNL. No limitations in general ROM. No crepitus or effusions noted. Foot type and digits show no abnormalities. Bony prominences are unremarkable. Skin: No Porokeratosis. No infection or ulcers  Diagnosis:  Onychomycosis, , Pain in right toe, pain in left toes,   Diabetic neuropathy.  Treatment & Plan Procedures and Treatment: Consent by patient was obtained for treatment procedures. The patient understood the discussion of treatment and procedures well. All questions were answered thoroughly reviewed. Debridement of mycotic and hypertrophic toenails, 1 through 5 bilateral and clearing of subungual debris. No ulceration, no infection noted. Bleeding noted left hallux and bandaged with neosporin/ DSD.  IAsked him to check with his medical doctor. Return Visit-Office Procedure:  Patient instructed to return to the office for a follow up visit 3 months for continued evaluation and treatment.  Gardiner Barefoot DPM

## 2016-01-16 ENCOUNTER — Ambulatory Visit: Payer: Medicare HMO | Admitting: Cardiology

## 2016-01-20 ENCOUNTER — Other Ambulatory Visit: Payer: Self-pay | Admitting: Endocrinology

## 2016-01-22 DIAGNOSIS — H35342 Macular cyst, hole, or pseudohole, left eye: Secondary | ICD-10-CM | POA: Diagnosis not present

## 2016-01-22 DIAGNOSIS — Z9842 Cataract extraction status, left eye: Secondary | ICD-10-CM | POA: Diagnosis not present

## 2016-01-22 DIAGNOSIS — I10 Essential (primary) hypertension: Secondary | ICD-10-CM | POA: Diagnosis not present

## 2016-01-22 DIAGNOSIS — H4322 Crystalline deposits in vitreous body, left eye: Secondary | ICD-10-CM | POA: Diagnosis not present

## 2016-01-22 DIAGNOSIS — H02831 Dermatochalasis of right upper eyelid: Secondary | ICD-10-CM | POA: Diagnosis not present

## 2016-01-22 DIAGNOSIS — H02834 Dermatochalasis of left upper eyelid: Secondary | ICD-10-CM | POA: Diagnosis not present

## 2016-01-22 DIAGNOSIS — Z961 Presence of intraocular lens: Secondary | ICD-10-CM | POA: Diagnosis not present

## 2016-01-22 DIAGNOSIS — E119 Type 2 diabetes mellitus without complications: Secondary | ICD-10-CM | POA: Diagnosis not present

## 2016-01-22 DIAGNOSIS — H43822 Vitreomacular adhesion, left eye: Secondary | ICD-10-CM | POA: Diagnosis not present

## 2016-01-22 DIAGNOSIS — Z9841 Cataract extraction status, right eye: Secondary | ICD-10-CM | POA: Diagnosis not present

## 2016-01-22 DIAGNOSIS — H26492 Other secondary cataract, left eye: Secondary | ICD-10-CM | POA: Diagnosis not present

## 2016-01-29 DIAGNOSIS — I2510 Atherosclerotic heart disease of native coronary artery without angina pectoris: Secondary | ICD-10-CM | POA: Diagnosis not present

## 2016-01-29 DIAGNOSIS — H43822 Vitreomacular adhesion, left eye: Secondary | ICD-10-CM | POA: Diagnosis not present

## 2016-01-29 DIAGNOSIS — K219 Gastro-esophageal reflux disease without esophagitis: Secondary | ICD-10-CM | POA: Diagnosis not present

## 2016-01-29 DIAGNOSIS — E7800 Pure hypercholesterolemia, unspecified: Secondary | ICD-10-CM | POA: Diagnosis not present

## 2016-01-29 DIAGNOSIS — H35372 Puckering of macula, left eye: Secondary | ICD-10-CM | POA: Diagnosis not present

## 2016-01-29 DIAGNOSIS — M1990 Unspecified osteoarthritis, unspecified site: Secondary | ICD-10-CM | POA: Diagnosis not present

## 2016-01-29 DIAGNOSIS — H35342 Macular cyst, hole, or pseudohole, left eye: Secondary | ICD-10-CM | POA: Diagnosis not present

## 2016-01-29 DIAGNOSIS — I10 Essential (primary) hypertension: Secondary | ICD-10-CM | POA: Diagnosis not present

## 2016-01-29 DIAGNOSIS — G4733 Obstructive sleep apnea (adult) (pediatric): Secondary | ICD-10-CM | POA: Diagnosis not present

## 2016-01-29 DIAGNOSIS — E119 Type 2 diabetes mellitus without complications: Secondary | ICD-10-CM | POA: Diagnosis not present

## 2016-01-31 ENCOUNTER — Ambulatory Visit (INDEPENDENT_AMBULATORY_CARE_PROVIDER_SITE_OTHER): Payer: Medicare HMO | Admitting: Endocrinology

## 2016-01-31 ENCOUNTER — Encounter: Payer: Self-pay | Admitting: Endocrinology

## 2016-01-31 VITALS — BP 122/80 | HR 72 | Ht 72.0 in | Wt 214.0 lb

## 2016-01-31 DIAGNOSIS — D509 Iron deficiency anemia, unspecified: Secondary | ICD-10-CM

## 2016-01-31 DIAGNOSIS — E1159 Type 2 diabetes mellitus with other circulatory complications: Secondary | ICD-10-CM

## 2016-01-31 DIAGNOSIS — Z0000 Encounter for general adult medical examination without abnormal findings: Secondary | ICD-10-CM

## 2016-01-31 DIAGNOSIS — Z125 Encounter for screening for malignant neoplasm of prostate: Secondary | ICD-10-CM | POA: Diagnosis not present

## 2016-01-31 DIAGNOSIS — I10 Essential (primary) hypertension: Secondary | ICD-10-CM

## 2016-01-31 DIAGNOSIS — E871 Hypo-osmolality and hyponatremia: Secondary | ICD-10-CM

## 2016-01-31 DIAGNOSIS — E299 Testicular dysfunction, unspecified: Secondary | ICD-10-CM | POA: Diagnosis not present

## 2016-01-31 DIAGNOSIS — Z23 Encounter for immunization: Secondary | ICD-10-CM | POA: Diagnosis not present

## 2016-01-31 LAB — URINALYSIS, ROUTINE W REFLEX MICROSCOPIC
BILIRUBIN URINE: NEGATIVE
Hgb urine dipstick: NEGATIVE
KETONES UR: NEGATIVE
NITRITE: NEGATIVE
RBC / HPF: NONE SEEN (ref 0–?)
SPECIFIC GRAVITY, URINE: 1.01 (ref 1.000–1.030)
Total Protein, Urine: NEGATIVE
Urine Glucose: NEGATIVE
Urobilinogen, UA: 0.2 (ref 0.0–1.0)
pH: 7.5 (ref 5.0–8.0)

## 2016-01-31 LAB — CBC WITH DIFFERENTIAL/PLATELET
Basophils Absolute: 0 10*3/uL (ref 0.0–0.1)
Basophils Relative: 0.7 % (ref 0.0–3.0)
EOS PCT: 2.7 % (ref 0.0–5.0)
Eosinophils Absolute: 0.1 10*3/uL (ref 0.0–0.7)
HEMATOCRIT: 43.1 % (ref 39.0–52.0)
HEMOGLOBIN: 14.1 g/dL (ref 13.0–17.0)
LYMPHS PCT: 30.4 % (ref 12.0–46.0)
Lymphs Abs: 1.6 10*3/uL (ref 0.7–4.0)
MCHC: 32.8 g/dL (ref 30.0–36.0)
MCV: 80.1 fl (ref 78.0–100.0)
MONO ABS: 0.7 10*3/uL (ref 0.1–1.0)
Monocytes Relative: 12.8 % — ABNORMAL HIGH (ref 3.0–12.0)
Neutro Abs: 2.7 10*3/uL (ref 1.4–7.7)
Neutrophils Relative %: 53.4 % (ref 43.0–77.0)
Platelets: 185 10*3/uL (ref 150.0–400.0)
RBC: 5.39 Mil/uL (ref 4.22–5.81)
RDW: 17.7 % — ABNORMAL HIGH (ref 11.5–15.5)
WBC: 5.1 10*3/uL (ref 4.0–10.5)

## 2016-01-31 LAB — LIPID PANEL
CHOLESTEROL: 136 mg/dL (ref 0–200)
HDL: 44 mg/dL (ref >39.00)
LDL CALC: 67 mg/dL (ref 0–99)
NonHDL: 92.43
TRIGLYCERIDES: 129 mg/dL (ref 0.0–149.0)
Total CHOL/HDL Ratio: 3
VLDL: 25.8 mg/dL (ref 0.0–40.0)

## 2016-01-31 LAB — POCT GLYCOSYLATED HEMOGLOBIN (HGB A1C): Hemoglobin A1C: 6.3

## 2016-01-31 LAB — HEPATIC FUNCTION PANEL
ALBUMIN: 4.1 g/dL (ref 3.5–5.2)
ALK PHOS: 55 U/L (ref 39–117)
ALT: 25 U/L (ref 0–53)
AST: 29 U/L (ref 0–37)
Bilirubin, Direct: 0.1 mg/dL (ref 0.0–0.3)
TOTAL PROTEIN: 6.7 g/dL (ref 6.0–8.3)
Total Bilirubin: 0.6 mg/dL (ref 0.2–1.2)

## 2016-01-31 LAB — BASIC METABOLIC PANEL
BUN: 12 mg/dL (ref 6–23)
CHLORIDE: 98 meq/L (ref 96–112)
CO2: 34 mEq/L — ABNORMAL HIGH (ref 19–32)
Calcium: 8.8 mg/dL (ref 8.4–10.5)
Creatinine, Ser: 0.89 mg/dL (ref 0.40–1.50)
GFR: 87.45 mL/min (ref >60.00)
Glucose, Bld: 92 mg/dL (ref 70–99)
POTASSIUM: 4.6 meq/L (ref 3.5–5.1)
Sodium: 134 mEq/L — ABNORMAL LOW (ref 135–145)

## 2016-01-31 LAB — MICROALBUMIN / CREATININE URINE RATIO
CREATININE, U: 51.9 mg/dL
MICROALB/CREAT RATIO: 2.1 mg/g (ref 0.0–30.0)
Microalb, Ur: 1.1 mg/dL (ref 0.0–1.9)

## 2016-01-31 LAB — PSA: PSA: 3.17 ng/mL (ref 0.10–4.00)

## 2016-01-31 LAB — IBC PANEL
IRON: 46 ug/dL (ref 42–165)
SATURATION RATIOS: 10.7 % — AB (ref 20.0–50.0)
Transferrin: 306 mg/dL (ref 212.0–360.0)

## 2016-01-31 LAB — TSH: TSH: 3.04 u[IU]/mL (ref 0.35–4.50)

## 2016-01-31 MED ORDER — TRAZODONE HCL 100 MG PO TABS: 100.0000 mg | ORAL_TABLET | Freq: Every evening | ORAL | 3 refills | Status: DC | PRN

## 2016-01-31 NOTE — Progress Notes (Signed)
Subjective:    Patient ID: Gerald Martinez, male    DOB: Apr 02, 1934, 82 y.o.   MRN: [id number]  HPI Pt is here for regular wellness examination, and is feeling pretty well in general, and says chronic med probs are stable, except as noted below Past Medical History:  Diagnosis Date  . AAA (abdominal aortic aneurysm) ([place])   . BPH (benign prostatic hypertrophy)   . CAD (coronary artery disease)    Post CABG in 2000.  LIMA to the LAD, SVG to OM, SVG to PDA, last perfusion study in 2012 with no high risk findings  . Diabetes mellitus    Type II  . Dyslipidemia   . Esophageal cancer ([place])   . Hearing loss   . Hypertension     Past Surgical History:  Procedure Laterality Date  . CORONARY ARTERY BYPASS GRAFT  2000  . ESOPHAGECTOMY  2000  . VENTRAL HERNIA REPAIR  2001    Social History   Social History  . Marital status: Married    Spouse name: N/A  . Number of children: N/A  . Years of education: N/A   Occupational History  . Not on file.   Social History Main Topics  . Smoking status: Former Smoker    Quit date: 05/25/1997  . Smokeless tobacco: Never Used  . Alcohol use 3.6 oz/week    6 Glasses of wine per week  . Drug use: No  . Sexual activity: Not on file   Other Topics Concern  . Not on file   Social History Narrative  . No narrative on file    Current Outpatient Prescriptions on File Prior to Visit  Medication Sig Dispense Refill  . aspirin 325 MG EC tablet Take 325 mg by mouth daily.      Marland Kitchen doxazosin (CARDURA) 4 MG tablet TAKE 1 TABLET BY MOUTH DAILY. 90 tablet 2  . FREESTYLE LITE test strip USE AS INSTRUCTED ONCE DAILY 100 each 11  . furosemide (LASIX) 20 MG tablet TAKE 1 TABLET BY MOUTH DAILY. 90 tablet 0  . Lancets (FREESTYLE) lancets     . LIDODERM 5 % PLACE 1 PATCH ONTO THE SKIN DAILY. REMOVE & DISCARD PATCH WITHIN 12 HOURS OR AS DIRECTED BY MD 30 patch 11  . losartan (COZAAR) 25 MG tablet Take 1 tablet (25 mg total) by mouth daily. 90 tablet 3  .  Melatonin 10 MG TABS Take 1 tablet by mouth as needed.    . metFORMIN (GLUCOPHAGE-XR) 500 MG 24 hr tablet TAKE 1 TABLET BY MOUTH DAILY WITH BREAKFAST. 90 tablet 3  . Multiple Vitamin (MULTIVITAMIN) tablet Take 1 tablet by mouth daily.      Marland Kitchen omeprazole (PRILOSEC) 20 MG capsule TAKE 2 CAPSULES (40 MG TOTAL) BY MOUTH DAILY. 180 capsule 11  . simvastatin (ZOCOR) 40 MG tablet TAKE 1 TABLET BY MOUTH AT BEDTIME. 90 tablet 2  . TESTIM 50 MG/5GM (1%) GEL APPLY THE CONTENTS OF 2 TUBES ONTO THE SKIN DAILY. 300 g 5   No current facility-administered medications on file prior to visit.     Allergies  Allergen Reactions  . Propoxyphene N-Acetaminophen     Family History  Problem Relation Age of Onset  . Heart disease Mother   . Stroke Mother   . Cancer Father     Lymphoma  . Cancer Sister     Uncertain  . Colon cancer Neg Hx     BP 122/80   Pulse 72   Ht 6' (  1.829 m)   Wt 214 lb (97.1 kg)   SpO2 97%   BMI 29.02 kg/m   Review of Systems  Constitutional: Negative for fever.  HENT: Negative for tinnitus.   Eyes: Negative for photophobia.  Respiratory: Negative for shortness of breath.   Cardiovascular: Negative for chest pain.  Gastrointestinal: Negative for anal bleeding.  Endocrine: Negative for cold intolerance.  Genitourinary: Negative for difficulty urinating and hematuria.  Musculoskeletal: Negative for gait problem.  Skin: Negative for rash.  Allergic/Immunologic: Negative for environmental allergies.  Neurological: Negative for syncope.  Hematological: Bruises/bleeds easily.  Psychiatric/Behavioral: The patient is not nervous/anxious.        Objective:   Physical Exam VS: see vs page GEN: no distress HEAD: head: no deformity eyes: no periorbital swelling, no proptosis external nose and ears are normal mouth: no lesion seen NECK: supple, thyroid is not enlarged CHEST WALL: no deformity.  Old healed surgical scar (median sternotomy).   LUNGS: clear to  auscultation BREASTS:  No gynecomastia.  CV: reg rate and rhythm, no murmur ABD: abdomen is soft, nontender.  no hepatosplenomegaly.  not distended.  no hernia.   RECTAL: normal external and internal exam.  heme neg. MUSCULOSKELETAL: muscle bulk and strength are grossly normal.  no obvious joint swelling.  gait is normal and steady.  Old healed surgical scar at the lower back.  EXTEMITIES: no deformity.  no ulcer on the feet.  feet are of normal temp, but cyanotic while dependent.  1+ bilat leg edema PULSES: dorsalis pedis intact bilat.  no carotid bruit NEURO:  cn 2-12 grossly intact.   readily moves all 4's.  sensation is intact to touch on the feet,but decreased from normal.  SKIN:  Normal texture and temperature.  No rash or suspicious lesion is visible.  Old healed surgical scars on the legs (bilat vein harvest). NODES:  None palpable at the neck      Assessment & Plan:  Wellness visit today, with problems stable, except as noted.   SEPARATE EVALUATION FOLLOWS--EACH PROBLEM HERE IS NEW, NOT RESPONDING TO TREATMENT, OR POSES SIGNIFICANT RISK TO THE PATIENT'S HEALTH: HISTORY OF THE PRESENT ILLNESS: Pt says he has insomnia, which will be worse after having to sleep upright after upcoming eye surgery PAST MEDICAL HISTORY reviewed and up to date today REVIEW OF SYSTEMS: no weight change PHYSICAL EXAMINATION: VITAL SIGNS:  See vs page GENERAL: no distress PROSTATE:  Normal size.  No nodule.  PSYCH: Does not appear anxious nor depressed. LAB/XRAY RESULTS: UA: pos for UTI Lab Results  Component Value Date   TSH 3.04 01/31/2016  IMPRESSION: UTI: new, mild: If you have no symptoms, no treatment is needed. If you do have urine symptoms, please call or message [place]. Insomnia: will prob worsen with upcoming surgery.   Subjective:   Patient here for Medicare annual wellness visit and management of other chronic and acute problems.     Risk factors: advanced age    71 of  Physicians Providing Medical Care to Patient:  See "snapshot"   Activities of Daily Living: In your present state of health, do you have any difficulty performing the following activities?:  Preparing food and eating?: No  Bathing yourself: No  Getting dressed: No  Using the toilet: No  Moving around from place to place: No  In the past year have you fallen or had a near fall?: No    Home Safety: Has smoke detector and wears seat belts. No firearms. No excess sun exposure.  Diet and Exercise  Current exercise habits: pt says good Dietary issues discussed: pt reports a somewhat healthy diet.    Depression Screen  Q1: Over the past two weeks, have you felt down, depressed or hopeless? no  Q2: Over the past two weeks, have you felt little interest or pleasure in doing things? no   The following portions of the patient's history were reviewed and updated as appropriate: allergies, current medications, past family history, past medical history, past social history, past surgical history and problem list.   Review of Systems  Denies hearing loss, and visual loss Objective:   Vision:  Sees opthalmologist Dr Claudean Kinds, so he declines [facility] today.   Hearing: grossly normal.  Body mass index:  See vs page.   Msk: pt easily and quickly performs "get-up-and-go" from a sitting position.   Cognitive Impairment Assessment: cognition, memory and judgment appear normal.  remembers 2/3 at 5 minutes (? effort).  excellent recall.  can easily read and write a sentence.  alert and oriented x 3.     Assessment:   Medicare wellness utd on preventive parameters    Plan:   During the course of the visit the patient was educated and counseled about appropriate screening and preventive services including:        Fall prevention    Diabetes screening  Nutrition counseling   Vaccines / LABS are done today   Patient Instructions (the written plan) was given to the patient.

## 2016-01-31 NOTE — Patient Instructions (Addendum)
I have sent a prescription to your pharmacy, for sleep good diet and exercise significantly improve the control of your diabetes.  please let me know if you wish to be referred to a dietician.  high blood sugar is very risky to your health.  you should see an eye doctor and dentist every year.  It is very important to get all recommended vaccinations.  Please consider these measures for your health:  minimize alcohol.  Do not use tobacco products.  Have a colonoscopy at least every 10 years from age 48.  Keep firearms safely stored.  Always use seat belts.  have working smoke alarms in your home.  See an eye doctor and dentist regularly.  Never drive under the influence of alcohol or drugs (including prescription drugs).  Those with fair skin should take precautions against the sun, and should carefully examine their skin once per month, for any new or changed moles. It is critically important to prevent falling down (keep floor areas well-lit, dry, and free of loose objects.  If you have a cane, walker, or wheelchair, you should use it, even for short trips around the house.  Wear flat-soled shoes.  Also, try not to rush).   blood tests are requested for you today.  We'll let you know about the results.  Please come back for a follow-up appointment in 6 months.

## 2016-01-31 NOTE — Progress Notes (Signed)
we discussed code status.  pt requests full code, but would not want to be started or maintained on artificial life-support measures if there was not a reasonable chance of recovery 

## 2016-02-01 LAB — TESTOSTERONE,FREE AND TOTAL
Testosterone, Free: 13.7 pg/mL (ref 6.6–18.1)
Testosterone: 530 ng/dL (ref 264–916)

## 2016-02-03 ENCOUNTER — Telehealth: Payer: Self-pay | Admitting: Endocrinology

## 2016-02-03 DIAGNOSIS — H43822 Vitreomacular adhesion, left eye: Secondary | ICD-10-CM | POA: Diagnosis not present

## 2016-02-03 DIAGNOSIS — H35342 Macular cyst, hole, or pseudohole, left eye: Secondary | ICD-10-CM | POA: Diagnosis not present

## 2016-02-03 DIAGNOSIS — I10 Essential (primary) hypertension: Secondary | ICD-10-CM | POA: Diagnosis not present

## 2016-02-03 DIAGNOSIS — H35372 Puckering of macula, left eye: Secondary | ICD-10-CM | POA: Diagnosis not present

## 2016-02-03 NOTE — Telephone Encounter (Signed)
[facility] Call:  PT saw Dr. Loanne Drilling yesterday for preparation facial surgery.  He was prescribed medication to help him sleep and is having foot pain.  He needs advice on medication interaction.

## 2016-02-03 NOTE — Telephone Encounter (Signed)
Requested a call back from the patient to discuss.  

## 2016-02-04 DIAGNOSIS — I2510 Atherosclerotic heart disease of native coronary artery without angina pectoris: Secondary | ICD-10-CM | POA: Diagnosis not present

## 2016-02-04 DIAGNOSIS — E785 Hyperlipidemia, unspecified: Secondary | ICD-10-CM | POA: Diagnosis not present

## 2016-02-04 DIAGNOSIS — Z79899 Other long term (current) drug therapy: Secondary | ICD-10-CM | POA: Diagnosis not present

## 2016-02-04 DIAGNOSIS — H43822 Vitreomacular adhesion, left eye: Secondary | ICD-10-CM | POA: Diagnosis not present

## 2016-02-04 DIAGNOSIS — H35342 Macular cyst, hole, or pseudohole, left eye: Secondary | ICD-10-CM | POA: Diagnosis not present

## 2016-02-04 DIAGNOSIS — I10 Essential (primary) hypertension: Secondary | ICD-10-CM | POA: Diagnosis not present

## 2016-02-04 DIAGNOSIS — Z7982 Long term (current) use of aspirin: Secondary | ICD-10-CM | POA: Diagnosis not present

## 2016-02-04 DIAGNOSIS — E119 Type 2 diabetes mellitus without complications: Secondary | ICD-10-CM | POA: Diagnosis not present

## 2016-02-04 DIAGNOSIS — Z87891 Personal history of nicotine dependence: Secondary | ICD-10-CM | POA: Diagnosis not present

## 2016-02-04 DIAGNOSIS — Z48810 Encounter for surgical aftercare following surgery on the sense organs: Secondary | ICD-10-CM | POA: Diagnosis not present

## 2016-02-11 ENCOUNTER — Ambulatory Visit: Payer: Medicare HMO | Admitting: Cardiology

## 2016-02-21 ENCOUNTER — Encounter: Payer: Self-pay | Admitting: Endocrinology

## 2016-02-21 NOTE — Telephone Encounter (Signed)
Pt is calling to speak with nurse Pt has water bubble on right lower leg at ankle, he is putting neosporin and bandage on it but is has been since last Sunday, is not scabbing and is red no fever but is still raw looking, no swelling, no pain

## 2016-02-21 NOTE — Telephone Encounter (Signed)
Please advise on how to proceed.

## 2016-02-25 ENCOUNTER — Ambulatory Visit (INDEPENDENT_AMBULATORY_CARE_PROVIDER_SITE_OTHER): Payer: Medicare HMO | Admitting: Endocrinology

## 2016-02-25 ENCOUNTER — Telehealth: Payer: Self-pay | Admitting: Endocrinology

## 2016-02-25 DIAGNOSIS — L97909 Non-pressure chronic ulcer of unspecified part of unspecified lower leg with unspecified severity: Secondary | ICD-10-CM | POA: Insufficient documentation

## 2016-02-25 DIAGNOSIS — L97919 Non-pressure chronic ulcer of unspecified part of right lower leg with unspecified severity: Secondary | ICD-10-CM | POA: Diagnosis not present

## 2016-02-25 MED ORDER — CEPHALEXIN 250 MG PO CAPS: 250.0000 mg | ORAL_CAPSULE | Freq: Three times a day (TID) | ORAL | 0 refills | Status: DC

## 2016-02-25 NOTE — Telephone Encounter (Signed)
I contacted the patient and advised of message. Pateint voiced understanding and had no further questions at this time.  

## 2016-02-25 NOTE — Progress Notes (Signed)
Subjective:    Patient ID: Gerald Martinez, male    DOB: Jul 20, 1933, 82 y.o.   MRN: [id number]  HPI Pt states 9 days of moderate ulcer at the right leg, but no assoc pain.  He is unable to cite precip factor, such as local injury.   Past Medical History:  Diagnosis Date  . AAA (abdominal aortic aneurysm) ([place])   . BPH (benign prostatic hypertrophy)   . CAD (coronary artery disease)    Post CABG in 2000.  LIMA to the LAD, SVG to OM, SVG to PDA, last perfusion study in 2012 with no high risk findings  . Diabetes mellitus    Type II  . Dyslipidemia   . Esophageal cancer ([place])   . Hearing loss   . Hypertension     Past Surgical History:  Procedure Laterality Date  . CORONARY ARTERY BYPASS GRAFT  2000  . ESOPHAGECTOMY  2000  . VENTRAL HERNIA REPAIR  2001    Social History   Social History  . Marital status: Married    Spouse name: N/A  . Number of children: N/A  . Years of education: N/A   Occupational History  . Not on file.   Social History Main Topics  . Smoking status: Former Smoker    Quit date: 05/25/1997  . Smokeless tobacco: Never Used  . Alcohol use 3.6 oz/week    6 Glasses of wine per week  . Drug use: No  . Sexual activity: Not on file   Other Topics Concern  . Not on file   Social History Narrative  . No narrative on file    Current Outpatient Prescriptions on File Prior to Visit  Medication Sig Dispense Refill  . aspirin 325 MG EC tablet Take 325 mg by mouth daily.      Marland Kitchen doxazosin (CARDURA) 4 MG tablet TAKE 1 TABLET BY MOUTH DAILY. 90 tablet 2  . FREESTYLE LITE test strip USE AS INSTRUCTED ONCE DAILY 100 each 11  . furosemide (LASIX) 20 MG tablet TAKE 1 TABLET BY MOUTH DAILY. 90 tablet 0  . Lancets (FREESTYLE) lancets     . LIDODERM 5 % PLACE 1 PATCH ONTO THE SKIN DAILY. REMOVE & DISCARD PATCH WITHIN 12 HOURS OR AS DIRECTED BY MD 30 patch 11  . losartan (COZAAR) 25 MG tablet Take 1 tablet (25 mg total) by mouth daily. 90 tablet 3  . Melatonin 10  MG TABS Take 1 tablet by mouth as needed.    . metFORMIN (GLUCOPHAGE-XR) 500 MG 24 hr tablet TAKE 1 TABLET BY MOUTH DAILY WITH BREAKFAST. 90 tablet 3  . Multiple Vitamin (MULTIVITAMIN) tablet Take 1 tablet by mouth daily.      Marland Kitchen omeprazole (PRILOSEC) 20 MG capsule TAKE 2 CAPSULES (40 MG TOTAL) BY MOUTH DAILY. 180 capsule 11  . simvastatin (ZOCOR) 40 MG tablet TAKE 1 TABLET BY MOUTH AT BEDTIME. 90 tablet 2  . TESTIM 50 MG/5GM (1%) GEL APPLY THE CONTENTS OF 2 TUBES ONTO THE SKIN DAILY. 300 g 5  . traZODone (DESYREL) 100 MG tablet Take 1 tablet (100 mg total) by mouth at bedtime as needed for sleep. 30 tablet 3   No current facility-administered medications on file prior to visit.     Allergies  Allergen Reactions  . Propoxyphene N-Acetaminophen     Family History  Problem Relation Age of Onset  . Heart disease Mother   . Stroke Mother   . Cancer Father     Lymphoma  .  Cancer Sister     Uncertain  . Colon cancer Neg Hx     BP 118/70   Pulse 64   Ht 5' 11.5" (1.816 m)   Wt 212 lb (96.2 kg)   SpO2 95%   BMI 29.16 kg/m    Review of Systems Denies drainage and fever.  Insomnia is much better on rx.      Objective:   Physical Exam VITAL SIGNS:  See vs page.  GENERAL: no distress.  Right leg: 5x3 cm shallow ulcer, with slight surrounding erythema.        Assessment & Plan:  Leg ulcer, new Insomnia: well-controlled Patient is advised the following: Patient Instructions  I have sent a prescription to your pharmacy, for an antibiotic pill.  Keep it covered with antibiotic ointment and a bandaid.   Please see a wound specialist.  you will receive a phone call, about a day and time for an appointment.   Please continue the same medication for sleep.   I'll see you next time.

## 2016-02-25 NOTE — Telephone Encounter (Signed)
See message and please advise on which ointment needs to be sent.

## 2016-02-25 NOTE — Patient Instructions (Addendum)
I have sent a prescription to your pharmacy, for an antibiotic pill.  Keep it covered with antibiotic ointment and a bandaid.   Please see a wound specialist.  you will receive a phone call, about a day and time for an appointment.   Please continue the same medication for sleep.   I'll see you next time.

## 2016-02-25 NOTE — Telephone Encounter (Signed)
The antibiotic ointment is non-prescription store-brand.  I'm sorry I was not clear

## 2016-02-25 NOTE — Telephone Encounter (Signed)
Pt called and said that the pharmacy received the pill, but did not receive antibiotic ointment. Please resubmit.

## 2016-02-29 ENCOUNTER — Other Ambulatory Visit: Payer: Self-pay | Admitting: Endocrinology

## 2016-03-13 ENCOUNTER — Encounter (HOSPITAL_BASED_OUTPATIENT_CLINIC_OR_DEPARTMENT_OTHER): Payer: Medicare HMO | Attending: Internal Medicine

## 2016-03-15 NOTE — Progress Notes (Signed)
HPI Patient presents for followup of his known coronary disease. Since I last saw him he has had multiple eye surgeries. He is just recovering from this. Because of this he has an exercise as much as I would like.   He has had no new symptoms since he had a stress perfusion study in 2012. The patient denies any new symptoms such as chest discomfort, neck or arm discomfort. There has been no new shortness of breath, PND or orthopnea. There have been no reported palpitations, presyncope or syncope.  Allergies  Allergen Reactions  . Propoxyphene N-Acetaminophen     Current Outpatient Prescriptions  Medication Sig Dispense Refill  . aspirin 325 MG EC tablet Take 325 mg by mouth daily.      . cephALEXin (KEFLEX) 250 MG capsule Take 1 capsule (250 mg total) by mouth 3 (three) times daily. 21 capsule 0  . doxazosin (CARDURA) 4 MG tablet TAKE 1 TABLET BY MOUTH DAILY. 90 tablet 2  . FREESTYLE LITE test strip USE AS INSTRUCTED ONCE DAILY 100 each 11  . furosemide (LASIX) 20 MG tablet TAKE 1 TABLET BY MOUTH DAILY. 90 tablet 0  . Lancets (FREESTYLE) lancets     . LIDODERM 5 % PLACE 1 PATCH ONTO THE SKIN DAILY. REMOVE & DISCARD PATCH WITHIN 12 HOURS OR AS DIRECTED BY MD 30 patch 11  . losartan (COZAAR) 25 MG tablet Take 1 tablet (25 mg total) by mouth daily. 90 tablet 3  . Melatonin 10 MG TABS Take 1 tablet by mouth as needed.    . metFORMIN (GLUCOPHAGE-XR) 500 MG 24 hr tablet TAKE 1 TABLET BY MOUTH DAILY WITH BREAKFAST. 90 tablet 3  . Multiple Vitamin (MULTIVITAMIN) tablet Take 1 tablet by mouth daily.      Marland Kitchen omeprazole (PRILOSEC) 20 MG capsule TAKE 2 CAPSULES (40 MG TOTAL) BY MOUTH DAILY. 180 capsule 11  . simvastatin (ZOCOR) 40 MG tablet TAKE 1 TABLET BY MOUTH AT BEDTIME. 90 tablet 2  . TESTIM 50 MG/5GM (1%) GEL APPLY THE CONTENTS OF 2 TUBES ONTO THE SKIN DAILY. 300 g 5  . traZODone (DESYREL) 100 MG tablet Take 1 tablet (100 mg total) by mouth at bedtime as needed for sleep. 30 tablet 3   No  current facility-administered medications for this visit.     Past Medical History:  Diagnosis Date  . AAA (abdominal aortic aneurysm) ([place])   . BPH (benign prostatic hypertrophy)   . CAD (coronary artery disease)    Post CABG in 2000.  LIMA to the LAD, SVG to OM, SVG to PDA, last perfusion study in 2012 with no high risk findings  . Diabetes mellitus    Type II  . Dyslipidemia   . Esophageal cancer ([place])   . Hearing loss   . Hypertension     Past Surgical History:  Procedure Laterality Date  . CORONARY ARTERY BYPASS GRAFT  2000  . ESOPHAGECTOMY  2000  . VENTRAL HERNIA REPAIR  2001    ROS:   As stated in the HPI and negative for all other systems.  PHYSICAL EXAM BP 130/62 (BP Location: Left Arm, Patient Position: Sitting, Cuff Size: Normal)   Pulse 63   Ht 5\' 11"  (1.803 m)   Wt 218 lb 9.6 oz (99.2 kg)   BMI 30.49 kg/m  GENERAL:  Well appearing  NECK:  No jugular venous distention, waveform within normal limits, carotid upstroke brisk and symmetric, no bruits, no thyromegaly LUNGS:  Clear to auscultation bilaterally BACK:  No CVA tenderness CHEST:  Well healed sternotomy scar. HEART:  PMI not displaced or sustained,S1 and S2 within normal limits, no S3, no S4, no clicks, no rubs, no murmurs ABD:  Flat, positive bowel sounds normal in frequency in pitch, no bruits, no rebound, no guarding, no midline pulsatile mass, no hepatomegaly, no splenomegaly, nonhealing abdominal wounds EXT:  2 plus pulses throughout, mild right greater than left leg edema, no cyanosis no clubbing, venous stasis changes   EKG:  Sinus rhythm, rate 63, RAD,  intervals within normal limits, no acute ST-T wave changes.  03/16/2016  Lab Results  Component Value Date   CHOL 136 01/31/2016   TRIG 129.0 01/31/2016   HDL 44.00 01/31/2016   LDLCALC 67 01/31/2016   Lab Results  Component Value Date   HGBA1C 6.3 01/31/2016    ASSESSMENT AND PLAN  CORONARY ATHEROSCLEROSIS NATIVE CORONARY ARTERY  - He has had no new symptoms since his stress test in 2012. No further cardiovascular testing is suggested. He will continue with risk reduction.  HYPERTENSION -  The blood pressure is at target. No change in medications is indicated. We will continue with therapeutic lifestyle changes (TLC).  HYPERCHOLESTEROLEMIA -  His lipids were excellent in Sept.  No change in therapy is planned.   Overweight -  The patient understands the need to lose weight with diet and exercise.     AAA - He had a stable repair three years ago  and no further testing is indicated.

## 2016-03-16 ENCOUNTER — Encounter: Payer: Self-pay | Admitting: Cardiology

## 2016-03-16 ENCOUNTER — Encounter: Payer: Self-pay | Admitting: Endocrinology

## 2016-03-16 ENCOUNTER — Ambulatory Visit (INDEPENDENT_AMBULATORY_CARE_PROVIDER_SITE_OTHER): Payer: Medicare HMO | Admitting: Cardiology

## 2016-03-16 VITALS — BP 130/62 | HR 63 | Ht 71.0 in | Wt 218.6 lb

## 2016-03-16 DIAGNOSIS — I2510 Atherosclerotic heart disease of native coronary artery without angina pectoris: Secondary | ICD-10-CM | POA: Diagnosis not present

## 2016-03-16 DIAGNOSIS — I10 Essential (primary) hypertension: Secondary | ICD-10-CM

## 2016-03-16 NOTE — Patient Instructions (Signed)

## 2016-03-17 ENCOUNTER — Encounter: Payer: Self-pay | Admitting: Cardiology

## 2016-04-14 ENCOUNTER — Ambulatory Visit: Payer: Medicare HMO | Admitting: Podiatry

## 2016-04-27 ENCOUNTER — Other Ambulatory Visit: Payer: Self-pay | Admitting: Endocrinology

## 2016-04-27 DIAGNOSIS — R69 Illness, unspecified: Secondary | ICD-10-CM | POA: Diagnosis not present

## 2016-04-28 ENCOUNTER — Ambulatory Visit (INDEPENDENT_AMBULATORY_CARE_PROVIDER_SITE_OTHER): Payer: Medicare HMO | Admitting: Podiatry

## 2016-04-28 ENCOUNTER — Encounter: Payer: Self-pay | Admitting: Podiatry

## 2016-04-28 VITALS — Ht 71.0 in | Wt 218.0 lb

## 2016-04-28 DIAGNOSIS — B351 Tinea unguium: Secondary | ICD-10-CM | POA: Diagnosis not present

## 2016-04-28 DIAGNOSIS — M79676 Pain in unspecified toe(s): Secondary | ICD-10-CM | POA: Diagnosis not present

## 2016-04-28 DIAGNOSIS — E1140 Type 2 diabetes mellitus with diabetic neuropathy, unspecified: Secondary | ICD-10-CM

## 2016-04-28 NOTE — Progress Notes (Signed)
Patient ID: Gerald Martinez, male   DOB: 1933/06/20, 82 y.o.   MRN: [id number] Complaint:  Visit Type: Patient returns to my office for continued preventative foot care services. Complaint: Patient states" my nails have grown long and thick and become painful to walk and wear shoes" Patient has been diagnosed with DM with no foot complications. The patient presents for preventative foot care services. No changes to ROS.  He requests diabetic insoles at this visit.   Podiatric Exam: Vascular: dorsalis pedis and posterior tibial pulses are palpable bilateral. Capillary return is immediate. Temperature gradient is WNL. Skin turgor WNL  Sensorium: Diminished l Semmes Weinstein monofilament test. Normal tactile sensation bilaterally. Nail Exam: Pt has thick disfigured discolored nails with subungual debris noted bilateral entire nail hallux through fifth toenails.  His 1 hallux left foot has two separate spicules.  and  2, 3 toenails right foot have separate nail spicules.   His left hallux nail is bleeding.   Ulcer Exam: There is no evidence of ulcer or pre-ulcerative changes or infection. Orthopedic Exam: Muscle tone and strength are WNL. No limitations in general ROM. No crepitus or effusions noted. Foot type and digits show no abnormalities. Bony prominences are unremarkable. Skin: No Porokeratosis. No infection or ulcers  Diagnosis:  Onychomycosis, , Pain in right toe, pain in left toes,   Diabetic neuropathy.  Treatment & Plan Procedures and Treatment: Consent by patient was obtained for treatment procedures. The patient understood the discussion of treatment and procedures well. All questions were answered thoroughly reviewed. Debridement of mycotic and hypertrophic toenails, 1 through 5 bilateral and clearing of subungual debris. No ulceration, no infection noted.  Return Visit-Office Procedure: Patient instructed to return to the office for a follow up visit 10 weeks  for continued evaluation and  treatment.  Gardiner Barefoot DPM

## 2016-05-15 ENCOUNTER — Other Ambulatory Visit: Payer: Self-pay | Admitting: Endocrinology

## 2016-05-27 ENCOUNTER — Ambulatory Visit: Payer: Medicare HMO | Admitting: Podiatry

## 2016-06-02 ENCOUNTER — Telehealth: Payer: Self-pay | Admitting: *Deleted

## 2016-06-02 NOTE — Telephone Encounter (Addendum)
Pt states he has several questions about his orthotics and would like to know if he needs a new rx or may need to do something different. I spoke with pt and he states it is time for new soft orthotics and he wants them like he has now. Pt states that the notes have to say he is diabetic to have the insoles covered, and that he is scanned in office and they are made somewhere else. 07/08/2016-Pt states he is waiting for someone to contact him about his orthotics and has called 2 different times, he would like to know if he needs an appt to be molded for them.

## 2016-06-09 ENCOUNTER — Ambulatory Visit: Payer: Medicare HMO | Admitting: Podiatry

## 2016-06-11 ENCOUNTER — Other Ambulatory Visit: Payer: Self-pay | Admitting: Endocrinology

## 2016-06-17 ENCOUNTER — Ambulatory Visit (INDEPENDENT_AMBULATORY_CARE_PROVIDER_SITE_OTHER): Payer: Medicare HMO | Admitting: Podiatry

## 2016-06-17 ENCOUNTER — Encounter: Payer: Self-pay | Admitting: Podiatry

## 2016-06-17 DIAGNOSIS — E0851 Diabetes mellitus due to underlying condition with diabetic peripheral angiopathy without gangrene: Secondary | ICD-10-CM

## 2016-06-17 DIAGNOSIS — E1140 Type 2 diabetes mellitus with diabetic neuropathy, unspecified: Secondary | ICD-10-CM

## 2016-06-17 NOTE — Progress Notes (Signed)
Patient ID: Gerald Martinez, male   DOB: 1933-11-03, 83 y.o.   MRN: [id number] Complaint:  Visit Type: Patient returns to my office for her of diabetic insoles. Patient is a diabetic with neuropathy. I recommended that we obtain a new pair of diabetic shoes, which will include new insoles.  Podiatric Exam: Vascular: dorsalis pedis  are palpable bilateral.  Posterior tibial pulses are non palpable  B/L  Capillary return is immediate. Temperature gradient is WNL. Skin turgor WNL Venous stasis noted feet and legs  B/L Sensorium: Diminished Semmes Weinstein monofilament test. Normal tactile sensation bilaterally. Nail Exam: Pt has thick disfigured discolored nails with subungual debris noted bilateral entire nail hallux through fifth toenails.     Ulcer Exam: There is no evidence of ulcer or pre-ulcerative changes or infection. Orthopedic Exam: Muscle tone and strength are WNL. No limitations in general ROM. No crepitus or effusions noted. Foot type and digits show no abnormalities. Hammer toes  B/L. Skin: No Porokeratosis. No infection or ulcers  Diagnosis:    Diabetic neuropathy. Diabetic angiopathy  Treatment & Plan Procedures and Treatment: Diabetic examination of his feet were performed. He was noted to have diabetic angiopathy and diabetic neuropathy. He also was noted to have hammertoes bilaterally. I will initiate paperwork for diabetic shoes which will include a new pair of diabetic insoles Return Visit-Office Procedure: Patient instructed to return to the office for follow-up when he was called by the [place] office  Gardiner Barefoot DPM

## 2016-07-01 ENCOUNTER — Telehealth: Payer: Self-pay | Admitting: Endocrinology

## 2016-07-01 NOTE — Telephone Encounter (Signed)
Ok, I'll do form

## 2016-07-01 NOTE — Telephone Encounter (Signed)
I contacted the patient and advised of message.  I contacted Dr. Prudence Davidson and requested the paperwork to be faxed to our office for MD to look over.

## 2016-07-01 NOTE — Telephone Encounter (Signed)
Dr. Prudence Davidson at [facility] is requesting [place] to approve Diabetic shoes with foot support

## 2016-07-01 NOTE — Telephone Encounter (Signed)
See message and please advise, Thanks!  

## 2016-07-13 ENCOUNTER — Ambulatory Visit (INDEPENDENT_AMBULATORY_CARE_PROVIDER_SITE_OTHER): Payer: Medicare HMO | Admitting: Podiatry

## 2016-07-13 DIAGNOSIS — M722 Plantar fascial fibromatosis: Secondary | ICD-10-CM | POA: Diagnosis not present

## 2016-07-13 DIAGNOSIS — L608 Other nail disorders: Secondary | ICD-10-CM

## 2016-07-13 DIAGNOSIS — L603 Nail dystrophy: Secondary | ICD-10-CM | POA: Diagnosis not present

## 2016-07-13 DIAGNOSIS — B351 Tinea unguium: Secondary | ICD-10-CM | POA: Diagnosis not present

## 2016-07-13 DIAGNOSIS — E0843 Diabetes mellitus due to underlying condition with diabetic autonomic (poly)neuropathy: Secondary | ICD-10-CM

## 2016-07-13 DIAGNOSIS — E1140 Type 2 diabetes mellitus with diabetic neuropathy, unspecified: Secondary | ICD-10-CM

## 2016-07-13 DIAGNOSIS — E0851 Diabetes mellitus due to underlying condition with diabetic peripheral angiopathy without gangrene: Secondary | ICD-10-CM | POA: Diagnosis not present

## 2016-07-13 DIAGNOSIS — M79609 Pain in unspecified limb: Secondary | ICD-10-CM

## 2016-07-17 ENCOUNTER — Telehealth: Payer: Self-pay | Admitting: *Deleted

## 2016-07-17 MED ORDER — GABAPENTIN 100 MG PO CAPS: 100.0000 mg | ORAL_CAPSULE | Freq: Three times a day (TID) | ORAL | 3 refills | Status: DC

## 2016-07-17 NOTE — Telephone Encounter (Addendum)
Pt states he was just in the office to see Dr. Amalia Hailey a few days ago, and forgot to ask for Gabapentin. Pt states he has old Gabapentin he is using for the stabbing nerve pain in his feet. I reviewed pt's Medication orders and there is no Gabapentin from our office. I spoke with pt and told him I would inform Dr. Amalia Hailey of pt's nerve pain and call pt with his orders. Dr. Amalia Hailey ordered Gabapentin 100mg  #90 +3 refills, one capsule tid for neuropathy. Orders to pt and [facility].

## 2016-07-21 NOTE — Progress Notes (Signed)
   SUBJECTIVE Patient with a history of diabetes mellitus presents to office today complaining of elongated, thickened nails. Pain while ambulating in shoes. Patient is unable to trim their own nails.   OBJECTIVE General Patient is awake, alert, and oriented x 3 and in no acute distress. Derm Skin is dry and supple bilateral. Negative open lesions or macerations. Remaining integument unremarkable. Nails are tender, long, thickened and dystrophic with subungual debris, consistent with onychomycosis, 1-5 bilateral. No signs of infection noted. Vasc  DP and PT pedal pulses palpable bilaterally. Temperature gradient within normal limits.  Neuro Epicritic and protective threshold sensation diminished bilaterally.  Musculoskeletal Exam No symptomatic pedal deformities noted bilateral. Muscular strength within normal limits.  ASSESSMENT 1. Diabetes Mellitus w/ peripheral neuropathy 2. Onychomycosis of nail due to dermatophyte bilateral 3. Pain in foot bilateral  PLAN OF CARE 1. Patient evaluated today. 2. Instructed to maintain good pedal hygiene and foot care. Stressed importance of controlling blood sugar.  3. Mechanical debridement of nails 1-5 bilaterally performed using a nail nipper. Filed with dremel without incident.  4. Return to clinic in 3 mos.     Brent M. Evans, DPM [facility]  Dr. Brent M. Evans, DPM    [address]                                        [place], [place] [address]                Office [phone]  Fax [phone]       

## 2016-07-23 DIAGNOSIS — H43822 Vitreomacular adhesion, left eye: Secondary | ICD-10-CM | POA: Diagnosis not present

## 2016-07-23 DIAGNOSIS — Z961 Presence of intraocular lens: Secondary | ICD-10-CM | POA: Diagnosis not present

## 2016-07-23 DIAGNOSIS — H35342 Macular cyst, hole, or pseudohole, left eye: Secondary | ICD-10-CM | POA: Diagnosis not present

## 2016-07-28 ENCOUNTER — Ambulatory Visit: Payer: Medicare HMO | Admitting: Podiatry

## 2016-07-31 ENCOUNTER — Ambulatory Visit: Payer: Medicare HMO | Admitting: Endocrinology

## 2016-08-03 ENCOUNTER — Ambulatory Visit: Payer: Medicare HMO

## 2016-08-03 ENCOUNTER — Ambulatory Visit: Payer: Medicare HMO | Admitting: Endocrinology

## 2016-08-05 ENCOUNTER — Ambulatory Visit (INDEPENDENT_AMBULATORY_CARE_PROVIDER_SITE_OTHER): Payer: Self-pay | Admitting: Podiatry

## 2016-08-05 DIAGNOSIS — M722 Plantar fascial fibromatosis: Secondary | ICD-10-CM

## 2016-08-05 NOTE — Patient Instructions (Signed)

## 2016-08-06 ENCOUNTER — Telehealth: Payer: Self-pay | Admitting: Endocrinology

## 2016-08-06 ENCOUNTER — Ambulatory Visit (INDEPENDENT_AMBULATORY_CARE_PROVIDER_SITE_OTHER): Payer: Medicare HMO | Admitting: Endocrinology

## 2016-08-06 ENCOUNTER — Encounter: Payer: Self-pay | Admitting: Endocrinology

## 2016-08-06 DIAGNOSIS — C159 Malignant neoplasm of esophagus, unspecified: Secondary | ICD-10-CM | POA: Diagnosis not present

## 2016-08-06 DIAGNOSIS — Z8501 Personal history of malignant neoplasm of esophagus: Secondary | ICD-10-CM | POA: Insufficient documentation

## 2016-08-06 NOTE — Telephone Encounter (Signed)
please call patient: Ins is challenging omeprazole prescription.  What other medication have you tried.  Do you want to go off it, on a trial basis?

## 2016-08-06 NOTE — Telephone Encounter (Signed)
Patient has an appointment with Dr. Loanne Drilling on 08/06/2016. Dr. Loanne Drilling will discuss directly with the patient.

## 2016-08-06 NOTE — Patient Instructions (Addendum)
Please continue the same medications. Please let us know what mount of losartan you take.  Please come back for a follow-up appointment in 6 months.

## 2016-08-06 NOTE — Progress Notes (Signed)
Subjective:    Patient ID: Gerald Martinez, male    DOB: Apr 01, 1934, 83 y.o.   MRN: [id number]  HPI  The state of at least three ongoing medical problems is addressed today, with interval history of each noted here: GERD: pt says he has been on prilosec since 2000 esophagectomy. pathol showed cancer.  He has heartburn if he misses even 1 dose.  HTN: he denies chest pain.   hypgonadism: he denies decreased urinary stream.  Past Medical History:  Diagnosis Date  . AAA (abdominal aortic aneurysm) ([place])   . BPH (benign prostatic hypertrophy)   . CAD (coronary artery disease)    Post CABG in 2000.  LIMA to the LAD, SVG to OM, SVG to PDA, last perfusion study in 2012 with no high risk findings  . Diabetes mellitus    Type II  . Dyslipidemia   . Esophageal cancer ([place])   . Hearing loss   . Hypertension     Past Surgical History:  Procedure Laterality Date  . CORONARY ARTERY BYPASS GRAFT  2000  . ESOPHAGECTOMY  2000  . VENTRAL HERNIA REPAIR  2001    Social History   Social History  . Marital status: Married    Spouse name: N/A  . Number of children: N/A  . Years of education: N/A   Occupational History  . Not on file.   Social History Main Topics  . Smoking status: Former Smoker    Quit date: 05/25/1997  . Smokeless tobacco: Never Used  . Alcohol use 3.6 oz/week    6 Glasses of wine per week  . Drug use: No  . Sexual activity: Not on file   Other Topics Concern  . Not on file   Social History Narrative  . No narrative on file    Current Outpatient Prescriptions on File Prior to Visit  Medication Sig Dispense Refill  . aspirin 325 MG EC tablet Take 325 mg by mouth daily.      Marland Kitchen doxazosin (CARDURA) 4 MG tablet TAKE 1 TABLET BY MOUTH DAILY. 90 tablet 2  . FREESTYLE LITE test strip USE AS INSTRUCTED ONCE DAILY 90 each 3  . furosemide (LASIX) 20 MG tablet TAKE 1 TABLET BY MOUTH DAILY. 90 tablet 1  . gabapentin (NEURONTIN) 100 MG capsule Take 1 capsule (100 mg total) by  mouth 3 (three) times daily. 90 capsule 3  . Lancets (FREESTYLE) lancets USE AS DIRECTED DAILY. 100 each PRN  . LIDODERM 5 % PLACE 1 PATCH ONTO THE SKIN DAILY. REMOVE & DISCARD PATCH WITHIN 12 HOURS OR AS DIRECTED BY MD 30 patch 11  . losartan (COZAAR) 25 MG tablet Take 1 tablet (25 mg total) by mouth daily. 90 tablet 3  . losartan (COZAAR) 50 MG tablet TAKE 1 TABLET BY MOUTH ONCE DAILY. 90 tablet PRN  . Melatonin 10 MG TABS Take 1 tablet by mouth as needed.    . metFORMIN (GLUCOPHAGE-XR) 500 MG 24 hr tablet TAKE 1 TABLET BY MOUTH DAILY WITH BREAKFAST. 90 tablet 3  . Multiple Vitamin (MULTIVITAMIN) tablet Take 1 tablet by mouth daily.      Marland Kitchen omeprazole (PRILOSEC) 20 MG capsule TAKE 2 CAPSULES (40 MG TOTAL) BY MOUTH DAILY. 180 capsule 12  . simvastatin (ZOCOR) 40 MG tablet TAKE 1 TABLET BY MOUTH AT BEDTIME. 90 tablet 2  . TESTIM 50 MG/5GM (1%) GEL APPLY THE CONTENTS OF 2 TUBES ONTO THE SKIN DAILY. 300 g 5  . traZODone (DESYREL) 100 MG tablet  Take 1 tablet (100 mg total) by mouth at bedtime as needed for sleep. 30 tablet 3   No current facility-administered medications on file prior to visit.     Allergies  Allergen Reactions  . Propoxyphene N-Acetaminophen     Family History  Problem Relation Age of Onset  . Heart disease Mother   . Stroke Mother   . Cancer Father     Lymphoma  . Cancer Sister     Uncertain  . Colon cancer Neg Hx     BP 132/78   Pulse 71   Ht 5\' 11"  (1.803 m)   Wt 214 lb (97.1 kg)   SpO2 95%   BMI 29.85 kg/m   Review of Systems Denies SOB and weight change.      Objective:   Physical Exam VITAL SIGNS:  See vs page.  GENERAL: no distress.   GENITALIA: Normal male testicles, scrotum, and penis.       Assessment & Plan:  HTN: well-controlled, on uncertain dosage of losartan. Hypogonadism: tolerating rx well.  Please continue the same medication. GERD: he needs lifelong PPI rx.  Patient is advised the following: Patient Instructions  Please  continue the same medications. Please let us know what mount of losartan you take.  Please come back for a follow-up appointment in 6 months.

## 2016-08-10 NOTE — Progress Notes (Signed)
Patient presents for orthotic pick up.  Verbal and written break in and wear instructions given.  Patient will follow up in 4 weeks if symptoms worsen or fail to improve. 

## 2016-09-08 ENCOUNTER — Other Ambulatory Visit: Payer: Self-pay | Admitting: Endocrinology

## 2016-09-08 MED ORDER — TESTOSTERONE 50 MG/5GM (1%) TD GEL: TRANSDERMAL | 5 refills | Status: DC

## 2016-09-09 ENCOUNTER — Other Ambulatory Visit: Payer: Self-pay | Admitting: Endocrinology

## 2016-09-09 NOTE — Telephone Encounter (Signed)
Pt called in and said that [facility] did not received the Testim Script that was sent in, he asks that we resubmit the prescription.

## 2016-09-09 NOTE — Telephone Encounter (Signed)
Left message to return call to our office for pharmacy to call office.

## 2016-10-14 ENCOUNTER — Ambulatory Visit (INDEPENDENT_AMBULATORY_CARE_PROVIDER_SITE_OTHER): Payer: Medicare HMO | Admitting: Podiatry

## 2016-10-14 ENCOUNTER — Encounter: Payer: Self-pay | Admitting: Podiatry

## 2016-10-14 DIAGNOSIS — E0842 Diabetes mellitus due to underlying condition with diabetic polyneuropathy: Secondary | ICD-10-CM | POA: Diagnosis not present

## 2016-10-14 DIAGNOSIS — B351 Tinea unguium: Secondary | ICD-10-CM | POA: Diagnosis not present

## 2016-10-14 DIAGNOSIS — M79676 Pain in unspecified toe(s): Secondary | ICD-10-CM

## 2016-10-16 ENCOUNTER — Encounter: Payer: Self-pay | Admitting: Endocrinology

## 2016-10-16 ENCOUNTER — Ambulatory Visit (INDEPENDENT_AMBULATORY_CARE_PROVIDER_SITE_OTHER): Payer: Medicare HMO | Admitting: Endocrinology

## 2016-10-16 ENCOUNTER — Telehealth: Payer: Self-pay | Admitting: Endocrinology

## 2016-10-16 DIAGNOSIS — R05 Cough: Secondary | ICD-10-CM

## 2016-10-16 DIAGNOSIS — R059 Cough, unspecified: Secondary | ICD-10-CM

## 2016-10-16 MED ORDER — PROMETHAZINE-CODEINE 6.25-10 MG/5ML PO SYRP: 5.0000 mL | ORAL_SOLUTION | ORAL | 1 refills | Status: DC | PRN

## 2016-10-16 NOTE — Progress Notes (Signed)
   SUBJECTIVE Patient with a history of diabetes mellitus presents to office today complaining of elongated, thickened nails. Pain while ambulating in shoes. Patient is unable to trim their own nails.   OBJECTIVE General Patient is awake, alert, and oriented x 3 and in no acute distress. Derm Skin is dry and supple bilateral. Negative open lesions or macerations. Remaining integument unremarkable. Nails are tender, long, thickened and dystrophic with subungual debris, consistent with onychomycosis, 1-5 bilateral. No signs of infection noted. Vasc  DP and PT pedal pulses palpable bilaterally. Temperature gradient within normal limits.  Neuro Epicritic and protective threshold sensation diminished bilaterally.  Musculoskeletal Exam No symptomatic pedal deformities noted bilateral. Muscular strength within normal limits.  ASSESSMENT 1. Diabetes Mellitus w/ peripheral neuropathy 2. Onychomycosis of nail due to dermatophyte bilateral 3. Pain in foot bilateral  PLAN OF CARE 1. Patient evaluated today. 2. Instructed to maintain good pedal hygiene and foot care. Stressed importance of controlling blood sugar.  3. Mechanical debridement of nails 1-5 bilaterally performed using a nail nipper. Filed with dremel without incident.  4. Return to clinic in 3 mos.     Brent M. Evans, DPM [facility]  Dr. Brent M. Evans, DPM    [address]                                        [place], [place] [address]                Office [phone]  Fax [phone]       

## 2016-10-16 NOTE — Patient Instructions (Signed)
Here is a prescription for cough syrup. I hope you feel better soon.  If you don't feel better in 2 weeks, please call back.  Please call sooner if you get worse.

## 2016-10-16 NOTE — Progress Notes (Signed)
Subjective:    Patient ID: SEYDOU HEARNS, male    DOB: [birth year], 83 y.o.   MRN: [id number]  HPI Pt states 1 week of moderate dry-quality cough in the chest, but no assoc rhinorrhea.  Pt says this is seasonal, and responds to suppressive rx. No help with claritin.   Past Medical History:  Diagnosis Date  . AAA (abdominal aortic aneurysm) ([place])   . BPH (benign prostatic hypertrophy)   . CAD (coronary artery disease)    Post CABG in 2000.  LIMA to the LAD, SVG to OM, SVG to PDA, last perfusion study in 2012 with no high risk findings  . Diabetes mellitus    Type II  . Dyslipidemia   . Esophageal cancer ([place])   . Hearing loss   . Hypertension     Past Surgical History:  Procedure Laterality Date  . CORONARY ARTERY BYPASS GRAFT  2000  . ESOPHAGECTOMY  2000  . VENTRAL HERNIA REPAIR  2001    Social History   Social History  . Marital status: Married    Spouse name: N/A  . Number of children: N/A  . Years of education: N/A   Occupational History  . Not on file.   Social History Main Topics  . Smoking status: Former Smoker    Quit date: 05/25/1997  . Smokeless tobacco: Never Used  . Alcohol use 3.6 oz/week    6 Glasses of wine per week  . Drug use: No  . Sexual activity: Not on file   Other Topics Concern  . Not on file   Social History Narrative  . No narrative on file    Current Outpatient Prescriptions on File Prior to Visit  Medication Sig Dispense Refill  . aspirin 325 MG EC tablet Take 325 mg by mouth daily.      Marland Kitchen doxazosin (CARDURA) 4 MG tablet TAKE 1 TABLET BY MOUTH DAILY. 90 tablet 2  . FREESTYLE LITE test strip USE AS INSTRUCTED ONCE DAILY 90 each 3  . furosemide (LASIX) 20 MG tablet TAKE 1 TABLET BY MOUTH DAILY. 90 tablet 1  . gabapentin (NEURONTIN) 100 MG capsule Take 1 capsule (100 mg total) by mouth 3 (three) times daily. 90 capsule 3  . Lancets (FREESTYLE) lancets USE AS DIRECTED DAILY. 100 each PRN  . LIDODERM 5 % PLACE 1 PATCH ONTO THE SKIN  DAILY. REMOVE & DISCARD PATCH WITHIN 12 HOURS OR AS DIRECTED BY MD 30 patch 11  . losartan (COZAAR) 50 MG tablet TAKE 1 TABLET BY MOUTH ONCE DAILY. 90 tablet PRN  . Melatonin 10 MG TABS Take 1 tablet by mouth as needed.    . metFORMIN (GLUCOPHAGE-XR) 500 MG 24 hr tablet TAKE 1 TABLET BY MOUTH DAILY WITH BREAKFAST. 90 tablet 3  . Multiple Vitamin (MULTIVITAMIN) tablet Take 1 tablet by mouth daily.      Marland Kitchen omeprazole (PRILOSEC) 20 MG capsule TAKE 2 CAPSULES (40 MG TOTAL) BY MOUTH DAILY. (Patient taking differently: TAKE 1 CAPSULE (20 MG TOTAL) BY MOUTH DAILY.) 180 capsule 12  . simvastatin (ZOCOR) 40 MG tablet TAKE 1 TABLET BY MOUTH AT BEDTIME. 90 tablet 2  . testosterone (TESTIM) 50 MG/5GM (1%) GEL APPLY THE CONTENTS OF 2 TUBES ONTO THE SKIN DAILY. 300 g 5  . traZODone (DESYREL) 100 MG tablet Take 1 tablet (100 mg total) by mouth at bedtime as needed for sleep. 30 tablet 3   No current facility-administered medications on file prior to visit.     Allergies  Allergen Reactions  . Propoxyphene N-Acetaminophen     Family History  Problem Relation Age of Onset  . Heart disease Mother   . Stroke Mother   . Cancer Father        Lymphoma  . Cancer Sister        Uncertain  . Colon cancer Neg Hx     BP 112/62   Pulse 68   Ht 5\' 11"  (1.803 m)   Wt 221 lb (100.2 kg)   SpO2 95%   BMI 30.82 kg/m    Review of Systems Denies wheezing, heartburn, earache, and sob.     Objective:   Physical Exam VITAL SIGNS:  See vs page GENERAL: no distress head: no deformity  eyes: no periorbital swelling, no proptosis  external nose and ears are normal  mouth: no lesion seen Both eac's and tm's are normal.   LUNGS:  Clear to auscultation.       Assessment & Plan:  Cough, recurrent, prob due to allergic rhinitis.   Patient Instructions  Here is a prescription for cough syrup. I hope you feel better soon.  If you don't feel better in 2 weeks, please call back.  Please call sooner if you get  worse.

## 2016-10-16 NOTE — Telephone Encounter (Signed)
Patient notified and coming in at 28 today.

## 2016-10-16 NOTE — Telephone Encounter (Signed)
Ov now 

## 2016-10-16 NOTE — Telephone Encounter (Signed)
Patient wanted acute visit but no openings.   Requesting Dr. Cordelia Pen nurse give a call back for a bad cough he gets every  Year that Dr. Loanne Drilling writes him a script for.  Thank you,  -LL

## 2016-10-18 DIAGNOSIS — R059 Cough, unspecified: Secondary | ICD-10-CM | POA: Insufficient documentation

## 2016-10-18 DIAGNOSIS — R05 Cough: Secondary | ICD-10-CM | POA: Insufficient documentation

## 2016-11-03 ENCOUNTER — Other Ambulatory Visit: Payer: Medicare HMO | Admitting: Orthotics

## 2016-11-16 ENCOUNTER — Telehealth: Payer: Self-pay

## 2016-11-16 NOTE — Telephone Encounter (Signed)
Patient notified via voicemail HandiCap sticker is ready for pick up.

## 2016-11-24 ENCOUNTER — Ambulatory Visit (INDEPENDENT_AMBULATORY_CARE_PROVIDER_SITE_OTHER): Payer: Self-pay | Admitting: Orthotics

## 2016-11-24 DIAGNOSIS — M722 Plantar fascial fibromatosis: Secondary | ICD-10-CM

## 2016-11-30 DIAGNOSIS — H52202 Unspecified astigmatism, left eye: Secondary | ICD-10-CM | POA: Diagnosis not present

## 2016-11-30 DIAGNOSIS — D485 Neoplasm of uncertain behavior of skin: Secondary | ICD-10-CM | POA: Diagnosis not present

## 2016-11-30 DIAGNOSIS — H35372 Puckering of macula, left eye: Secondary | ICD-10-CM | POA: Diagnosis not present

## 2016-11-30 DIAGNOSIS — H5315 Visual distortions of shape and size: Secondary | ICD-10-CM | POA: Diagnosis not present

## 2016-11-30 DIAGNOSIS — E119 Type 2 diabetes mellitus without complications: Secondary | ICD-10-CM | POA: Diagnosis not present

## 2016-11-30 LAB — HM DIABETES EYE EXAM

## 2016-12-01 ENCOUNTER — Encounter: Payer: Self-pay | Admitting: Endocrinology

## 2016-12-03 DIAGNOSIS — B078 Other viral warts: Secondary | ICD-10-CM | POA: Diagnosis not present

## 2016-12-03 DIAGNOSIS — L570 Actinic keratosis: Secondary | ICD-10-CM | POA: Diagnosis not present

## 2016-12-03 DIAGNOSIS — L700 Acne vulgaris: Secondary | ICD-10-CM | POA: Diagnosis not present

## 2016-12-03 DIAGNOSIS — L02211 Cutaneous abscess of abdominal wall: Secondary | ICD-10-CM | POA: Diagnosis not present

## 2016-12-03 DIAGNOSIS — L821 Other seborrheic keratosis: Secondary | ICD-10-CM | POA: Diagnosis not present

## 2016-12-15 ENCOUNTER — Ambulatory Visit: Payer: Medicare HMO | Admitting: Orthotics

## 2016-12-15 DIAGNOSIS — M79676 Pain in unspecified toe(s): Principal | ICD-10-CM

## 2016-12-15 DIAGNOSIS — B351 Tinea unguium: Secondary | ICD-10-CM

## 2016-12-15 NOTE — Progress Notes (Signed)
Patient came in today to pick up custom made foot orthotics.  The goals were accomplished and the patient reported no dissatisfaction with said orthotics.  Patient was advised of breakin period and how to report any issues. 

## 2016-12-18 ENCOUNTER — Ambulatory Visit (INDEPENDENT_AMBULATORY_CARE_PROVIDER_SITE_OTHER): Payer: Medicare HMO

## 2016-12-18 ENCOUNTER — Ambulatory Visit (INDEPENDENT_AMBULATORY_CARE_PROVIDER_SITE_OTHER): Payer: Medicare HMO | Admitting: Orthopaedic Surgery

## 2016-12-18 ENCOUNTER — Encounter (INDEPENDENT_AMBULATORY_CARE_PROVIDER_SITE_OTHER): Payer: Self-pay | Admitting: Orthopaedic Surgery

## 2016-12-18 VITALS — BP 149/70 | HR 55 | Ht 71.5 in | Wt 213.0 lb

## 2016-12-18 DIAGNOSIS — G8929 Other chronic pain: Secondary | ICD-10-CM

## 2016-12-18 DIAGNOSIS — M545 Low back pain: Secondary | ICD-10-CM | POA: Diagnosis not present

## 2016-12-18 DIAGNOSIS — M25571 Pain in right ankle and joints of right foot: Secondary | ICD-10-CM

## 2016-12-18 NOTE — Progress Notes (Signed)
Office Visit Note   Patient: Gerald Martinez           Date of Birth: 01-Jun-1933           MRN: [id number] Visit Date: 12/18/2016              Requested by: Renato Shin, MD [address] [place], [place] [address] PCP: Renato Shin, MD   Assessment & Plan: Visit Diagnoses:  1. Pain in right ankle and joints of right foot   2. Chronic right-sided low back pain, with sciatica presence unspecified     Plan: We'll place him in a SWEDO of help with his ankle he likely has some lateral recess or foraminal narrowing at L5-S1 on the right side. He has no motor deficit. If he gets progressive weakness or does not improve he'll call let us know. He can use a SWEDO  with ambulation  Follow-Up Instructions: Return if symptoms worsen or fail to improve.   Orders:  Orders Placed This Encounter  Procedures  . XR Ankle Complete Right  . XR Lumbar Spine 2-3 Views   No orders of the defined types were placed in this encounter.     Procedures: No procedures performed   Clinical Data: No additional findings.   Subjective: Chief Complaint  Patient presents with  . Right Leg - Pain    HPI 83 year old male long-term patient of ours has been hearing greater than 5 years and history of L4-5 surgery with eventual fusion L4-5 without instrumentation. He states he thinks he might return his ankle in the last few weeks she's had trouble walking on a treadmill [place] normally walks slowly. He said previous CABG procedure. He has some discomfort in the lateral aspect of his ankle lateral side of his foot. He said some bilateral edema in both feet. He states when he walks his leg starts getting a little bit weak. He denies associated bowel or bladder symptoms.  Review of Systems history of testicular dysfunction increased cholesterol, CAD with bypass surgery, GERD, esophageal cancer, previous disc herniation L4-5 with fusion possible autofusion, 14 point otherwise negative as it  pertains history of present illness.   Objective: Vital Signs: BP (!) 149/70   Pulse (!) 55   Ht 5' 11.5" (1.816 m)   Wt 213 lb (96.6 kg)   BMI 29.29 kg/m   Physical Exam  Constitutional: He is oriented to person, place, and time. He appears well-developed and well-nourished.  HENT:  Head: Normocephalic and atraumatic.  Eyes: Pupils are equal, round, and reactive to light. EOM are normal.  Neck: No tracheal deviation present. No thyromegaly present.  Cardiovascular: Normal rate.   Pulmonary/Chest: Effort normal. He has no wheezes.  Abdominal: Soft. Bowel sounds are normal.  Musculoskeletal:  Patient has negative straight-leg raising. 30% limitation of both hip internal and external rotation without pain. He has some erythema of both lower extremities with mild pitting edema symmetrical. Nose weakness over the peroneals gastrocsoleus anterior tib EHL. Good knee range of motion good ankle range of motion no crepitus. No sciatic notch tenderness well-healed lumbar incision.  Neurological: He is alert and oriented to person, place, and time.  Skin: Skin is warm and dry. Capillary refill takes less than 2 seconds.  Psychiatric: He has a normal mood and affect. His behavior is normal. Judgment and thought content normal.    Ortho Exam  Specialty Comments:  No specialty comments available.  Imaging: Xr Ankle Complete Right  Result Date:  12/18/2016 Three-view x-rays right ankle obtained. This shows previous vascular clips from saphenous vein harvest. No acute changes seen in the ankle. Joint spaces well maintained. Impression: Right ankle x-ray showed no acute changes.  Xr Lumbar Spine 2-3 Views  Result Date: 12/18/2016 AP lateral lumbar x-rays are obtained and reviewed this showed the old interbody fusion at L4-5. There is greater than 50% collapse at L5-S1 with spurring and facet arthropathy. L2-3 and L3-4 shows minimal narrowing. Impression: Previous L4-5 fusion. Disc degeneration  with narrowing more prominent at L5-S1    PMFS History: Patient Active Problem List   Diagnosis Date Noted  . Cough 10/18/2016  . Esophageal cancer ([place]) 08/06/2016  . Leg ulcer ([place]) 02/25/2016  . Dizziness and giddiness 05/28/2015  . Routine general medical examination at a health care facility 01/25/2014  . Allergic rhinitis 01/25/2014  . Hyponatremia 05/05/2013  . GERD (gastroesophageal reflux disease) 09/01/2011  . Encounter for long-term (current) use of other medications 01/19/2011  . Screening for prostate cancer 01/19/2011  . Overweight(278.02) 12/16/2010  . BPH (benign prostatic hyperplasia) 09/15/2010  . HYPERCHOLESTEROLEMIA 01/01/2010  . CORONARY ATHEROSCLEROSIS NATIVE CORONARY ARTERY 05/30/2009  . [place] PRESENTING HAZARDS HEALTH 05/30/2009  . RASH-NONVESICULAR 10/17/2008  . CELLULITIS AND ABSCESS OF TRUNK 09/06/2008  . Iron deficiency anemia 01/16/2008  . SHORTNESS OF BREATH 08/23/2007  . OTHER DYSPHAGIA 08/23/2007  . Testicular dysfunction 04/26/2007  . ACUTE CYSTITIS 04/19/2007  . Diabetes ([place]) 12/24/2006  . Essential hypertension 12/24/2006   Past Medical History:  Diagnosis Date  . AAA (abdominal aortic aneurysm) ([place])   . BPH (benign prostatic hypertrophy)   . CAD (coronary artery disease)    Post CABG in 2000.  LIMA to the LAD, SVG to OM, SVG to PDA, last perfusion study in 2012 with no high risk findings  . Diabetes mellitus    Type II  . Dyslipidemia   . Esophageal cancer ([place])   . Hearing loss   . Hypertension     Family History  Problem Relation Age of Onset  . Heart disease Mother   . Stroke Mother   . Cancer Father        Lymphoma  . Cancer Sister        Uncertain  . Colon cancer Neg Hx     Past Surgical History:  Procedure Laterality Date  . CORONARY ARTERY BYPASS GRAFT  2000  . ESOPHAGECTOMY  2000  . VENTRAL HERNIA REPAIR  2001   Social History   Occupational History  . Not on file.   Social History Main Topics   . Smoking status: Former Smoker    Quit date: 05/25/1997  . Smokeless tobacco: Never Used  . Alcohol use 3.6 oz/week    6 Glasses of wine per week  . Drug use: No  . Sexual activity: Not on file

## 2016-12-29 ENCOUNTER — Ambulatory Visit (INDEPENDENT_AMBULATORY_CARE_PROVIDER_SITE_OTHER): Payer: Medicare HMO

## 2016-12-29 ENCOUNTER — Ambulatory Visit (INDEPENDENT_AMBULATORY_CARE_PROVIDER_SITE_OTHER): Payer: Medicare HMO | Admitting: Orthopaedic Surgery

## 2016-12-29 ENCOUNTER — Encounter (INDEPENDENT_AMBULATORY_CARE_PROVIDER_SITE_OTHER): Payer: Self-pay | Admitting: Orthopaedic Surgery

## 2016-12-29 VITALS — BP 122/63 | HR 61

## 2016-12-29 DIAGNOSIS — G8929 Other chronic pain: Secondary | ICD-10-CM

## 2016-12-29 DIAGNOSIS — M1711 Unilateral primary osteoarthritis, right knee: Secondary | ICD-10-CM | POA: Diagnosis not present

## 2016-12-29 DIAGNOSIS — M25561 Pain in right knee: Secondary | ICD-10-CM

## 2016-12-29 MED ORDER — BUPIVACAINE HCL 0.25 % IJ SOLN
6.0000 mL | INTRAMUSCULAR | Status: AC | PRN
  Administered 2016-12-29: 6 mL via INTRA_ARTICULAR

## 2016-12-29 MED ORDER — METHYLPREDNISOLONE ACETATE 40 MG/ML IJ SUSP
40.0000 mg | INTRAMUSCULAR | Status: AC | PRN
  Administered 2016-12-29: 40 mg via INTRA_ARTICULAR

## 2016-12-29 MED ORDER — LIDOCAINE HCL 1 % IJ SOLN
3.0000 mL | INTRAMUSCULAR | Status: AC | PRN
  Administered 2016-12-29: 3 mL

## 2016-12-29 NOTE — Progress Notes (Signed)
Office Visit Note   Patient: Gerald Martinez           Date of Birth: 11-07-1933           MRN: [id number] Visit Date: 12/29/2016              Requested by: Renato Shin, MD [address] [place], [place] [address] PCP: Renato Shin, MD   Assessment & Plan: Visit Diagnoses:  1. Chronic pain of right knee   2. Arthritis of right knee     Plan:  In attempt to give patient some relief of his knee pain I offered injection. After patient consent right knee was prepped and intra-articular Marcaine/Depo-Medrol injection was performed. Advised patient that over the next couple days he was strictly monitor his blood sugars due to the potential for today's injection to cause elevation.  Follow-Up Instructions: Return in about 6 weeks (around 02/09/2017).   Orders:  Orders Placed This Encounter  Procedures  . Large Joint Injection/Arthrocentesis  . XR Knee 1-2 Views Right   No orders of the defined types were placed in this encounter.     Procedures: Large Joint Inj Date/Time: 12/29/2016 4:00 PM Performed by: Lanae Crumbly Authorized by: Lanae Crumbly   Consent Given by:  Patient Timeout: prior to procedure the correct patient, procedure, and site was verified   Indications:  Pain and joint swelling Location:  Knee Site:  R knee Needle Size:  25 G Needle Length:  1.5 inches Approach:  Anteromedial Ultrasound Guidance: No   Fluoroscopic Guidance: No   Arthrogram: No   Medications:  3 mL lidocaine 1 %; 40 mg methylPREDNISolone acetate 40 MG/ML; 6 mL bupivacaine 0.25 % Aspiration Attempted: No       Clinical Data: No additional findings.   Subjective: No chief complaint on file.   HPI Patient presents also with complaints of right knee pain. Pain increase for a couple weeks. No injury. Pain localized more in the medial joint line. Has has some swelling. Some feeling of catching but no true locking or feelings of instability. Pain increase with  ambulating, squatting. Has had injection about 3 years ago that gave pretty good relief.  Review of Systems No current cardiac pulmonary GI GU issues.  Objective: Vital Signs: BP 122/63 (BP Location: Right Arm, Patient Position: Sitting, Cuff Size: Normal)   Pulse 61   Physical Exam  Constitutional: He is oriented to person, place, and time. No distress.  HENT:  Head: Normocephalic and atraumatic.  Eyes: Pupils are equal, round, and reactive to light. EOM are normal.  Neck: Normal range of motion.  Pulmonary/Chest: No respiratory distress.  Musculoskeletal:  Gait is somewhat antalgic. Right knee range of motion about 0 120. Does have some swelling without large effusion. Medial greater than lateral joint line tenderness. Positive patellofemoral crepitus. Ligaments are stable. Calf nontender. Neurovascularly intact.  Neurological: He is alert and oriented to person, place, and time.  Skin: Skin is warm and dry.    Ortho Exam  Specialty Comments:  No specialty comments available.  Imaging: No results found.   PMFS History: Patient Active Problem List   Diagnosis Date Noted  . Cough 10/18/2016  . Esophageal cancer ([place]) 08/06/2016  . Leg ulcer ([place]) 02/25/2016  . Dizziness and giddiness 05/28/2015  . Routine general medical examination at a health care facility 01/25/2014  . Allergic rhinitis 01/25/2014  . Hyponatremia 05/05/2013  . GERD (gastroesophageal reflux disease) 09/01/2011  . Encounter for  long-term (current) use of other medications 01/19/2011  . Screening for prostate cancer 01/19/2011  . Overweight(278.02) 12/16/2010  . BPH (benign prostatic hyperplasia) 09/15/2010  . HYPERCHOLESTEROLEMIA 01/01/2010  . CORONARY ATHEROSCLEROSIS NATIVE CORONARY ARTERY 05/30/2009  . E. [place] PRESENTING HAZARDS HEALTH 05/30/2009  . RASH-NONVESICULAR 10/17/2008  . CELLULITIS AND ABSCESS OF TRUNK 09/06/2008  . Iron deficiency anemia 01/16/2008  . SHORTNESS OF BREATH  08/23/2007  . OTHER DYSPHAGIA 08/23/2007  . Testicular dysfunction 04/26/2007  . ACUTE CYSTITIS 04/19/2007  . Diabetes ([place]) 12/24/2006  . Essential hypertension 12/24/2006   Past Medical History:  Diagnosis Date  . AAA (abdominal aortic aneurysm) ([place])   . BPH (benign prostatic hypertrophy)   . CAD (coronary artery disease)    Post CABG in 2000.  LIMA to the LAD, SVG to OM, SVG to PDA, last perfusion study in 2012 with no high risk findings  . Diabetes mellitus    Type II  . Dyslipidemia   . Esophageal cancer ([place])   . Hearing loss   . Hypertension     Family History  Problem Relation Age of Onset  . Heart disease Mother   . Stroke Mother   . Cancer Father        Lymphoma  . Cancer Sister        Uncertain  . Colon cancer Neg Hx     Past Surgical History:  Procedure Laterality Date  . CORONARY ARTERY BYPASS GRAFT  2000  . ESOPHAGECTOMY  2000  . VENTRAL HERNIA REPAIR  2001   Social History   Occupational History  . Not on file.   Social History Main Topics  . Smoking status: Former Smoker    Quit date: 05/25/1997  . Smokeless tobacco: Never Used  . Alcohol use 3.6 oz/week    6 Glasses of wine per week  . Drug use: No  . Sexual activity: Not on file

## 2017-01-11 ENCOUNTER — Other Ambulatory Visit: Payer: Self-pay | Admitting: Endocrinology

## 2017-01-18 DIAGNOSIS — R69 Illness, unspecified: Secondary | ICD-10-CM | POA: Diagnosis not present

## 2017-01-22 IMAGING — MR MR HEAD W/O CM
10 of 11 series · 34 of 48 positions shown · non-contrast
Comparison: None.

CLINICAL DATA: Dizziness and lightheadedness upon awakening earlier
today. No neurologic deficits are reported.

EXAM:
MRI HEAD WITHOUT CONTRAST
TECHNIQUE: Multiplanar, multiecho pulse sequences of the brain and surrounding
structures were obtained without intravenous contrast.

[Series 3: DWI · axial · 3.0mm · 1.09mm/px · z∈[-74,+58]mm · 9 of 90 slices shown (1 of 4)]
[im 1/90]
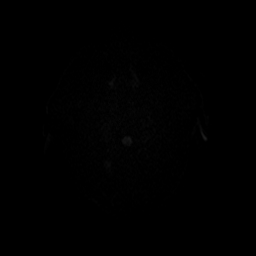
[im 12/90]
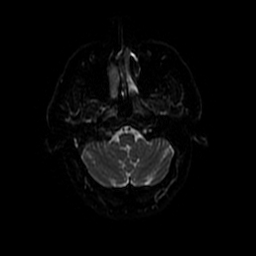
[im 23/90]
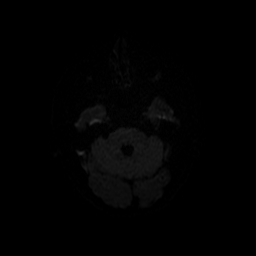
[im 34/90]
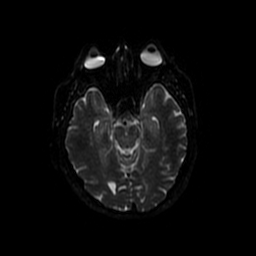
[im 45/90]
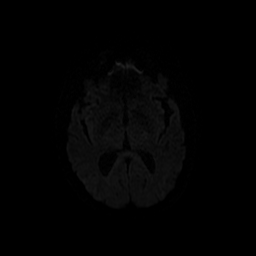
[im 56/90]
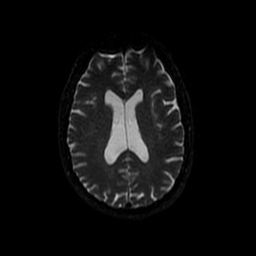
[im 67/90]
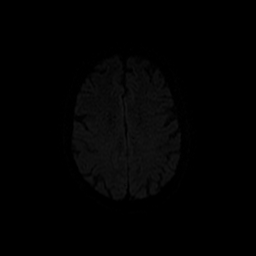
[im 78/90]
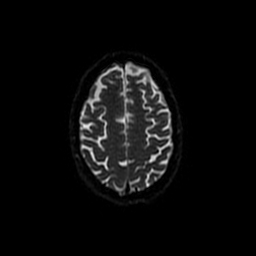
[im 90/90]
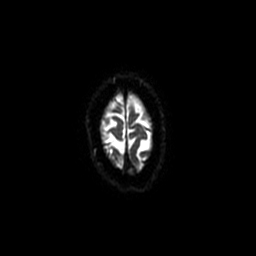

[Series 4: T1 · sagittal · 5.0mm · 0.47mm/px · 2 of 23 slices shown]
[im 1/23]
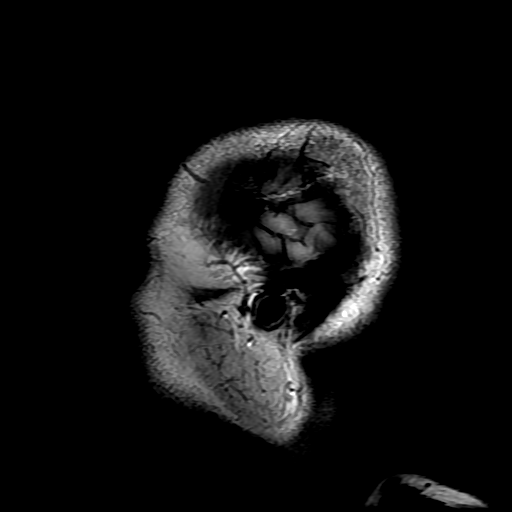
[im 23/23]
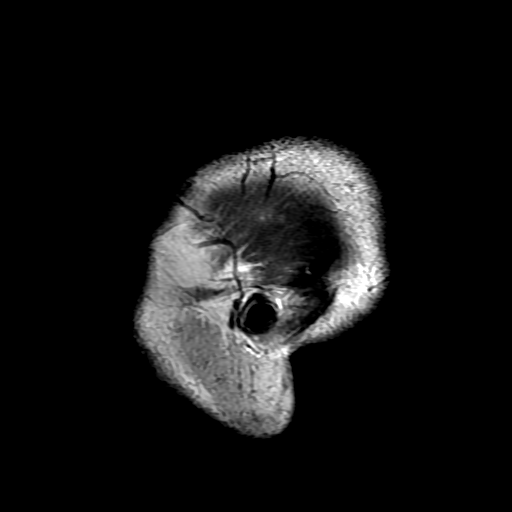

[Series 5: T2 · axial · 5.0mm · 0.43mm/px · z∈[-71,+60]mm · 2 of 23 slices shown (1 of 2)]
[im 1/23]
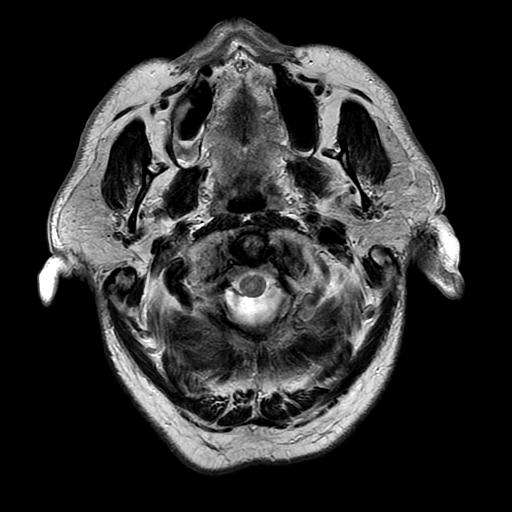
[im 23/23]
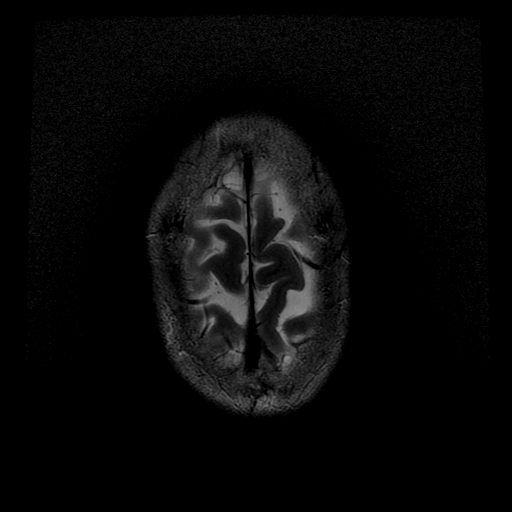

[Series 6: DWI · coronal · 5.0mm · 1.09mm/px · 6 of 66 slices shown (2 of 4)]
[im 1/66]
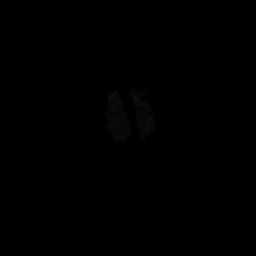
[im 14/66]
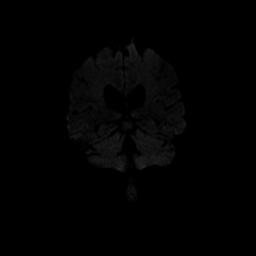
[im 27/66]
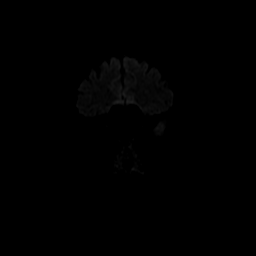
[im 40/66]
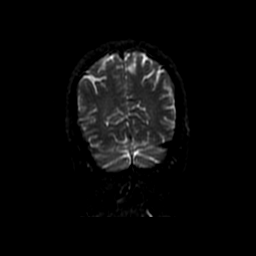
[im 53/66]
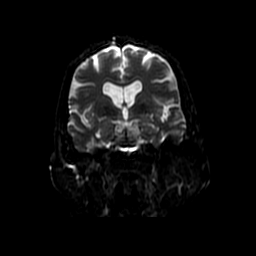
[im 66/66]
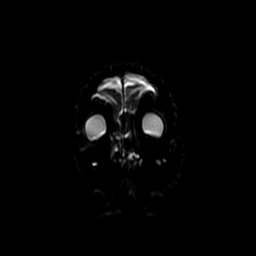

[Series 7: FLAIR · axial · 5.0mm · 0.43mm/px · z∈[-71,+60]mm · 2 of 23 slices shown]
[im 1/23]
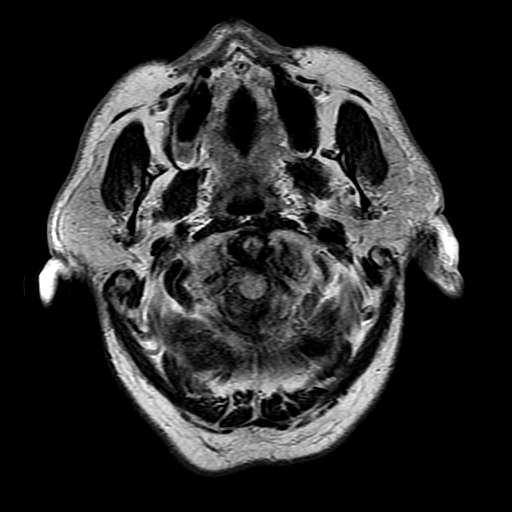
[im 23/23]
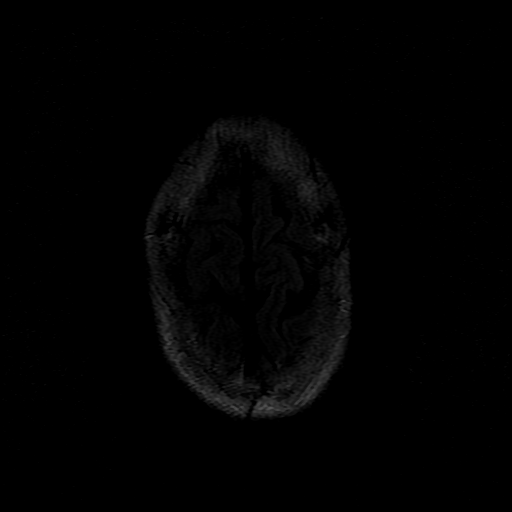

[Series 8: ax mpgr · axial · 5.0mm · 0.43mm/px · z∈[-71,+60]mm · 2 of 23 slices shown]
[im 1/23]
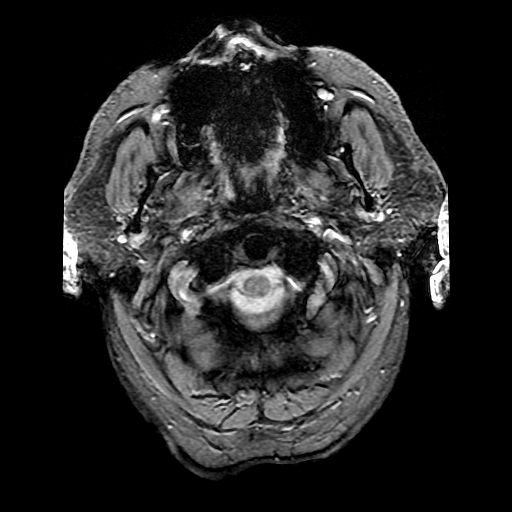
[im 23/23]
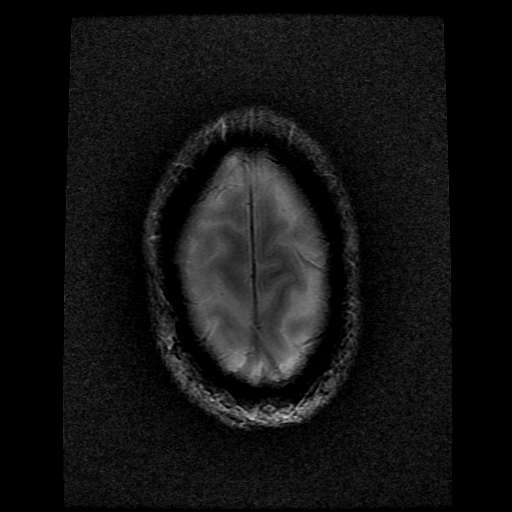

[Series 10: bSSFP · axial · 1.0mm · 0.35mm/px · 1 of 72 slices shown]
[im 1/72]
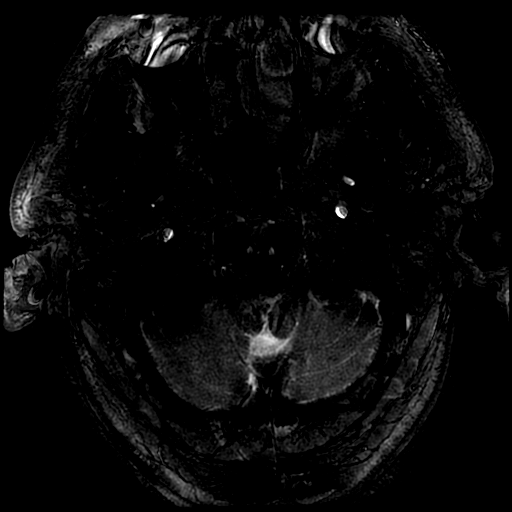

[Series 11: T2 · coronal · 4.0mm · 0.39mm/px · 3 of 36 slices shown (2 of 2)]
[im 1/36]
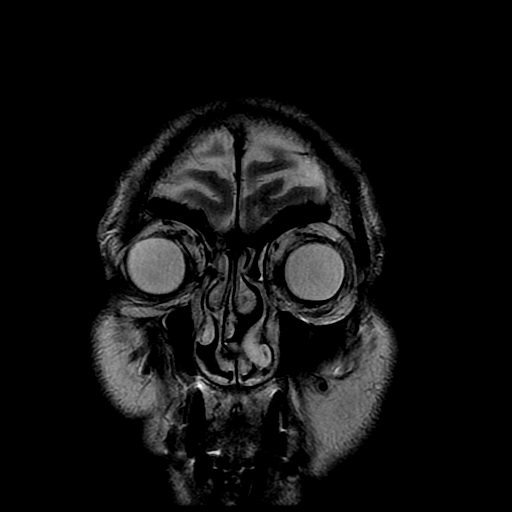
[im 18/36]
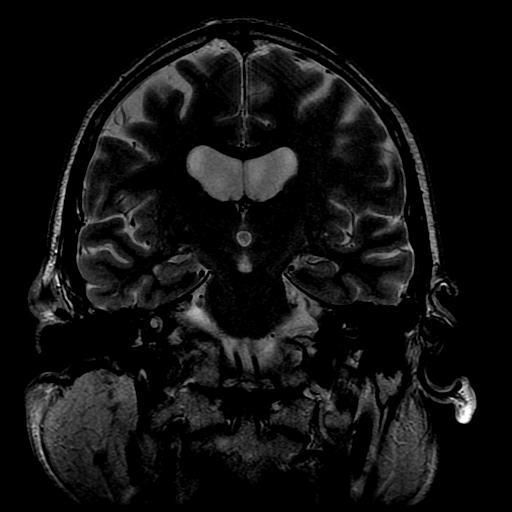
[im 36/36]
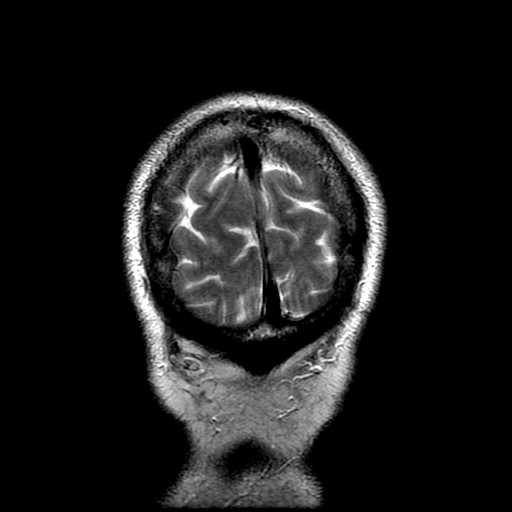

[Series 300: DWI · axial · 3.0mm · 1.09mm/px · z∈[-74,+58]mm · 4 of 45 slices shown (3 of 4)]
[im 1/45]
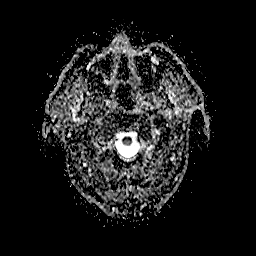
[im 15/45]
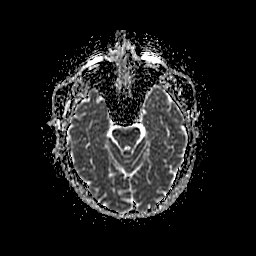
[im 30/45]
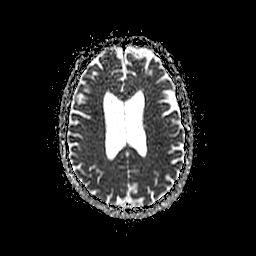
[im 45/45]
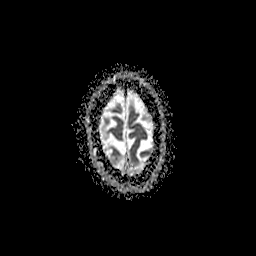

[Series 600: DWI · coronal · 5.0mm · 1.09mm/px · 3 of 33 slices shown (4 of 4)]
[im 1/33]
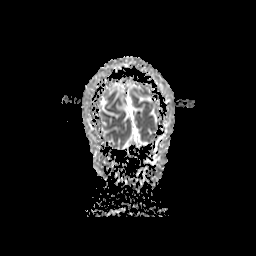
[im 17/33]
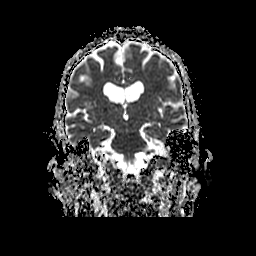
[im 33/33]
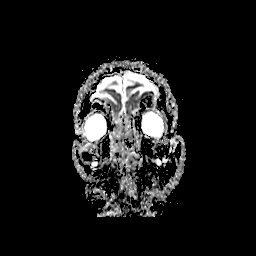

[34 of 48 positions shown; findings below may reference images not displayed]

FINDINGS: No evidence for acute infarction, hemorrhage, mass lesion,
hydrocephalus, or extra-axial fluid. Mild cerebral and cerebellar
atrophy, not unexpected for age. Minor white matter disease,
nonspecific.

Pituitary, pineal, and cerebellar tonsils unremarkable. No upper
cervical lesions.

Flow voids are maintained throughout the carotid, basilar, and
BILATERAL vertebral arteries. There are no areas of chronic
hemorrhage.

Thin-section imaging through the posterior fossa reveals no vascular
loop or mass lesion.

Extracranial soft tissues unremarkable.
IMPRESSION: No acute intracranial findings. Specifically no evidence for large
vessel occlusion or posterior fossa seen.

## 2017-02-03 ENCOUNTER — Other Ambulatory Visit: Payer: Self-pay | Admitting: Endocrinology

## 2017-02-05 ENCOUNTER — Encounter: Payer: Medicare HMO | Admitting: Endocrinology

## 2017-02-09 ENCOUNTER — Ambulatory Visit (INDEPENDENT_AMBULATORY_CARE_PROVIDER_SITE_OTHER): Payer: Medicare HMO | Admitting: Orthopaedic Surgery

## 2017-02-10 ENCOUNTER — Encounter: Payer: Self-pay | Admitting: Podiatry

## 2017-02-10 ENCOUNTER — Ambulatory Visit (INDEPENDENT_AMBULATORY_CARE_PROVIDER_SITE_OTHER): Payer: Medicare HMO | Admitting: Podiatry

## 2017-02-10 DIAGNOSIS — M79676 Pain in unspecified toe(s): Secondary | ICD-10-CM

## 2017-02-10 DIAGNOSIS — E0842 Diabetes mellitus due to underlying condition with diabetic polyneuropathy: Secondary | ICD-10-CM

## 2017-02-10 DIAGNOSIS — B351 Tinea unguium: Secondary | ICD-10-CM

## 2017-02-10 NOTE — Progress Notes (Signed)
Patient ID: Gerald Martinez, male   DOB: 02-05-1934, 83 y.o.   MRN: [id number] Complaint:  Visit Type: Patient returns to my office for long thick painful nails.  His nails are painful walking and wearing his shoes. He presents for preventative foot care services.  Podiatric Exam: Vascular: dorsalis pedis  are palpable bilateral.  Posterior tibial pulses are non palpable  B/L  Capillary return is immediate. Temperature gradient is WNL. Skin turgor WNL Venous stasis noted feet and legs  B/L Sensorium: Diminished Semmes Weinstein monofilament test. Normal tactile sensation bilaterally. Nail Exam: Pt has thick disfigured discolored nails with subungual debris noted bilateral entire nail hallux through fifth toenails.     Ulcer Exam: There is no evidence of ulcer or pre-ulcerative changes or infection. Orthopedic Exam: Muscle tone and strength are WNL. No limitations in general ROM. No crepitus or effusions noted. Foot type and digits show no abnormalities. Hammer toes  B/L. Skin: No Porokeratosis. No infection or ulcers  Diagnosis:    Diabetic neuropathy. Diabetic angiopathy  Onychomycosis  B/L  Treatment & Plan Procedures and Treatment: Debridement and grinding long thick nails.  RTC 3 months.    Return Visit-Office Procedure: Patient instructed to return to the office for follow-up when he was called by the [place] office  Gardiner Barefoot DPM

## 2017-03-09 ENCOUNTER — Ambulatory Visit (INDEPENDENT_AMBULATORY_CARE_PROVIDER_SITE_OTHER): Payer: Medicare HMO | Admitting: Endocrinology

## 2017-03-09 ENCOUNTER — Encounter: Payer: Self-pay | Admitting: Endocrinology

## 2017-03-09 VITALS — BP 138/60 | HR 65 | Wt 217.2 lb

## 2017-03-09 DIAGNOSIS — E299 Testicular dysfunction, unspecified: Secondary | ICD-10-CM | POA: Diagnosis not present

## 2017-03-09 DIAGNOSIS — Z23 Encounter for immunization: Secondary | ICD-10-CM | POA: Diagnosis not present

## 2017-03-09 DIAGNOSIS — E119 Type 2 diabetes mellitus without complications: Secondary | ICD-10-CM | POA: Diagnosis not present

## 2017-03-09 DIAGNOSIS — Z125 Encounter for screening for malignant neoplasm of prostate: Secondary | ICD-10-CM

## 2017-03-09 DIAGNOSIS — Z0000 Encounter for general adult medical examination without abnormal findings: Secondary | ICD-10-CM

## 2017-03-09 DIAGNOSIS — E1159 Type 2 diabetes mellitus with other circulatory complications: Secondary | ICD-10-CM | POA: Diagnosis not present

## 2017-03-09 LAB — CBC WITH DIFFERENTIAL/PLATELET
BASOS PCT: 0.6 % (ref 0.0–3.0)
Basophils Absolute: 0 10*3/uL (ref 0.0–0.1)
Eosinophils Absolute: 0.1 10*3/uL (ref 0.0–0.7)
Eosinophils Relative: 1.4 % (ref 0.0–5.0)
HEMATOCRIT: 43.9 % (ref 39.0–52.0)
Hemoglobin: 14.2 g/dL (ref 13.0–17.0)
LYMPHS PCT: 23.2 % (ref 12.0–46.0)
Lymphs Abs: 1.2 10*3/uL (ref 0.7–4.0)
MCHC: 32.3 g/dL (ref 30.0–36.0)
MCV: 83.9 fl (ref 78.0–100.0)
MONOS PCT: 10.6 % (ref 3.0–12.0)
Monocytes Absolute: 0.6 10*3/uL (ref 0.1–1.0)
NEUTROS ABS: 3.4 10*3/uL (ref 1.4–7.7)
Neutrophils Relative %: 64.2 % (ref 43.0–77.0)
PLATELETS: 190 10*3/uL (ref 150.0–400.0)
RBC: 5.23 Mil/uL (ref 4.22–5.81)
RDW: 17.6 % — AB (ref 11.5–15.5)
WBC: 5.4 10*3/uL (ref 4.0–10.5)

## 2017-03-09 LAB — LIPID PANEL
CHOL/HDL RATIO: 3
Cholesterol: 137 mg/dL (ref 0–200)
HDL: 43 mg/dL (ref >39.00)
LDL CALC: 63 mg/dL (ref 0–99)
NONHDL: 93.78
Triglycerides: 153 mg/dL — ABNORMAL HIGH (ref 0.0–149.0)
VLDL: 30.6 mg/dL (ref 0.0–40.0)

## 2017-03-09 LAB — URINALYSIS, ROUTINE W REFLEX MICROSCOPIC
BILIRUBIN URINE: NEGATIVE
Hgb urine dipstick: NEGATIVE
Ketones, ur: NEGATIVE
LEUKOCYTES UA: NEGATIVE
Nitrite: NEGATIVE
PH: 8 (ref 5.0–8.0)
RBC / HPF: NONE SEEN (ref 0–?)
SPECIFIC GRAVITY, URINE: 1.01 (ref 1.000–1.030)
Total Protein, Urine: NEGATIVE
Urine Glucose: NEGATIVE
Urobilinogen, UA: 0.2 (ref 0.0–1.0)
WBC, UA: NONE SEEN (ref 0–?)

## 2017-03-09 LAB — POCT GLYCOSYLATED HEMOGLOBIN (HGB A1C): Hemoglobin A1C: 6.4

## 2017-03-09 LAB — TSH: TSH: 2.14 u[IU]/mL (ref 0.35–4.50)

## 2017-03-09 LAB — HEPATIC FUNCTION PANEL
ALT: 26 U/L (ref 0–53)
AST: 25 U/L (ref 0–37)
Albumin: 4 g/dL (ref 3.5–5.2)
Alkaline Phosphatase: 57 U/L (ref 39–117)
BILIRUBIN DIRECT: 0.1 mg/dL (ref 0.0–0.3)
BILIRUBIN TOTAL: 0.5 mg/dL (ref 0.2–1.2)
Total Protein: 6.6 g/dL (ref 6.0–8.3)

## 2017-03-09 LAB — BASIC METABOLIC PANEL
BUN: 13 mg/dL (ref 6–23)
CALCIUM: 9.1 mg/dL (ref 8.4–10.5)
CO2: 30 meq/L (ref 19–32)
CREATININE: 0.9 mg/dL (ref 0.40–1.50)
Chloride: 100 mEq/L (ref 96–112)
GFR: 86.09 mL/min (ref >60.00)
GLUCOSE: 102 mg/dL — AB (ref 70–99)
Potassium: 4.4 mEq/L (ref 3.5–5.1)
SODIUM: 135 meq/L (ref 135–145)

## 2017-03-09 LAB — PSA: PSA: 2.57 ng/mL (ref 0.10–4.00)

## 2017-03-09 NOTE — Progress Notes (Addendum)
Subjective:    Patient ID: Gerald Martinez, male    DOB: 1933/07/02, 83 y.o.   MRN: [id number]  HPI Pt is here for regular wellness examination, and is feeling pretty well in general, and says chronic med probs are stable, except as noted below.   Past Medical History:  Diagnosis Date  . AAA (abdominal aortic aneurysm) ([place])   . BPH (benign prostatic hypertrophy)   . CAD (coronary artery disease)    Post CABG in 2000.  LIMA to the LAD, SVG to OM, SVG to PDA, last perfusion study in 2012 with no high risk findings  . Diabetes mellitus    Type II  . Dyslipidemia   . Esophageal cancer ([place])   . Hearing loss   . Hypertension     Past Surgical History:  Procedure Laterality Date  . CORONARY ARTERY BYPASS GRAFT  2000  . ESOPHAGECTOMY  2000  . VENTRAL HERNIA REPAIR  2001    Social History   Social History  . Marital status: Married    Spouse name: N/A  . Number of children: N/A  . Years of education: N/A   Occupational History  . Not on file.   Social History Main Topics  . Smoking status: Former Smoker    Quit date: 05/25/1997  . Smokeless tobacco: Never Used  . Alcohol use 3.6 oz/week    6 Glasses of wine per week  . Drug use: No  . Sexual activity: Not on file   Other Topics Concern  . Not on file   Social History Narrative  . No narrative on file    Current Outpatient Prescriptions on File Prior to Visit  Medication Sig Dispense Refill  . aspirin 325 MG EC tablet Take 325 mg by mouth daily.      Marland Kitchen doxazosin (CARDURA) 4 MG tablet TAKE 1 TABLET BY MOUTH DAILY. 90 tablet 3  . FREESTYLE LITE test strip USE AS INSTRUCTED ONCE DAILY 90 each 3  . furosemide (LASIX) 20 MG tablet TAKE 1 TABLET BY MOUTH DAILY. 90 tablet 1  . gabapentin (NEURONTIN) 100 MG capsule Take 1 capsule (100 mg total) by mouth 3 (three) times daily. 90 capsule 3  . Lancets (FREESTYLE) lancets USE AS DIRECTED DAILY. 100 each PRN  . LIDODERM 5 % PLACE 1 PATCH ONTO THE SKIN DAILY. REMOVE & DISCARD  PATCH WITHIN 12 HOURS OR AS DIRECTED BY MD 30 patch 11  . losartan (COZAAR) 50 MG tablet TAKE 1 TABLET BY MOUTH ONCE DAILY. 90 tablet PRN  . Melatonin 10 MG TABS Take 1 tablet by mouth as needed.    . metFORMIN (GLUCOPHAGE-XR) 500 MG 24 hr tablet TAKE 1 TABLET BY MOUTH DAILY WITH BREAKFAST. 90 tablet 3  . Multiple Vitamin (MULTIVITAMIN) tablet Take 1 tablet by mouth daily.      Marland Kitchen omeprazole (PRILOSEC) 20 MG capsule TAKE 2 CAPSULES (40 MG TOTAL) BY MOUTH DAILY. (Patient taking differently: TAKE 1 CAPSULE (20 MG TOTAL) BY MOUTH DAILY.) 180 capsule 12  . simvastatin (ZOCOR) 40 MG tablet TAKE 1 TABLET BY MOUTH AT BEDTIME. 90 tablet 3  . testosterone (TESTIM) 50 MG/5GM (1%) GEL APPLY THE CONTENTS OF 2 TUBES ONTO THE SKIN DAILY. 300 g 5  . traZODone (DESYREL) 100 MG tablet Take 1 tablet (100 mg total) by mouth at bedtime as needed for sleep. 30 tablet 3   No current facility-administered medications on file prior to visit.     Allergies  Allergen Reactions  .  Propoxyphene N-Acetaminophen     Family History  Problem Relation Age of Onset  . Heart disease Mother   . Stroke Mother   . Cancer Father        Lymphoma  . Cancer Sister        Uncertain  . Colon cancer Neg Hx     BP 138/60   Pulse 65   Wt 217 lb 3.2 oz (98.5 kg)   SpO2 94%   BMI 29.87 kg/m    Review of Systems Denies fever, fatigue, tearing of the eyes, otalgia, chest pain, depression, cold intolerance, BRBPR, hematuria, syncope, numbness, allergy sxs, easy bruising, and rash.  No change in chronic doe or back pain.     Objective:   Physical Exam VS: see vs page GEN: no distress HEAD: head: no deformity eyes: no periorbital swelling, no proptosis external nose and ears are normal mouth: no lesion seen NECK: supple, thyroid is not enlarged CHEST WALL: no deformity.  Old healed surgical scar (median sternotomy) LUNGS: clear to auscultation BREASTS:  No gynecomastia CV: reg rate and rhythm, no murmur ABD: abdomen  is soft, nontender.  no hepatosplenomegaly.  not distended.  no hernia.   RECTAL: normal external and internal exam.  heme neg. PROSTATE:  Normal size.  No nodule MUSCULOSKELETAL: muscle bulk and strength are grossly normal.  no obvious joint swelling.  gait is normal wiith a cane. EXTEMITIES: no deformity.  no ulcer on the feet.  feet are of normal color and temp.  1+ bilat leg edema.  There is bilateral onychomycosis of the toenails PULSES: dorsalis pedis intact bilat.  no carotid bruit NEURO:  cn 2-12 grossly intact.   readily moves all 4's.  sensation is intact to touch on the feet, but decreased from normal SKIN:  Normal texture and temperature.  No rash or suspicious lesion is visible.  Old healed surgical scars (bilat vein harvest). NODES:  None palpable at the neck PSYCH: alert, well-oriented.  Does not appear anxious nor depressed.    We can't do ecg, as we are out of electrodes.  Lab Results  Component Value Date   HGBA1C 6.4 03/09/2017      Assessment & Plan:  Wellness visit today, with problems stable, except as noted. Type 2 DM: a1c is well-controlled.  Please continue the same medication.  Subjective:   Patient here for Medicare annual wellness visit and management of other chronic and acute problems.     Risk factors: advanced age    34 of Physicians Providing Medical Care to Patient:  See "snapshot"   Activities of Daily Living: In your present state of health, do you have any difficulty performing the following activities (lives with wife)?:  Preparing food and eating?: No  Bathing yourself: No  Getting dressed: No  Using the toilet:No  Moving around from place to place: No  In the past year have you fallen or had a near fall?:No    Home Safety: Has smoke detector and wears seat belts. firearms are safely stored. No excess sun exposure.    Opioid : none   Diet and Exercise  Current exercise habits: pt says good Dietary issues discussed: pt reports a  healthy diet   Depression Screen  Q1: Over the past two weeks, have you felt down, depressed or hopeless? no  Q2: Over the past two weeks, have you felt little interest or pleasure in doing things? no   The following portions of the patient's history were reviewed and  updated as appropriate: allergies, current medications, past family history, past medical history, past social history, past surgical history and problem list.   Review of Systems  Denies hearing loss, and visual loss Objective:   Vision:  Advertising account executive, so he declines VA today.  Hearing: grossly normal Body mass index:  See vs page Msk: pt easily and quickly performs "get-up-and-go" from a sitting position.  Cognitive Impairment Assessment: cognition, memory and judgment appear normal.  remembers 3/3 at 5 minutes.  excellent recall.  can easily read and write a sentence.  alert and oriented x 3   Assessment:   Medicare wellness utd on preventive parameters    Plan:   During the course of the visit the patient was educated and counseled about appropriate screening and preventive services including:        Fall prevention is advised today  Screening mammography  Bone densitometry screening  Diabetes screening  Nutrition counseling is offered   Vaccines are updated as needed  Patient Instructions (the written plan) was given to the patient.

## 2017-03-09 NOTE — Progress Notes (Signed)
we discussed code status.  pt requests full code, but would not want to be started or maintained on artificial life-support measures if there was not a reasonable chance of recovery 

## 2017-03-09 NOTE — Patient Instructions (Addendum)
Please consider these measures for your health:  minimize alcohol.  Do not use tobacco products.  Have a colonoscopy at least every 10 years from age 83.  Keep firearms safely stored.  Always use seat belts.  have working smoke alarms in your home.  See an eye doctor and dentist regularly.  Never drive under the influence of alcohol or drugs (including prescription drugs).  Those with fair skin should take precautions against the sun, and should carefully examine their skin once per month, for any new or changed moles. It is critically important to prevent falling down (keep floor areas well-lit, dry, and free of loose objects.  If you have a cane, walker, or wheelchair, you should use it, even for short trips around the house.  Wear flat-soled shoes.  Also, try not to rush). blood tests are requested for you today.  We'll let you know about the results.   Please come back for a follow-up appointment in 6 months.

## 2017-03-10 LAB — MICROALBUMIN / CREATININE URINE RATIO
Creatinine,U: 61.5 mg/dL
MICROALB UR: 1.8 mg/dL (ref 0.0–1.9)
Microalb Creat Ratio: 2.9 mg/g (ref 0.0–30.0)

## 2017-03-10 LAB — TESTOSTERONE,FREE AND TOTAL
TESTOSTERONE FREE: 10.4 pg/mL (ref 6.6–18.1)
TESTOSTERONE: 430 ng/dL (ref 264–916)

## 2017-03-12 ENCOUNTER — Other Ambulatory Visit: Payer: Self-pay | Admitting: Endocrinology

## 2017-03-15 ENCOUNTER — Other Ambulatory Visit: Payer: Self-pay | Admitting: Endocrinology

## 2017-03-16 NOTE — Progress Notes (Signed)
HPI Patient presents for followup of his known coronary disease. Since I last saw him he has had no new cardiac complaints.  His biggest issue is balance.  He has had some near falls.  He is using a cain at times.  The patient denies any new symptoms such as chest discomfort, neck or arm discomfort. There has been no new shortness of breath, PND or orthopnea. There have been no reported palpitations, presyncope or syncope.   Allergies  Allergen Reactions  . Propoxyphene N-Acetaminophen     Current Outpatient Prescriptions  Medication Sig Dispense Refill  . aspirin EC 81 MG tablet Take 81 mg by mouth daily.    Marland Kitchen doxazosin (CARDURA) 4 MG tablet TAKE 1 TABLET BY MOUTH DAILY. 90 tablet 3  . FREESTYLE LITE test strip USE AS INSTRUCTED ONCE DAILY 90 each 3  . furosemide (LASIX) 20 MG tablet TAKE 1 TABLET BY MOUTH DAILY. 90 tablet 1  . gabapentin (NEURONTIN) 100 MG capsule Take 1 capsule (100 mg total) by mouth 3 (three) times daily. 90 capsule 3  . Lancets (FREESTYLE) lancets USE AS DIRECTED DAILY. 100 each PRN  . LIDODERM 5 % PLACE 1 PATCH ONTO THE SKIN DAILY. REMOVE & DISCARD PATCH WITHIN 12 HOURS OR AS DIRECTED BY MD 30 patch 11  . losartan (COZAAR) 50 MG tablet TAKE 1 TABLET BY MOUTH ONCE DAILY. 90 tablet PRN  . Melatonin 10 MG TABS Take 1 tablet by mouth as needed.    . metFORMIN (GLUCOPHAGE-XR) 500 MG 24 hr tablet TAKE 1 TABLET BY MOUTH DAILY WITH BREAKFAST. 90 tablet 4  . Multiple Vitamin (MULTIVITAMIN) tablet Take 1 tablet by mouth daily.      Marland Kitchen omeprazole (PRILOSEC) 20 MG capsule TAKE 2 CAPSULES (40 MG TOTAL) BY MOUTH DAILY. (Patient taking differently: TAKE 1 CAPSULE (20 MG TOTAL) BY MOUTH DAILY.) 180 capsule 12  . simvastatin (ZOCOR) 40 MG tablet TAKE 1 TABLET BY MOUTH AT BEDTIME. 90 tablet 3  . TESTIM 50 MG/5GM (1%) GEL APPLY THE CONTENTS OF 2 TUBES ONTO THE SKIN DAILY. 300 g 5  . traZODone (DESYREL) 100 MG tablet Take 1 tablet (100 mg total) by mouth at bedtime as needed for  sleep. 30 tablet 3   No current facility-administered medications for this visit.     Past Medical History:  Diagnosis Date  . AAA (abdominal aortic aneurysm) ([place])   . BPH (benign prostatic hypertrophy)   . CAD (coronary artery disease)    Post CABG in 2000.  LIMA to the LAD, SVG to OM, SVG to PDA, last perfusion study in 2012 with no high risk findings  . Diabetes mellitus    Type II  . Dyslipidemia   . Esophageal cancer ([place])   . Hearing loss   . Hypertension     Past Surgical History:  Procedure Laterality Date  . CORONARY ARTERY BYPASS GRAFT  2000  . ESOPHAGECTOMY  2000  . VENTRAL HERNIA REPAIR  2001    ROS:   As stated in the HPI and negative for all other systems.  PHYSICAL EXAM BP 128/66   Pulse (!) 58   Ht 5\' 11"  (1.803 m)   Wt 216 lb 12.8 oz (98.3 kg)   BMI 30.24 kg/m   GENERAL:  Well appearing NECK:  No jugular venous distention, waveform within normal limits, carotid upstroke brisk and symmetric, no bruits, no thyromegaly LUNGS:  Clear to auscultation bilaterally CHEST:  Well healed sternotomy scar. HEART:  PMI not displaced  or sustained,S1 and S2 within normal limits, no S3, no S4, no clicks, no rubs, no murmurs ABD:  Flat, positive bowel sounds normal in frequency in pitch, no bruits, no rebound, no guarding, no midline pulsatile mass, no hepatomegaly, no splenomegaly EXT:  2 plus pulses throughout, no edema, no cyanosis no clubbing   EKG:  Sinus rhythm, rate 58 , RAD,  intervals within normal limits, no acute ST-T wave changes.  03/17/2017  Lab Results  Component Value Date   CHOL 137 03/09/2017   TRIG 153.0 (H) 03/09/2017   HDL 43.00 03/09/2017   LDLCALC 63 03/09/2017   Lab Results  Component Value Date   HGBA1C 6.4 03/09/2017    ASSESSMENT AND PLAN  CORONARY ATHEROSCLEROSIS NATIVE CORONARY ARTERY - The patient has no new sypmtoms.  No further cardiovascular testing is indicated.  We will continue with aggressive risk reduction and meds as  listed.  He had stress testing in 2012.    HYPERTENSION -  The blood pressure is at target.  No change in therapy is planned.   HYPERCHOLESTEROLEMIA -  His lipids were excellent a couple of weeks ago.  No change in therapy.   Overweight -  We have discussed weight loss strategies.   AAA - He had stable repair 3 years ago.   BALANCE - I will get a PT consult.

## 2017-03-17 ENCOUNTER — Encounter: Payer: Self-pay | Admitting: Cardiology

## 2017-03-17 ENCOUNTER — Ambulatory Visit (INDEPENDENT_AMBULATORY_CARE_PROVIDER_SITE_OTHER): Payer: Medicare HMO | Admitting: Cardiology

## 2017-03-17 VITALS — BP 128/66 | HR 58 | Ht 71.0 in | Wt 216.8 lb

## 2017-03-17 DIAGNOSIS — I2510 Atherosclerotic heart disease of native coronary artery without angina pectoris: Secondary | ICD-10-CM

## 2017-03-17 DIAGNOSIS — I10 Essential (primary) hypertension: Secondary | ICD-10-CM | POA: Diagnosis not present

## 2017-03-17 DIAGNOSIS — R2689 Other abnormalities of gait and mobility: Secondary | ICD-10-CM | POA: Insufficient documentation

## 2017-03-17 DIAGNOSIS — E785 Hyperlipidemia, unspecified: Secondary | ICD-10-CM | POA: Diagnosis not present

## 2017-03-17 NOTE — Patient Instructions (Signed)
Medication Instructions:  Continue current medication  If you need a refill on your cardiac medications before your next appointment, please call your pharmacy.  Labwork: None Ordered   Testing/Procedures: None Ordered  Follow-Up: You have been referred to Physical Therapy  Your physician wants you to follow-up in: 1 Year. You should receive a reminder letter in the mail two months in advance. If you do not receive a letter, please call our office [phone].   Thank you for choosing [facility] at [facility]!!

## 2017-03-22 NOTE — Addendum Note (Signed)
Addended by: Renato Shin on: 03/22/2017 07:21 PM   Modules accepted: Orders

## 2017-03-25 ENCOUNTER — Encounter: Payer: Self-pay | Admitting: Endocrinology

## 2017-03-28 NOTE — Addendum Note (Signed)
Addended by: Renato Shin on: 03/28/2017 02:52 PM   Modules accepted: Orders

## 2017-03-29 ENCOUNTER — Other Ambulatory Visit: Payer: Self-pay | Admitting: Endocrinology

## 2017-04-24 ENCOUNTER — Other Ambulatory Visit: Payer: Self-pay | Admitting: Endocrinology

## 2017-04-29 ENCOUNTER — Telehealth: Payer: Self-pay | Admitting: Endocrinology

## 2017-04-29 NOTE — Telephone Encounter (Signed)
Pharmacist rep representing Medicare Part D called to verify currrent Active list of medication pt is taking. She states she will call back to speak to clinical to verify

## 2017-05-08 ENCOUNTER — Other Ambulatory Visit: Payer: Self-pay | Admitting: Endocrinology

## 2017-05-08 NOTE — Telephone Encounter (Signed)
Please refill the other 2, but please decline the cough syrup.

## 2017-05-11 NOTE — Telephone Encounter (Signed)
Please decline the cough syrup, but refill the other 2 PRN.

## 2017-05-11 NOTE — Telephone Encounter (Signed)
Ok to refill 

## 2017-05-12 ENCOUNTER — Ambulatory Visit (INDEPENDENT_AMBULATORY_CARE_PROVIDER_SITE_OTHER): Payer: Medicare HMO | Admitting: Podiatry

## 2017-05-12 ENCOUNTER — Encounter: Payer: Self-pay | Admitting: Podiatry

## 2017-05-12 DIAGNOSIS — M2042 Other hammer toe(s) (acquired), left foot: Secondary | ICD-10-CM

## 2017-05-12 DIAGNOSIS — E0842 Diabetes mellitus due to underlying condition with diabetic polyneuropathy: Secondary | ICD-10-CM

## 2017-05-12 DIAGNOSIS — R2681 Unsteadiness on feet: Secondary | ICD-10-CM

## 2017-05-12 DIAGNOSIS — M2041 Other hammer toe(s) (acquired), right foot: Secondary | ICD-10-CM

## 2017-05-12 DIAGNOSIS — B351 Tinea unguium: Secondary | ICD-10-CM

## 2017-05-12 DIAGNOSIS — R2689 Other abnormalities of gait and mobility: Secondary | ICD-10-CM

## 2017-05-12 DIAGNOSIS — M79676 Pain in unspecified toe(s): Secondary | ICD-10-CM

## 2017-05-12 NOTE — Progress Notes (Signed)
Patient ID: Gerald Martinez, male   DOB: 07-06-1933, 83 y.o.   MRN: [id number] Complaint:  Visit Type: Patient returns to my office for long thick painful nails.  His nails are painful walking and wearing his shoes.  He says he is walking with his diabetic insoles.  He says he has balance issues today and we are planning to transport him to his car in a wheelchair.He presents for preventative foot care services.  Podiatric Exam: Vascular: dorsalis pedis  are palpable bilateral.  Posterior tibial pulses are non palpable  B/L  Capillary return is immediate. Temperature gradient is WNL. Skin turgor WNL Venous stasis noted feet and legs  B/L Sensorium: Diminished Semmes Weinstein monofilament test. Normal tactile sensation bilaterally. Nail Exam: Pt has thick disfigured discolored nails with subungual debris noted bilateral entire nail hallux through fifth toenails.     Ulcer Exam: There is no evidence of ulcer or pre-ulcerative changes or infection. Orthopedic Exam: Muscle tone and strength are WNL. No limitations in general ROM. No crepitus or effusions noted. Foot type and digits show no abnormalities. Hammer toes  B/L. Skin: No Porokeratosis. No infection or ulcers  Diagnosis:    Diabetic neuropathy. Diabetic angiopathy  Onychomycosis  B/L.  Gait instability.  Treatment & Plan Procedures and Treatment: Debridement and grinding long thick nails.  RTC 3 months. Patient was told to present to the office in 3 months for continued foot care.  He was also told to make an appointment with Gerald Martinez for Gerald Martinez balance braces.  Patient is also a candidate for diabetic shoes      Gardiner Barefoot DPM

## 2017-05-13 ENCOUNTER — Other Ambulatory Visit: Payer: Medicare HMO | Admitting: Orthotics

## 2017-05-14 ENCOUNTER — Other Ambulatory Visit: Payer: Self-pay | Admitting: Endocrinology

## 2017-05-14 NOTE — Telephone Encounter (Signed)
I am unaware of any need for this med

## 2017-05-28 ENCOUNTER — Telehealth: Payer: Self-pay | Admitting: Endocrinology

## 2017-05-28 ENCOUNTER — Encounter: Payer: Self-pay | Admitting: Endocrinology

## 2017-05-28 DIAGNOSIS — M545 Low back pain, unspecified: Secondary | ICD-10-CM

## 2017-05-28 DIAGNOSIS — G8929 Other chronic pain: Secondary | ICD-10-CM

## 2017-05-28 NOTE — Telephone Encounter (Signed)
Patient stated that insurance will not pay for testium or the  Lidoderm patches, he is asking Dr Loanne Drilling to appeal this for him, because he need this, or can he find another alternative.

## 2017-06-01 ENCOUNTER — Encounter: Payer: Self-pay | Admitting: Endocrinology

## 2017-06-01 ENCOUNTER — Telehealth: Payer: Self-pay | Admitting: Endocrinology

## 2017-06-01 DIAGNOSIS — M545 Low back pain, unspecified: Secondary | ICD-10-CM | POA: Insufficient documentation

## 2017-06-01 NOTE — Telephone Encounter (Signed)
I called and left patient a message on answering machine to call back to tell us what he is using Lidoderm patch for so I could proceed with PA.

## 2017-06-01 NOTE — Telephone Encounter (Signed)
Patient needs PA for both Lidoderm patches at well at [facility]. He needs the patches for the lower back pain & can barely bend over with out them. Then the testium for the hormones so he will have energy. He stated that he barely has any with out it. If I have ICD 10 codes for both I can call & initiated PA's.

## 2017-06-01 NOTE — Telephone Encounter (Signed)
Testicular dysfunction is dx for testim.  I added dx of low back pain.  Thank you.

## 2017-06-02 ENCOUNTER — Telehealth: Payer: Self-pay | Admitting: Endocrinology

## 2017-06-02 NOTE — Telephone Encounter (Signed)
error 

## 2017-06-02 NOTE — Telephone Encounter (Signed)
Gerald Martinez with [facility] called re: is faxing information to our office to be filled out and faxed back to [employer]-no ph# available

## 2017-06-04 ENCOUNTER — Other Ambulatory Visit: Payer: Self-pay

## 2017-06-04 MED ORDER — TESTOSTERONE 50 MG/5GM (1%) TD GEL: TRANSDERMAL | 5 refills | Status: DC

## 2017-06-04 NOTE — Telephone Encounter (Signed)
I was able to get testim approved for patient but not the lidoderm patch. We will get paperwork & can appeal the denial. Would you like to send in the testim or have me print? Also his insurance said that duloxetine would be a tier II if you & patient wanted to try this instead. Otherwise just regular Nsaids for a diagnosis of lower back pain.

## 2017-06-04 NOTE — Telephone Encounter (Signed)
I have sent a prescription to your pharmacy, to refill the testim. As for the lidocaine, please try lidocaine gel.  You could buy OTC, or I could send rx.

## 2017-06-07 ENCOUNTER — Other Ambulatory Visit: Payer: Self-pay | Admitting: Endocrinology

## 2017-06-08 ENCOUNTER — Other Ambulatory Visit: Payer: Medicare HMO

## 2017-06-08 DIAGNOSIS — M2041 Other hammer toe(s) (acquired), right foot: Secondary | ICD-10-CM | POA: Diagnosis not present

## 2017-06-08 DIAGNOSIS — E0842 Diabetes mellitus due to underlying condition with diabetic polyneuropathy: Secondary | ICD-10-CM | POA: Diagnosis not present

## 2017-06-08 DIAGNOSIS — R2689 Other abnormalities of gait and mobility: Secondary | ICD-10-CM | POA: Diagnosis not present

## 2017-06-08 DIAGNOSIS — R2681 Unsteadiness on feet: Secondary | ICD-10-CM | POA: Diagnosis not present

## 2017-06-08 DIAGNOSIS — M2042 Other hammer toe(s) (acquired), left foot: Secondary | ICD-10-CM | POA: Diagnosis not present

## 2017-06-15 DIAGNOSIS — H7291 Unspecified perforation of tympanic membrane, right ear: Secondary | ICD-10-CM | POA: Diagnosis not present

## 2017-06-15 DIAGNOSIS — H6123 Impacted cerumen, bilateral: Secondary | ICD-10-CM | POA: Diagnosis not present

## 2017-06-15 DIAGNOSIS — H906 Mixed conductive and sensorineural hearing loss, bilateral: Secondary | ICD-10-CM | POA: Insufficient documentation

## 2017-07-28 DIAGNOSIS — D485 Neoplasm of uncertain behavior of skin: Secondary | ICD-10-CM | POA: Diagnosis not present

## 2017-07-28 DIAGNOSIS — L570 Actinic keratosis: Secondary | ICD-10-CM | POA: Diagnosis not present

## 2017-07-28 DIAGNOSIS — L821 Other seborrheic keratosis: Secondary | ICD-10-CM | POA: Diagnosis not present

## 2017-07-28 DIAGNOSIS — L820 Inflamed seborrheic keratosis: Secondary | ICD-10-CM | POA: Diagnosis not present

## 2017-08-11 ENCOUNTER — Encounter: Payer: Self-pay | Admitting: Podiatry

## 2017-08-11 ENCOUNTER — Ambulatory Visit (INDEPENDENT_AMBULATORY_CARE_PROVIDER_SITE_OTHER): Payer: Medicare HMO | Admitting: Podiatry

## 2017-08-11 DIAGNOSIS — E0842 Diabetes mellitus due to underlying condition with diabetic polyneuropathy: Secondary | ICD-10-CM | POA: Diagnosis not present

## 2017-08-11 DIAGNOSIS — M79676 Pain in unspecified toe(s): Secondary | ICD-10-CM

## 2017-08-11 DIAGNOSIS — B351 Tinea unguium: Secondary | ICD-10-CM

## 2017-08-11 NOTE — Progress Notes (Signed)
Patient ID: Gerald Martinez, male   DOB: Sep 27, 1933, 84 y.o.   MRN: [id number] Complaint:  Visit Type: Patient returns to my office for long thick painful nails.  His nails are painful walking and wearing his shoes.  He says he is walking with his diabetic insoles.  He says he has balance issues today and we are planning to transport him to his car in a wheelchair.He presents for preventative foot care services.  Podiatric Exam: Vascular: dorsalis pedis  are palpable bilateral.  Posterior tibial pulses are non palpable  B/L  Capillary return is immediate. Temperature gradient is WNL. Skin turgor WNL Venous stasis noted feet and legs  B/L Sensorium: Diminished Semmes Weinstein monofilament test. Normal tactile sensation bilaterally. Nail Exam: Pt has thick disfigured discolored nails with subungual debris noted bilateral entire nail hallux through fifth toenails.     Ulcer Exam: There is no evidence of ulcer or pre-ulcerative changes or infection. Orthopedic Exam: Muscle tone and strength are WNL. No limitations in general ROM. No crepitus or effusions noted. Foot type and digits show no abnormalities. Hammer toes  B/L. Skin: No Porokeratosis. No infection or ulcers  Diagnosis:    Diabetic neuropathy. Diabetic angiopathy  Onychomycosis  B/L.  Gait instability.  Treatment & Plan Procedures and Treatment: Debridement and grinding long thick nails.  RTC 3 months. Patient was told to present to the office in 3 months for continued foot care.   Patient is  a candidate for diabetic shoes.      Gardiner Barefoot DPM

## 2017-08-23 ENCOUNTER — Other Ambulatory Visit: Payer: Self-pay

## 2017-08-23 DIAGNOSIS — H26492 Other secondary cataract, left eye: Secondary | ICD-10-CM | POA: Diagnosis not present

## 2017-08-23 DIAGNOSIS — E119 Type 2 diabetes mellitus without complications: Secondary | ICD-10-CM | POA: Diagnosis not present

## 2017-08-23 DIAGNOSIS — H5315 Visual distortions of shape and size: Secondary | ICD-10-CM | POA: Diagnosis not present

## 2017-08-23 DIAGNOSIS — H35372 Puckering of macula, left eye: Secondary | ICD-10-CM | POA: Diagnosis not present

## 2017-08-23 LAB — HM DIABETES EYE EXAM

## 2017-08-23 MED ORDER — OMEPRAZOLE 20 MG PO CPDR: DELAYED_RELEASE_CAPSULE | ORAL | 13 refills | Status: DC

## 2017-08-23 NOTE — Telephone Encounter (Signed)
We received PA confirmation for approval for patient's prilosec & I have sent to pharmacy.

## 2017-08-27 ENCOUNTER — Encounter: Payer: Self-pay | Admitting: Endocrinology

## 2017-09-07 ENCOUNTER — Ambulatory Visit (INDEPENDENT_AMBULATORY_CARE_PROVIDER_SITE_OTHER): Payer: Medicare HMO | Admitting: Endocrinology

## 2017-09-07 VITALS — BP 124/64 | HR 61 | Resp 16 | Wt 216.2 lb

## 2017-09-07 DIAGNOSIS — E1159 Type 2 diabetes mellitus with other circulatory complications: Secondary | ICD-10-CM

## 2017-09-07 DIAGNOSIS — R5383 Other fatigue: Secondary | ICD-10-CM | POA: Diagnosis not present

## 2017-09-07 DIAGNOSIS — R200 Anesthesia of skin: Secondary | ICD-10-CM

## 2017-09-07 LAB — VITAMIN B12: VITAMIN B 12: 756 pg/mL (ref 211–911)

## 2017-09-07 LAB — HEMOGLOBIN A1C: Hgb A1c MFr Bld: 6.8 % — ABNORMAL HIGH (ref 4.6–6.5)

## 2017-09-07 NOTE — Progress Notes (Signed)
Subjective:    Patient ID: Gerald Martinez, male    DOB: 17-Feb-1934, 84 y.o.   MRN: [id number]  HPI Pt returns for f/u of diabetes mellitus: DM type: 2 Dx'ed: 4098 Complications: none Therapy: metformin.  DKA: never Severe hypoglycemia: never Pancreatitis: never Pancreatic imaging: normal on 2010 CT Other: he has never been on insulin Pt takes meds as rx'ed. He says cbg's are well-controlled Pt says nurse from ins co recommended to check B-12, due to fatigue.   Past Medical History:  Diagnosis Date  . AAA (abdominal aortic aneurysm) ([place])   . BPH (benign prostatic hypertrophy)   . CAD (coronary artery disease)    Post CABG in 2000.  LIMA to the LAD, SVG to OM, SVG to PDA, last perfusion study in 2012 with no high risk findings  . Diabetes mellitus    Type II  . Dyslipidemia   . Esophageal cancer ([place])   . Hearing loss   . Hypertension     Past Surgical History:  Procedure Laterality Date  . CORONARY ARTERY BYPASS GRAFT  2000  . ESOPHAGECTOMY  2000  . VENTRAL HERNIA REPAIR  2001    Social History   Socioeconomic History  . Marital status: Married    Spouse name: Not on file  . Number of children: Not on file  . Years of education: Not on file  . Highest education level: Not on file  Occupational History  . Not on file  Social Needs  . Financial resource strain: Not on file  . Food insecurity:    Worry: Not on file    Inability: Not on file  . Transportation needs:    Medical: Not on file    Non-medical: Not on file  Tobacco Use  . Smoking status: Former Smoker    Last attempt to quit: 05/25/1997    Years since quitting: 20.3  . Smokeless tobacco: Never Used  Substance and Sexual Activity  . Alcohol use: Yes    Alcohol/week: 3.6 oz    Types: 6 Glasses of wine per week  . Drug use: No  . Sexual activity: Not on file  Lifestyle  . Physical activity:    Days per week: Not on file    Minutes per session: Not on file  . Stress: Not on file  Relationships   . Social connections:    Talks on phone: Not on file    Gets together: Not on file    Attends religious service: Not on file    Active member of club or organization: Not on file    Attends meetings of clubs or organizations: Not on file    Relationship status: Not on file  . Intimate partner violence:    Fear of current or ex partner: Not on file    Emotionally abused: Not on file    Physically abused: Not on file    Forced sexual activity: Not on file  Other Topics Concern  . Not on file  Social History Narrative  . Not on file    Current Outpatient Medications on File Prior to Visit  Medication Sig Dispense Refill  . aspirin EC 81 MG tablet Take 81 mg by mouth daily.    Marland Kitchen doxazosin (CARDURA) 4 MG tablet TAKE 1 TABLET BY MOUTH DAILY. 90 tablet 3  . FREESTYLE LITE test strip USE AS INSTRUCTED ONCE DAILY 90 each 3  . furosemide (LASIX) 20 MG tablet TAKE 1 TABLET BY MOUTH DAILY. 90 tablet 2  .  gabapentin (NEURONTIN) 100 MG capsule Take 1 capsule (100 mg total) by mouth 3 (three) times daily. 90 capsule 3  . Lancets (FREESTYLE) lancets USE AS DIRECTED DAILY. 100 each PRN  . LIDODERM 5 % PLACE 1 PATCH ONTO THE SKIN DAILY. REMOVE & DISCARD PATCH WITHIN 12 HOURS OR AS DIRECTED BY MD. 30 patch 11  . losartan (COZAAR) 50 MG tablet TAKE 1 TABLET BY MOUTH ONCE DAILY. 90 tablet PRN  . meclizine (ANTIVERT) 12.5 MG tablet TAKE 1 TABLET BY MOUTH 3 TIMES A DAY AS NEEDED FOR DIZZINESS. 30 tablet 3  . Melatonin 10 MG TABS Take 1 tablet by mouth as needed.    . metFORMIN (GLUCOPHAGE-XR) 500 MG 24 hr tablet TAKE 1 TABLET BY MOUTH DAILY WITH BREAKFAST. 90 tablet 4  . Multiple Vitamin (MULTIVITAMIN) tablet Take 1 tablet by mouth daily.      Marland Kitchen omeprazole (PRILOSEC) 20 MG capsule TAKE 2 CAPSULES (40 MG TOTAL) BY MOUTH DAILY. (Patient taking differently: TAKE 1 CAPSULES (20 MG TOTAL) BY MOUTH DAILY.) 180 capsule 13  . simvastatin (ZOCOR) 40 MG tablet TAKE 1 TABLET BY MOUTH AT BEDTIME. 90 tablet 3  .  testosterone (TESTIM) 50 MG/5GM (1%) GEL APPLY THE CONTENTS OF 2 TUBES ONTO THE SKIN DAILY. 300 g 5  . traZODone (DESYREL) 100 MG tablet Take 1 tablet (100 mg total) by mouth at bedtime as needed for sleep. 30 tablet 3   No current facility-administered medications on file prior to visit.     Allergies  Allergen Reactions  . Propoxyphene N-Acetaminophen     Family History  Problem Relation Age of Onset  . Heart disease Mother   . Stroke Mother   . Cancer Father        Lymphoma  . Cancer Sister        Uncertain  . Colon cancer Neg Hx     BP 124/64   Pulse 61   Resp 16   Wt 216 lb 3.2 oz (98.1 kg)   SpO2 97%   BMI 30.15 kg/m   Review of Systems No weight change    Objective:   Physical Exam VITAL SIGNS:  See vs page GENERAL: no distress Pulses: dorsalis pedis intact bilat.   MSK: no deformity of the feet CV: 1+ bilat leg edema.  Skin:  no ulcer on the feet.  normal color and temp on the feet.  Old healed surgical scars on the legs (vein harvest) Neuro: sensation is intact to touch on the feet, but decreased from normal Ext: There is bilateral onychomycosis of the toenails  Lab Results  Component Value Date   CREATININE 0.90 03/09/2017   BUN 13 03/09/2017   NA 135 03/09/2017   K 4.4 03/09/2017   CL 100 03/09/2017   CO2 30 03/09/2017   Lab Results  Component Value Date   HGBA1C 6.8 (H) 09/07/2017      Assessment & Plan:  Type 2 DM: well-controlled.  Fatigue: new.  Check B-12.  Patient Instructions  blood tests are requested for you today.  We'll let you know about the results. Please continue the same medications.  Please come back for a regular physical appointment in 6 months (after 03/11/18).

## 2017-09-07 NOTE — Patient Instructions (Addendum)
blood tests are requested for you today.  We'll let you know about the results. Please continue the same medications.  Please come back for a regular physical appointment in 6 months (after 03/11/18).

## 2017-09-23 DIAGNOSIS — H26492 Other secondary cataract, left eye: Secondary | ICD-10-CM | POA: Diagnosis not present

## 2017-09-24 ENCOUNTER — Other Ambulatory Visit: Payer: Self-pay | Admitting: Endocrinology

## 2017-09-24 MED ORDER — TESTOSTERONE 50 MG/5GM (1%) TD GEL: TRANSDERMAL | 5 refills | Status: DC

## 2017-11-17 ENCOUNTER — Encounter: Payer: Self-pay | Admitting: Podiatry

## 2017-11-17 ENCOUNTER — Ambulatory Visit (INDEPENDENT_AMBULATORY_CARE_PROVIDER_SITE_OTHER): Payer: Medicare HMO | Admitting: Podiatry

## 2017-11-17 DIAGNOSIS — M79676 Pain in unspecified toe(s): Secondary | ICD-10-CM | POA: Diagnosis not present

## 2017-11-17 DIAGNOSIS — E0842 Diabetes mellitus due to underlying condition with diabetic polyneuropathy: Secondary | ICD-10-CM

## 2017-11-17 DIAGNOSIS — B351 Tinea unguium: Secondary | ICD-10-CM

## 2017-11-17 DIAGNOSIS — R2681 Unsteadiness on feet: Secondary | ICD-10-CM | POA: Diagnosis not present

## 2017-11-17 NOTE — Progress Notes (Signed)
Patient ID: Gerald Martinez, male   DOB: 1933-09-21, 84 y.o.   MRN: [id number] Complaint:  Visit Type: Patient returns to my office for long thick painful nails.  His nails are painful walking and wearing his shoes.  He says he is walking with his diabetic insoles.  He says he has balance issues today and we are planning to transport him to his car in a wheelchair.He presents for preventative foot care services.  Podiatric Exam: Vascular: dorsalis pedis  are palpable bilateral.  Posterior tibial pulses are non palpable  B/L  Capillary return is immediate. Temperature gradient is WNL. Skin turgor WNL Venous stasis noted feet and legs  B/L Sensorium: Diminished Semmes Weinstein monofilament test. Normal tactile sensation bilaterally. Nail Exam: Pt has thick disfigured discolored nails with subungual debris noted bilateral entire nail hallux through fifth toenails.     Ulcer Exam: There is no evidence of ulcer or pre-ulcerative changes or infection. Orthopedic Exam: Muscle tone and strength are WNL. No limitations in general ROM. No crepitus or effusions noted. Foot type and digits show no abnormalities. Hammer toes  B/L. Skin: No Porokeratosis. No infection or ulcers  Diagnosis:    Diabetic neuropathy. Diabetic angiopathy  Onychomycosis  B/L.  Gait instability.  Treatment & Plan Procedures and Treatment: Debridement and grinding long thick nails.  RTC 3 months. Patient was told to present to the office in 3 months for continued foot care.   Patient needs to be appointed with [facility] for new diabetic shoes/brace.      Gardiner Barefoot DPM

## 2017-11-22 ENCOUNTER — Ambulatory Visit (INDEPENDENT_AMBULATORY_CARE_PROVIDER_SITE_OTHER): Payer: Medicare HMO | Admitting: Orthotics

## 2017-11-22 DIAGNOSIS — M722 Plantar fascial fibromatosis: Secondary | ICD-10-CM | POA: Diagnosis not present

## 2017-11-22 DIAGNOSIS — E1140 Type 2 diabetes mellitus with diabetic neuropathy, unspecified: Secondary | ICD-10-CM

## 2017-11-22 DIAGNOSIS — R2681 Unsteadiness on feet: Secondary | ICD-10-CM

## 2017-11-22 DIAGNOSIS — E0851 Diabetes mellitus due to underlying condition with diabetic peripheral angiopathy without gangrene: Secondary | ICD-10-CM

## 2017-11-22 DIAGNOSIS — E0842 Diabetes mellitus due to underlying condition with diabetic polyneuropathy: Secondary | ICD-10-CM

## 2017-11-22 NOTE — Progress Notes (Signed)
Patient cast today for [id number] f/o diabetic light weight accommodative.  Checked with [facility] and [place] [employer] paid for in past.

## 2017-12-13 ENCOUNTER — Ambulatory Visit (INDEPENDENT_AMBULATORY_CARE_PROVIDER_SITE_OTHER): Payer: Medicare HMO | Admitting: Orthotics

## 2017-12-13 DIAGNOSIS — E0842 Diabetes mellitus due to underlying condition with diabetic polyneuropathy: Secondary | ICD-10-CM

## 2017-12-13 DIAGNOSIS — E1140 Type 2 diabetes mellitus with diabetic neuropathy, unspecified: Secondary | ICD-10-CM

## 2017-12-13 DIAGNOSIS — M722 Plantar fascial fibromatosis: Secondary | ICD-10-CM

## 2017-12-13 DIAGNOSIS — R2681 Unsteadiness on feet: Secondary | ICD-10-CM

## 2017-12-13 NOTE — Progress Notes (Signed)
Patient came in today to p/up functional foot orthotics.   The orthotics were assessed to both fit and function.  The F/O addressed the biomechanical issues/pathologies as intended, offering good longitudinal arch support, proper offloading, and foot support. There weren't any signs of discomfort or irritation.  The F/O fit properly in footwear with minimal trimming/adjustments. 

## 2017-12-15 ENCOUNTER — Other Ambulatory Visit: Payer: Self-pay | Admitting: Endocrinology

## 2018-01-12 ENCOUNTER — Other Ambulatory Visit: Payer: Self-pay

## 2018-01-12 ENCOUNTER — Telehealth: Payer: Self-pay | Admitting: Emergency Medicine

## 2018-01-12 DIAGNOSIS — H6123 Impacted cerumen, bilateral: Secondary | ICD-10-CM | POA: Diagnosis not present

## 2018-01-12 NOTE — Telephone Encounter (Signed)
AI spoke with Aldona Bar from [facility] verified current medications.

## 2018-01-12 NOTE — Telephone Encounter (Signed)
Alroy Dust with [employer] called and asked that you give them a call back. They need you to go over the patients medication list to verify all his medications are correct. Please give them a call back thanks.

## 2018-01-18 ENCOUNTER — Telehealth: Payer: Self-pay | Admitting: Podiatry

## 2018-01-18 NOTE — Telephone Encounter (Signed)
Pt called  Asking to speak to Gerald Martinez upset because he got a bill for the orthotics and was told they would be covered by insurance.

## 2018-01-26 DIAGNOSIS — R69 Illness, unspecified: Secondary | ICD-10-CM | POA: Diagnosis not present

## 2018-02-04 ENCOUNTER — Other Ambulatory Visit: Payer: Self-pay | Admitting: Endocrinology

## 2018-02-16 ENCOUNTER — Ambulatory Visit: Payer: Medicare HMO | Admitting: Podiatry

## 2018-02-28 ENCOUNTER — Other Ambulatory Visit: Payer: Self-pay | Admitting: Endocrinology

## 2018-03-09 ENCOUNTER — Ambulatory Visit (INDEPENDENT_AMBULATORY_CARE_PROVIDER_SITE_OTHER): Payer: Medicare HMO | Admitting: Endocrinology

## 2018-03-09 ENCOUNTER — Encounter: Payer: Self-pay | Admitting: Endocrinology

## 2018-03-09 VITALS — BP 132/62 | HR 68 | Ht 68.25 in | Wt 215.8 lb

## 2018-03-09 DIAGNOSIS — E1159 Type 2 diabetes mellitus with other circulatory complications: Secondary | ICD-10-CM | POA: Diagnosis not present

## 2018-03-09 DIAGNOSIS — I10 Essential (primary) hypertension: Secondary | ICD-10-CM | POA: Diagnosis not present

## 2018-03-09 DIAGNOSIS — Z125 Encounter for screening for malignant neoplasm of prostate: Secondary | ICD-10-CM | POA: Diagnosis not present

## 2018-03-09 DIAGNOSIS — Z0000 Encounter for general adult medical examination without abnormal findings: Secondary | ICD-10-CM

## 2018-03-09 LAB — POCT GLYCOSYLATED HEMOGLOBIN (HGB A1C): HEMOGLOBIN A1C: 6.3 % — AB (ref 4.0–5.6)

## 2018-03-09 LAB — CBC WITH DIFFERENTIAL/PLATELET
Basophils Absolute: 0 10*3/uL (ref 0.0–0.1)
Basophils Relative: 0.7 % (ref 0.0–3.0)
EOS PCT: 1.7 % (ref 0.0–5.0)
Eosinophils Absolute: 0.1 10*3/uL (ref 0.0–0.7)
HCT: 43.8 % (ref 39.0–52.0)
Hemoglobin: 14.2 g/dL (ref 13.0–17.0)
LYMPHS ABS: 1.4 10*3/uL (ref 0.7–4.0)
Lymphocytes Relative: 25 % (ref 12.0–46.0)
MCHC: 32.3 g/dL (ref 30.0–36.0)
MCV: 82.4 fl (ref 78.0–100.0)
MONOS PCT: 11.3 % (ref 3.0–12.0)
Monocytes Absolute: 0.6 10*3/uL (ref 0.1–1.0)
NEUTROS ABS: 3.4 10*3/uL (ref 1.4–7.7)
NEUTROS PCT: 61.3 % (ref 43.0–77.0)
PLATELETS: 179 10*3/uL (ref 150.0–400.0)
RBC: 5.31 Mil/uL (ref 4.22–5.81)
RDW: 17 % — ABNORMAL HIGH (ref 11.5–15.5)
WBC: 5.5 10*3/uL (ref 4.0–10.5)

## 2018-03-09 LAB — URINALYSIS, ROUTINE W REFLEX MICROSCOPIC
BILIRUBIN URINE: NEGATIVE
Hgb urine dipstick: NEGATIVE
Ketones, ur: NEGATIVE
Nitrite: NEGATIVE
PH: 7 (ref 5.0–8.0)
RBC / HPF: NONE SEEN (ref 0–?)
Specific Gravity, Urine: <=1.005 — AB (ref 1.000–1.030)
Total Protein, Urine: NEGATIVE
Urine Glucose: NEGATIVE
Urobilinogen, UA: 0.2 (ref 0.0–1.0)

## 2018-03-09 LAB — BASIC METABOLIC PANEL
BUN: 14 mg/dL (ref 6–23)
CALCIUM: 9.4 mg/dL (ref 8.4–10.5)
CHLORIDE: 100 meq/L (ref 96–112)
CO2: 33 mEq/L — ABNORMAL HIGH (ref 19–32)
CREATININE: 0.97 mg/dL (ref 0.40–1.50)
GFR: 78.77 mL/min (ref >60.00)
Glucose, Bld: 117 mg/dL — ABNORMAL HIGH (ref 70–99)
Potassium: 4.5 mEq/L (ref 3.5–5.1)
Sodium: 136 mEq/L (ref 135–145)

## 2018-03-09 LAB — LIPID PANEL
CHOL/HDL RATIO: 3
Cholesterol: 124 mg/dL (ref 0–200)
HDL: 40.7 mg/dL (ref >39.00)
NONHDL: 83.14
TRIGLYCERIDES: 214 mg/dL — AB (ref 0.0–149.0)
VLDL: 42.8 mg/dL — ABNORMAL HIGH (ref 0.0–40.0)

## 2018-03-09 LAB — LDL CHOLESTEROL, DIRECT: Direct LDL: 69 mg/dL

## 2018-03-09 LAB — MICROALBUMIN / CREATININE URINE RATIO
Creatinine,U: 64.3 mg/dL
Microalb Creat Ratio: 2.7 mg/g (ref 0.0–30.0)
Microalb, Ur: 1.7 mg/dL (ref 0.0–1.9)

## 2018-03-09 LAB — HEPATIC FUNCTION PANEL
ALBUMIN: 4.1 g/dL (ref 3.5–5.2)
ALT: 25 U/L (ref 0–53)
AST: 23 U/L (ref 0–37)
Alkaline Phosphatase: 59 U/L (ref 39–117)
Bilirubin, Direct: 0.1 mg/dL (ref 0.0–0.3)
Total Bilirubin: 0.4 mg/dL (ref 0.2–1.2)
Total Protein: 6.5 g/dL (ref 6.0–8.3)

## 2018-03-09 LAB — PSA: PSA: 4.06 ng/mL — AB (ref 0.10–4.00)

## 2018-03-09 LAB — TSH: TSH: 2.46 u[IU]/mL (ref 0.35–4.50)

## 2018-03-09 NOTE — Progress Notes (Signed)
Subjective:    Patient ID: Gerald Martinez, male    DOB: 09/22/1933, 84 y.o.   MRN: [id number]  HPI Pt is here for regular wellness examination, and is feeling pretty well in general, and says chronic med probs are stable, except as noted below Past Medical History:  Diagnosis Date  . AAA (abdominal aortic aneurysm) ([place])   . BPH (benign prostatic hypertrophy)   . CAD (coronary artery disease)    Post CABG in 2000.  LIMA to the LAD, SVG to OM, SVG to PDA, last perfusion study in 2012 with no high risk findings  . Diabetes mellitus    Type II  . Dyslipidemia   . Esophageal cancer ([place])   . Hearing loss   . Hypertension     Past Surgical History:  Procedure Laterality Date  . CORONARY ARTERY BYPASS GRAFT  2000  . ESOPHAGECTOMY  2000  . VENTRAL HERNIA REPAIR  2001    Social History   Socioeconomic History  . Marital status: Married    Spouse name: Not on file  . Number of children: Not on file  . Years of education: Not on file  . Highest education level: Not on file  Occupational History  . Not on file  Social Needs  . Financial resource strain: Not on file  . Food insecurity:    Worry: Not on file    Inability: Not on file  . Transportation needs:    Medical: Not on file    Non-medical: Not on file  Tobacco Use  . Smoking status: Former Smoker    Last attempt to quit: 05/25/1997    Years since quitting: 20.8  . Smokeless tobacco: Never Used  Substance and Sexual Activity  . Alcohol use: Yes    Alcohol/week: 6.0 standard drinks    Types: 6 Glasses of wine per week  . Drug use: No  . Sexual activity: Not on file  Lifestyle  . Physical activity:    Days per week: Not on file    Minutes per session: Not on file  . Stress: Not on file  Relationships  . Social connections:    Talks on phone: Not on file    Gets together: Not on file    Attends religious service: Not on file    Active member of club or organization: Not on file    Attends meetings of clubs or  organizations: Not on file    Relationship status: Not on file  . Intimate partner violence:    Fear of current or ex partner: Not on file    Emotionally abused: Not on file    Physically abused: Not on file    Forced sexual activity: Not on file  Other Topics Concern  . Not on file  Social History Narrative  . Not on file    Current Outpatient Medications on File Prior to Visit  Medication Sig Dispense Refill  . aspirin EC 81 MG tablet Take 81 mg by mouth daily.    Marland Kitchen doxazosin (CARDURA) 4 MG tablet TAKE 1 TABLET BY MOUTH DAILY. 90 tablet 3  . FREESTYLE LITE test strip USE AS INSTRUCTED ONCE DAILY 90 each 3  . furosemide (LASIX) 20 MG tablet TAKE 1 TABLET BY MOUTH DAILY. 90 tablet 2  . gabapentin (NEURONTIN) 100 MG capsule Take 1 capsule (100 mg total) by mouth 3 (three) times daily. 90 capsule 3  . Lancets (FREESTYLE) lancets USE AS DIRECTED DAILY. 100 each PRN  .  LIDODERM 5 % PLACE 1 PATCH ONTO THE SKIN DAILY. REMOVE & DISCARD PATCH WITHIN 12 HOURS OR AS DIRECTED BY MD. 30 patch 11  . losartan (COZAAR) 50 MG tablet TAKE 1 TABLET BY MOUTH ONCE DAILY. 90 tablet PRN  . meclizine (ANTIVERT) 12.5 MG tablet TAKE 1 TABLET BY MOUTH 3 TIMES A DAY AS NEEDED FOR DIZZINESS. 30 tablet 3  . Melatonin 10 MG TABS Take 1 tablet by mouth as needed.    . metFORMIN (GLUCOPHAGE-XR) 500 MG 24 hr tablet TAKE 1 TABLET BY MOUTH DAILY WITH BREAKFAST. 90 tablet 4  . Multiple Vitamin (MULTIVITAMIN) tablet Take 1 tablet by mouth daily.      Marland Kitchen omeprazole (PRILOSEC) 20 MG capsule TAKE 2 CAPSULES (40 MG TOTAL) BY MOUTH DAILY. (Patient taking differently: TAKE 1 CAPSULES (20 MG TOTAL) BY MOUTH DAILY.) 180 capsule 13  . simvastatin (ZOCOR) 40 MG tablet TAKE 1 TABLET BY MOUTH AT BEDTIME. 90 tablet 3  . testosterone (TESTIM) 50 MG/5GM (1%) GEL APPLY THE CONTENTS OF 2 TUBES ONTO THE SKIN DAILY 300 g 5  . traZODone (DESYREL) 100 MG tablet Take 1 tablet (100 mg total) by mouth at bedtime as needed for sleep. 30 tablet 3    No current facility-administered medications on file prior to visit.     Allergies  Allergen Reactions  . Propoxyphene N-Acetaminophen     Family History  Problem Relation Age of Onset  . Heart disease Mother   . Stroke Mother   . Cancer Father        Lymphoma  . Cancer Sister        Uncertain  . Colon cancer Neg Hx     BP 132/62 (BP Location: Left Arm, Patient Position: Sitting, Cuff Size: Normal)   Pulse 68   Ht 5' 8.25" (1.734 m)   Wt 215 lb 12.8 oz (97.9 kg)   SpO2 95%   BMI 32.57 kg/m     Review of Systems Denies fever, fatigue, diplopia, earache, chest pain, sob, back pain, anxiety, cold intolerance, BRBPR, hematuria, syncope, numbness, allergy sxs, easy bruising, and rash.  He has easy bruising    Objective:   Physical Exam VS: see vs page GEN: no distress HEAD: head: no deformity eyes: no periorbital swelling, no proptosis external nose and ears are normal mouth: no lesion seen NECK: supple, thyroid is not enlarged CHEST WALL: no deformity.  Old healed surgical scar.   LUNGS: clear to auscultation CV: reg rate and rhythm, no murmur.   ABD: abdomen is soft, nontender.  no hepatosplenomegaly.  not distended.  no hernia.  Old healed surgical scar MUSCULOSKELETAL: muscle bulk and strength are grossly normal.  no obvious joint swelling.  gait is steady with a cane  EXTEMITIES: no deformity.  no ulcer on the feet.  feet are of normal temp, but the is chronic dense erythema).  Trace bilat edema. There is bilateral onychomycosis of the toenails.  Old healed surgical scars on the legs.   PULSES: dorsalis pedis intact bilat.  no carotid bruit NEURO:  cn 2-12 grossly intact.   readily moves all 4's.  sensation is intact to touch on the feet.   SKIN:  Normal texture and temperature.  No rash or suspicious lesion is visible.  Few ecchymoses on the forearms.   NODES:  None palpable at the neck PSYCH: alert, well-oriented.  Does not appear anxious nor depressed.     I personally reviewed electrocardiogram tracing (today): Indication: HTN Impression: NSR, with 1D  AVB.  No MI.  Poss RVH Compared to 2017: no significant change.      Assessment & Plan:  Wellness visit today, with problems stable, except as noted.  Subjective:   Patient here for Medicare annual wellness visit and management of other chronic and acute problems.     Risk factors: advanced age    56 of Physicians Providing Medical Care to Patient:  See "snapshot"   Activities of Daily Living: In your present state of health, do you have any difficulty performing the following activities (lives with wife)?:  Preparing food and eating?: No  Bathing yourself: No  Getting dressed: No  Using the toilet:No  Moving around from place to place: No  In the past year have you fallen or had a near fall?: No    Home Safety: Has smoke detector and wears seat belts. Firearms are safely stored. No excess sun exposure.     Opioid Use: none   Diet and Exercise  Current exercise habits: pt says good Dietary issues discussed: pt reports a fairly healthy diet   Depression Screen  Q1: Over the past two weeks, have you felt down, depressed or hopeless?no  Q2: Over the past two weeks, have you felt little interest or pleasure in doing things? no   The following portions of the patient's history were reviewed and updated as appropriate: allergies, current medications, past family history, past medical history, past social history, past surgical history and problem list.   Review of Systems  Denies hearing loss, and visual loss Objective:   Vision:  See [facility] today Hearing: grossly normal Body mass index:  See vs page Msk: pt easily and quickly performs "get-up-and-go" from a sitting position Cognitive Impairment Assessment: cognition, memory and judgment appear normal.  remembers 2/3 at 5 minutes.  excellent recall.  can easily read and write a sentence.  alert and oriented x 3.      Assessment:   Medicare wellness utd on preventive parameters    Plan:   During the course of the visit the patient was educated and counseled about appropriate screening and preventive services including:        Fall prevention is advised today Diabetes screening is UTD Nutrition counseling is offered  advanced directives/end of life addressed today:  see healthcare directives hyperlink.    Vaccines are updated as needed  Patient Instructions (the written plan) was given to the patient.

## 2018-03-09 NOTE — Progress Notes (Signed)
we discussed code status.  pt requests full code, but would not want to be started or maintained on artificial life-support measures if there was not a reasonable chance of recovery 

## 2018-03-09 NOTE — Patient Instructions (Addendum)
good diet and exercise significantly improve the control of your diabetes.  please let me know if you wish to be referred to a dietician.  high blood sugar is very risky to your health.  you should see an eye doctor and dentist every year.  It is very important to get all recommended vaccinations.  Please consider these measures for your health:  minimize alcohol.  Do not use tobacco products.  Have a colonoscopy at least every 10 years from age 82.  Keep firearms safely stored.  Always use seat belts.  have working smoke alarms in your home.  See an eye doctor and dentist regularly.  Never drive under the influence of alcohol or drugs (including prescription drugs).  Those with fair skin should take precautions against the sun, and should carefully examine their skin once per month, for any new or changed moles.  please let me know what your wishes would be, if artificial life support measures should become necessary.  It is critically important to prevent falling down (keep floor areas well-lit, dry, and free of loose objects.  If you have a cane, walker, or wheelchair, you should use it, even for short trips around the house.  Wear flat-soled shoes.  Also, try not to rush) blood tests are requested for you today.  We'll let you know about the results.

## 2018-03-21 NOTE — Progress Notes (Signed)
HPI Patient presents for followup of his known coronary disease. Since I last saw him he has been doing well.  He still has problems with balance and with his joints and nerves causing him to have difficulty but he is avoided falls.  He still gets on a treadmill and is exercising 4-5 times per week. The patient denies any new symptoms such as chest discomfort, neck or arm discomfort. There has been no new shortness of breath, PND or orthopnea. There have been no reported palpitations, presyncope or syncope.  He said today he is having significant family stress   Allergies  Allergen Reactions  . Propoxyphene N-Acetaminophen     Current Outpatient Medications  Medication Sig Dispense Refill  . aspirin EC 81 MG tablet Take 81 mg by mouth daily.    Marland Kitchen doxazosin (CARDURA) 4 MG tablet TAKE 1 TABLET BY MOUTH DAILY. 90 tablet 3  . FREESTYLE LITE test strip USE AS INSTRUCTED ONCE DAILY 90 each 3  . furosemide (LASIX) 20 MG tablet TAKE 1 TABLET BY MOUTH DAILY. 90 tablet 2  . gabapentin (NEURONTIN) 100 MG capsule Take 1 capsule (100 mg total) by mouth 3 (three) times daily. 90 capsule 3  . Lancets (FREESTYLE) lancets USE AS DIRECTED DAILY. 100 each PRN  . losartan (COZAAR) 50 MG tablet TAKE 1 TABLET BY MOUTH ONCE DAILY. 90 tablet PRN  . meclizine (ANTIVERT) 12.5 MG tablet TAKE 1 TABLET BY MOUTH 3 TIMES A DAY AS NEEDED FOR DIZZINESS. 30 tablet 3  . Melatonin 10 MG TABS Take 1 tablet by mouth as needed.    . metFORMIN (GLUCOPHAGE-XR) 500 MG 24 hr tablet TAKE 1 TABLET BY MOUTH DAILY WITH BREAKFAST. 90 tablet 4  . Multiple Vitamin (MULTIVITAMIN) tablet Take 1 tablet by mouth daily.      Marland Kitchen omeprazole (PRILOSEC) 20 MG capsule TAKE 2 CAPSULES (40 MG TOTAL) BY MOUTH DAILY. (Patient taking differently: TAKE 1 CAPSULES (20 MG TOTAL) BY MOUTH DAILY.) 180 capsule 13  . simvastatin (ZOCOR) 40 MG tablet TAKE 1 TABLET BY MOUTH AT BEDTIME. 90 tablet 3  . testosterone (TESTIM) 50 MG/5GM (1%) GEL APPLY THE  CONTENTS OF 2 TUBES ONTO THE SKIN DAILY 300 g 5  . traZODone (DESYREL) 100 MG tablet Take 1 tablet (100 mg total) by mouth at bedtime as needed for sleep. (Patient not taking: Reported on 03/22/2018) 30 tablet 3   No current facility-administered medications for this visit.     Past Medical History:  Diagnosis Date  . AAA (abdominal aortic aneurysm) ([place])   . BPH (benign prostatic hypertrophy)   . CAD (coronary artery disease)    Post CABG in 2000.  LIMA to the LAD, SVG to OM, SVG to PDA, last perfusion study in 2012 with no high risk findings  . Diabetes mellitus    Type II  . Dyslipidemia   . Esophageal cancer ([place])   . Hearing loss   . Hypertension     Past Surgical History:  Procedure Laterality Date  . CORONARY ARTERY BYPASS GRAFT  2000  . ESOPHAGECTOMY  2000  . VENTRAL HERNIA REPAIR  2001    ROS:   As stated in the HPI and negative for all other systems.  PHYSICAL EXAM BP (!) 130/50   Pulse 80   Ht 5\' 10"  (1.778 m)   Wt 213 lb (96.6 kg)   SpO2 95%   BMI 30.56 kg/m   GENERAL:  Well appearing NECK:  No jugular venous distention,  waveform within normal limits, carotid upstroke brisk and symmetric, no bruits, no thyromegaly LUNGS:  Clear to auscultation bilaterally CHEST:  Unremarkable HEART:  PMI not displaced or sustained,S1 and S2 within normal limits, no S3, no S4, no clicks, no rubs, no murmurs ABD:  Flat, positive bowel sounds normal in frequency in pitch, no bruits, no rebound, no guarding, no midline pulsatile mass, no hepatomegaly, no splenomegaly EXT:  2 plus pulses throughout, no edema, no cyanosis no clubbing    EKG:  Sinus rhythm, rate 66, RAD,  intervals within normal limits, no acute ST-T wave changes.  03/09/18   Lab Results  Component Value Date   CHOL 124 03/09/2018   TRIG 214.0 (H) 03/09/2018   HDL 40.70 03/09/2018   LDLCALC 63 03/09/2017   LDLDIRECT 69.0 03/09/2018   Lab Results  Component Value Date   HGBA1C 6.3 (A) 03/09/2018     ASSESSMENT AND PLAN  CORONARY ATHEROSCLEROSIS NATIVE CORONARY ARTERY - The patient has no new sypmtoms.  No further cardiovascular testing is indicated.  We will continue with aggressive risk reduction and meds as listed.  HYPERTENSION -  The blood pressure is at target.  No change in therapy.   HYPERCHOLESTEROLEMIA -  His lipids were excellent as above.  No change in therapy  AAA - He had stable repair 4 years ago.   No further imaging at this time.   BALANCE - At the last visit I ordered a PT consult. He did not want to pursue this.

## 2018-03-22 ENCOUNTER — Ambulatory Visit (INDEPENDENT_AMBULATORY_CARE_PROVIDER_SITE_OTHER): Payer: Medicare HMO | Admitting: Cardiology

## 2018-03-22 ENCOUNTER — Encounter: Payer: Self-pay | Admitting: Cardiology

## 2018-03-22 VITALS — BP 130/50 | HR 80 | Ht 70.0 in | Wt 213.0 lb

## 2018-03-22 DIAGNOSIS — I10 Essential (primary) hypertension: Secondary | ICD-10-CM | POA: Diagnosis not present

## 2018-03-22 DIAGNOSIS — E785 Hyperlipidemia, unspecified: Secondary | ICD-10-CM

## 2018-03-22 DIAGNOSIS — I2510 Atherosclerotic heart disease of native coronary artery without angina pectoris: Secondary | ICD-10-CM | POA: Diagnosis not present

## 2018-03-22 NOTE — Patient Instructions (Signed)
Medication Instructions:  Continue current medications  If you need a refill on your cardiac medications before your next appointment, please call your pharmacy.  Labwork: None Ordered   If you have labs (blood work) drawn today and your tests are completely normal, you will receive your results only by: Marland Kitchen MyChart Message (if you have MyChart) OR . A paper copy in the mail If you have any lab test that is abnormal or we need to change your treatment, we will call you to review the results.  Testing/Procedures: None Ordered  Follow-Up: You will need a follow up appointment in 1 Year.  Please call our office 2 months in [phone]) to schedule the appointment.  You may see  DR Percival Spanish or one of the following Advanced Practice Providers on your designated Care Team:   . Jory Sims, DNP, ANP . Rhonda Barrett, PA-C .  Marland Kitchen Kerin Ransom, PA-C . Daleen Snook Kroeger, PA-C . Sande Rives, PA-C .  Marland Kitchen Almyra Deforest, PA-C . Fabian Sharp, PA-C  At [facility], you and your health needs are our priority.  As part of our continuing mission to provide you with exceptional heart care, we have created designated Provider Care Teams.  These Care Teams include your primary Cardiologist (physician) and Advanced Practice Providers (APPs -  Physician Assistants and Nurse Practitioners) who all work together to provide you with the care you need, when you need it.   Thank you for choosing [facility] at [facility]!!     Billey Gosling, MD Pricilla Holm, MD Cathlean Cower, MD Scarlette Calico, MD Lew Dawes, MD    [address] [place], [place]: [phone] Fax: [phone]

## 2018-03-25 ENCOUNTER — Other Ambulatory Visit: Payer: Self-pay | Admitting: Endocrinology

## 2018-03-25 NOTE — Telephone Encounter (Signed)
Please advise 

## 2018-03-25 NOTE — Telephone Encounter (Signed)
I have sent a prescription to your pharmacy. Further refills from new PCP

## 2018-04-06 ENCOUNTER — Other Ambulatory Visit: Payer: Self-pay | Admitting: Endocrinology

## 2018-04-18 DIAGNOSIS — N401 Enlarged prostate with lower urinary tract symptoms: Secondary | ICD-10-CM | POA: Diagnosis not present

## 2018-04-18 DIAGNOSIS — R350 Frequency of micturition: Secondary | ICD-10-CM | POA: Diagnosis not present

## 2018-04-18 DIAGNOSIS — R972 Elevated prostate specific antigen [PSA]: Secondary | ICD-10-CM | POA: Diagnosis not present

## 2018-04-20 ENCOUNTER — Encounter: Payer: Self-pay | Admitting: Podiatry

## 2018-04-20 ENCOUNTER — Ambulatory Visit (INDEPENDENT_AMBULATORY_CARE_PROVIDER_SITE_OTHER): Payer: Medicare HMO | Admitting: Podiatry

## 2018-04-20 DIAGNOSIS — E1140 Type 2 diabetes mellitus with diabetic neuropathy, unspecified: Secondary | ICD-10-CM | POA: Diagnosis not present

## 2018-04-20 DIAGNOSIS — B351 Tinea unguium: Secondary | ICD-10-CM | POA: Diagnosis not present

## 2018-04-20 DIAGNOSIS — M79676 Pain in unspecified toe(s): Secondary | ICD-10-CM | POA: Diagnosis not present

## 2018-04-20 NOTE — Progress Notes (Signed)
Patient ID: Gerald Martinez, male   DOB: [birth year], 84 y.o.   MRN: [id number] Complaint:  Visit Type: Patient returns to my office for long thick painful nails.  His nails are painful walking and wearing his shoes. He presents for preventative foot care services.  Podiatric Exam: Vascular: dorsalis pedis  are palpable bilateral.  Posterior tibial pulses are non palpable  B/L  Capillary return is immediate. Temperature gradient is WNL. Skin turgor WNL Venous stasis noted feet and legs  B/L Sensorium: Diminished Semmes Weinstein monofilament test. Normal tactile sensation bilaterally. Nail Exam: Pt has thick disfigured discolored nails with subungual debris noted bilateral entire nail hallux through fifth toenails.     Ulcer Exam: There is no evidence of ulcer or pre-ulcerative changes or infection. Orthopedic Exam: Muscle tone and strength are WNL. No limitations in general ROM. No crepitus or effusions noted. Foot type and digits show no abnormalities. Hammer toes  B/L. Skin: No Porokeratosis. No infection or ulcers  Diagnosis:    Diabetic neuropathy. Diabetic angiopathy  Onychomycosis  B/L  Treatment & Plan Procedures and Treatment: Debridement and grinding long thick nails.  RTC 3 months.    Return Visit-Office Procedure: Patient instructed to return to the office for follow-up for preventative foot care services.  Gardiner Barefoot DPM

## 2018-04-25 ENCOUNTER — Telehealth: Payer: Self-pay | Admitting: Internal Medicine

## 2018-04-25 NOTE — Telephone Encounter (Signed)
Copied from [place] [id number]. Topic: General - Other >> Apr 22, 2018 12:40 PM Oneta Rack wrote: Relation to pt: self  Call back number: [phone]   Reason for call:  Patient of Renato Shin, MD was advise to establish care with a PCP at the [facility] location. Patient requesting a male provider, did advise patient of the physicians who were accepting. Patient prefers a male MD. Please advise if  Dr. Jenny Reichmann, Dr. Ronnald Ramp or Dr. Alain Marion would be willing to take on this patient ?

## 2018-04-25 NOTE — Telephone Encounter (Signed)
Ok with me 

## 2018-04-26 NOTE — Telephone Encounter (Signed)
LVM informing patient Gerald Martinez excepted him but he is establishing care at [facility] with Dr. Waunita Schooner so I told him I was disregarding the message and to call back for nay questions

## 2018-04-28 ENCOUNTER — Encounter: Payer: Self-pay | Admitting: Family Medicine

## 2018-04-28 ENCOUNTER — Ambulatory Visit (INDEPENDENT_AMBULATORY_CARE_PROVIDER_SITE_OTHER): Payer: Medicare HMO | Admitting: Family Medicine

## 2018-04-28 VITALS — BP 126/62 | HR 70 | Temp 97.5°F | Resp 12 | Ht 69.5 in | Wt 215.0 lb

## 2018-04-28 DIAGNOSIS — K219 Gastro-esophageal reflux disease without esophagitis: Secondary | ICD-10-CM | POA: Diagnosis not present

## 2018-04-28 DIAGNOSIS — E299 Testicular dysfunction, unspecified: Secondary | ICD-10-CM | POA: Diagnosis not present

## 2018-04-28 DIAGNOSIS — C159 Malignant neoplasm of esophagus, unspecified: Secondary | ICD-10-CM

## 2018-04-28 DIAGNOSIS — I10 Essential (primary) hypertension: Secondary | ICD-10-CM

## 2018-04-28 MED ORDER — METFORMIN HCL ER 500 MG PO TB24: 500.0000 mg | ORAL_TABLET | Freq: Every day | ORAL | 4 refills | Status: DC

## 2018-04-28 MED ORDER — DOXAZOSIN MESYLATE 4 MG PO TABS: 4.0000 mg | ORAL_TABLET | Freq: Every day | ORAL | 2 refills | Status: DC

## 2018-04-28 MED ORDER — GABAPENTIN 100 MG PO CAPS: 100.0000 mg | ORAL_CAPSULE | Freq: Three times a day (TID) | ORAL | 3 refills | Status: DC

## 2018-04-28 MED ORDER — TESTIM 50 MG/5GM (1%) TD GEL: TRANSDERMAL | 0 refills | Status: DC

## 2018-04-28 MED ORDER — LOSARTAN POTASSIUM 50 MG PO TABS: 50.0000 mg | ORAL_TABLET | Freq: Every day | ORAL | 99 refills | Status: DC

## 2018-04-28 MED ORDER — SIMVASTATIN 40 MG PO TABS: 40.0000 mg | ORAL_TABLET | Freq: Every day | ORAL | 3 refills | Status: DC

## 2018-04-28 MED ORDER — OMEPRAZOLE 20 MG PO CPDR: 20.0000 mg | DELAYED_RELEASE_CAPSULE | Freq: Every day | ORAL | 3 refills | Status: DC

## 2018-04-28 MED ORDER — FUROSEMIDE 20 MG PO TABS: 20.0000 mg | ORAL_TABLET | Freq: Every day | ORAL | 2 refills | Status: DC

## 2018-04-28 MED ORDER — PROMETHAZINE-CODEINE 6.25-10 MG/5ML PO SYRP: 5.0000 mL | ORAL_SOLUTION | Freq: Three times a day (TID) | ORAL | 0 refills | Status: DC | PRN

## 2018-04-28 NOTE — Assessment & Plan Note (Signed)
BP at goal. Meds refilled

## 2018-04-28 NOTE — Assessment & Plan Note (Addendum)
Takes omeprazole every night->will switch to morning to be fasting before taking. Does not typically have heartburn symptoms

## 2018-04-28 NOTE — Patient Instructions (Addendum)
Start taking Omeprazole in the morning. And 30-60 minutes before breakfast  Return for medicare wellness with nurse

## 2018-04-28 NOTE — Assessment & Plan Note (Signed)
Per report s/p esophagectomy. EGD 2009 with severe esophagitis. Unclear if follow-up. Will discuss at next appointment

## 2018-04-28 NOTE — Assessment & Plan Note (Signed)
Will continue given this enables him to remain active

## 2018-04-28 NOTE — Progress Notes (Signed)
Subjective:     Gerald Martinez is a 84 y.o. male presenting for Establish Care (previous PCP Dr Loanne Drilling.) and Cough (refill of Codeine syrup. Cough due to acid reflux.)     HPI  #GERD - hx of esophageal cancer - tries to avoid triggering food - omeprazole + pepto bismal prn if symptoms are worse  When he had reflux over night and it causes irritated throat - without the codeine cough syrup he takes the Nyquil medication and does not tolerate due to being sluggish  #HTN - no cp, sob - no edema - no headache, vision changes - no side effects to medication - sleeps in lazy boy due to back and leg pain  #Low testosterone  - doing much better - medication x 5 years - makes it able to exercise  Review of Systems  Constitutional: Negative for chills and fever.  HENT: Negative for ear pain, rhinorrhea, sinus pressure and sore throat.   Eyes: Negative for pain and visual disturbance.  Respiratory: Positive for cough. Negative for chest tightness and shortness of breath.   Cardiovascular: Negative for chest pain and leg swelling.  Gastrointestinal: Negative for abdominal pain, nausea and vomiting.  Endocrine: Negative for cold intolerance and heat intolerance.  Genitourinary: Negative.   Musculoskeletal: Negative for back pain.  Skin: Negative for rash.  Allergic/Immunologic: Positive for environmental allergies.  Neurological: Negative for headaches.  Psychiatric/Behavioral: The patient is not nervous/anxious.        Better than average     Social History   Tobacco Use  Smoking Status Former Smoker  . Packs/day: 1.50  . Years: 35.00  . Pack years: 52.50  . Last attempt to quit: 05/25/1997  . Years since quitting: 20.9  Smokeless Tobacco Never Used        Objective:    BP Readings from Last 3 Encounters:  04/28/18 126/62  03/22/18 (!) 130/50  03/09/18 132/62   Wt Readings from Last 3 Encounters:  04/28/18 215 lb (97.5 kg)  03/22/18 213 lb (96.6 kg)    03/09/18 215 lb 12.8 oz (97.9 kg)    BP 126/62   Pulse 70   Temp (!) 97.5 F (36.4 C)   Resp 12   Ht 5' 9.5" (1.765 m)   Wt 215 lb (97.5 kg)   SpO2 96%   BMI 31.29 kg/m    Physical Exam  Constitutional: He appears well-developed and well-nourished. No distress.  HENT:  Head: Normocephalic and atraumatic.  Right Ear: External ear normal.  Left Ear: External ear normal.  Eyes: Conjunctivae and EOM are normal. No scleral icterus.  Neck: Neck supple.  Cardiovascular: Normal rate and regular rhythm.  Murmur heard. Pulmonary/Chest: Effort normal and breath sounds normal. No respiratory distress. He has no wheezes.  Neurological: He is alert.  Skin: Skin is warm and dry. Capillary refill takes less than 2 seconds. He is not diaphoretic.          Assessment & Plan:   Problem List Items Addressed This Visit      Cardiovascular and Mediastinum   Essential hypertension    BP at goal. Meds refilled      Relevant Medications   doxazosin (CARDURA) 4 MG tablet   furosemide (LASIX) 20 MG tablet   losartan (COZAAR) 50 MG tablet   simvastatin (ZOCOR) 40 MG tablet     Digestive   GERD (gastroesophageal reflux disease) - Primary    Takes omeprazole every night->will switch to morning to be fasting  before taking. Does not typically have heartburn symptoms      Relevant Medications   omeprazole (PRILOSEC) 20 MG capsule   Esophageal cancer ([place])    Per report s/p esophagectomy. EGD 2009 with severe esophagitis. Unclear if follow-up. Will discuss at next appointment        Endocrine   Testicular dysfunction    Will continue given this enables him to remain active        OK for prn cough medication to help with sleep in setting of GERD - only taking every 5 weeks  Return in about 4 months (around 08/28/2018) for medicare wellness with nurse and then me.  Lesleigh Noe, MD

## 2018-05-30 ENCOUNTER — Other Ambulatory Visit: Payer: Self-pay | Admitting: Family Medicine

## 2018-05-30 NOTE — Telephone Encounter (Signed)
Last fill was 04/28/2018. Last office visit was 04/28/2018. Please review

## 2018-06-02 ENCOUNTER — Telehealth: Payer: Self-pay | Admitting: Family Medicine

## 2018-06-02 MED ORDER — LOSARTAN POTASSIUM 50 MG PO TABS: 50.0000 mg | ORAL_TABLET | Freq: Every day | ORAL | 3 refills | Status: DC

## 2018-06-02 MED ORDER — TESTIM 50 MG/5GM (1%) TD GEL: TRANSDERMAL | 5 refills | Status: DC

## 2018-06-02 MED ORDER — GABAPENTIN 100 MG PO CAPS: 100.0000 mg | ORAL_CAPSULE | Freq: Three times a day (TID) | ORAL | 3 refills | Status: DC

## 2018-06-02 NOTE — Telephone Encounter (Signed)
Spoke with patient. Patient was calling in regards to his medications and to make sure they are been sent in for 90 day supply with refills. Everything was sent in except Losartan, Gabapentin, and Testim. I advised patient this will be fixed. Patient also states that he would like to get another refill on phenergan codeine cough syrup just in case. He has used it 2 times in the last 3 days. He can go 2 months and not use any sometimes and sometimes he does gets spells when he has to use it more. I spoke with Dr. Einar Pheasant about this and she said that patient will need to come in if he needs a refill on this to discuss since he is a new patient to Dr. Einar Pheasant. Will discuss on his next visit in April unless he needs to come in sooner for this issue. Patient advised and agreed.

## 2018-06-02 NOTE — Telephone Encounter (Signed)
Pt called office stating he usually gets 90 day supplies on his medications and automatic refills. Pt is requesting a call from Dr.Cody's CMA.

## 2018-06-08 ENCOUNTER — Telehealth: Payer: Self-pay

## 2018-06-08 NOTE — Telephone Encounter (Signed)
PA approval for Testim received from [employer]. Coverage dates are 05/23/18-05/25/2019. Referral number [id number]

## 2018-07-18 DIAGNOSIS — H6123 Impacted cerumen, bilateral: Secondary | ICD-10-CM | POA: Diagnosis not present

## 2018-07-18 DIAGNOSIS — H7402 Tympanosclerosis, left ear: Secondary | ICD-10-CM | POA: Diagnosis not present

## 2018-07-18 DIAGNOSIS — H7291 Unspecified perforation of tympanic membrane, right ear: Secondary | ICD-10-CM | POA: Diagnosis not present

## 2018-07-19 DIAGNOSIS — N401 Enlarged prostate with lower urinary tract symptoms: Secondary | ICD-10-CM | POA: Diagnosis not present

## 2018-07-19 DIAGNOSIS — R350 Frequency of micturition: Secondary | ICD-10-CM | POA: Diagnosis not present

## 2018-07-27 ENCOUNTER — Encounter: Payer: Self-pay | Admitting: Podiatry

## 2018-07-27 ENCOUNTER — Ambulatory Visit (INDEPENDENT_AMBULATORY_CARE_PROVIDER_SITE_OTHER): Payer: Medicare HMO | Admitting: Podiatry

## 2018-07-27 DIAGNOSIS — E1140 Type 2 diabetes mellitus with diabetic neuropathy, unspecified: Secondary | ICD-10-CM | POA: Diagnosis not present

## 2018-07-27 DIAGNOSIS — B351 Tinea unguium: Secondary | ICD-10-CM

## 2018-07-27 DIAGNOSIS — M79676 Pain in unspecified toe(s): Secondary | ICD-10-CM | POA: Diagnosis not present

## 2018-07-27 NOTE — Progress Notes (Signed)
Patient ID: Gerald Martinez, male   DOB: 05/28/1933, 84 y.o.   MRN: [id number] Complaint:  Visit Type: Patient returns to my office for long thick painful nails.  His nails are painful walking and wearing his shoes. He presents for preventative foot care services.  Podiatric Exam: Vascular: dorsalis pedis  are palpable bilateral.  Posterior tibial pulses are non palpable  B/L  Capillary return is immediate. Temperature gradient is WNL. Skin turgor WNL Venous stasis noted feet and legs  B/L Sensorium: Diminished Semmes Weinstein monofilament test. Normal tactile sensation bilaterally. Nail Exam: Pt has thick disfigured discolored nails with subungual debris noted bilateral entire nail hallux through fifth toenails.     Ulcer Exam: There is no evidence of ulcer or pre-ulcerative changes or infection. Orthopedic Exam: Muscle tone and strength are WNL. No limitations in general ROM. No crepitus or effusions noted. Foot type and digits show no abnormalities. Hammer toes  B/L. Skin: No Porokeratosis. No infection or ulcers  Diagnosis:    Diabetic neuropathy. Diabetic angiopathy  Onychomycosis  B/L  Treatment & Plan Procedures and Treatment: Debridement and grinding long thick nails.  RTC 3 months.    Gerald Martinez DPM 

## 2018-07-28 DIAGNOSIS — L570 Actinic keratosis: Secondary | ICD-10-CM | POA: Diagnosis not present

## 2018-07-28 DIAGNOSIS — B079 Viral wart, unspecified: Secondary | ICD-10-CM | POA: Diagnosis not present

## 2018-08-23 ENCOUNTER — Ambulatory Visit: Payer: Medicare HMO

## 2018-09-01 ENCOUNTER — Encounter: Payer: Medicare HMO | Admitting: Family Medicine

## 2018-09-05 ENCOUNTER — Encounter: Payer: Medicare HMO | Admitting: Family Medicine

## 2018-10-10 DIAGNOSIS — R69 Illness, unspecified: Secondary | ICD-10-CM | POA: Diagnosis not present

## 2018-10-19 DIAGNOSIS — R350 Frequency of micturition: Secondary | ICD-10-CM | POA: Diagnosis not present

## 2018-10-19 DIAGNOSIS — N401 Enlarged prostate with lower urinary tract symptoms: Secondary | ICD-10-CM | POA: Diagnosis not present

## 2018-10-26 ENCOUNTER — Encounter: Payer: Self-pay | Admitting: Podiatry

## 2018-10-26 ENCOUNTER — Ambulatory Visit (INDEPENDENT_AMBULATORY_CARE_PROVIDER_SITE_OTHER): Payer: Medicare HMO | Admitting: Podiatry

## 2018-10-26 ENCOUNTER — Other Ambulatory Visit: Payer: Self-pay

## 2018-10-26 VITALS — Temp 98.1°F

## 2018-10-26 DIAGNOSIS — I714 Abdominal aortic aneurysm, without rupture, unspecified: Secondary | ICD-10-CM | POA: Insufficient documentation

## 2018-10-26 DIAGNOSIS — E1140 Type 2 diabetes mellitus with diabetic neuropathy, unspecified: Secondary | ICD-10-CM | POA: Diagnosis not present

## 2018-10-26 DIAGNOSIS — M79676 Pain in unspecified toe(s): Secondary | ICD-10-CM

## 2018-10-26 DIAGNOSIS — B351 Tinea unguium: Secondary | ICD-10-CM

## 2018-10-26 NOTE — Progress Notes (Signed)
Patient ID: Gerald Martinez, male   DOB: 1933-11-28, 85 y.o.   MRN: [id number] Complaint:  Visit Type: Patient returns to my office for long thick painful nails.  His nails are painful walking and wearing his shoes. He presents for preventative foot care services.  Podiatric Exam: Vascular: dorsalis pedis  are palpable bilateral.  Posterior tibial pulses are non palpable  B/L  Capillary return is immediate. Temperature gradient is WNL. Skin turgor WNL Venous stasis noted feet and legs  B/L Sensorium: Diminished Semmes Weinstein monofilament test. Normal tactile sensation bilaterally. Nail Exam: Pt has thick disfigured discolored nails with subungual debris noted bilateral entire nail hallux through fifth toenails.     Ulcer Exam: There is no evidence of ulcer or pre-ulcerative changes or infection. Orthopedic Exam: Muscle tone and strength are WNL. No limitations in general ROM. No crepitus or effusions noted. Foot type and digits show no abnormalities. Hammer toes  B/L. Skin: No Porokeratosis. No infection or ulcers  Diagnosis:    Diabetic neuropathy. Diabetic angiopathy  Onychomycosis  B/L  Treatment & Plan Procedures and Treatment: Debridement and grinding long thick nails.  RTC 3 months.    Gardiner Barefoot DPM

## 2018-11-15 DIAGNOSIS — R69 Illness, unspecified: Secondary | ICD-10-CM | POA: Diagnosis not present

## 2018-12-12 ENCOUNTER — Other Ambulatory Visit: Payer: Self-pay | Admitting: Family Medicine

## 2019-01-02 ENCOUNTER — Other Ambulatory Visit: Payer: Self-pay

## 2019-01-02 ENCOUNTER — Encounter: Payer: Self-pay | Admitting: Family Medicine

## 2019-01-02 ENCOUNTER — Ambulatory Visit (INDEPENDENT_AMBULATORY_CARE_PROVIDER_SITE_OTHER): Payer: Medicare HMO | Admitting: Family Medicine

## 2019-01-02 DIAGNOSIS — R21 Rash and other nonspecific skin eruption: Secondary | ICD-10-CM

## 2019-01-02 MED ORDER — FLUOCINONIDE 0.05 % EX CREA: 1.0000 "application " | TOPICAL_CREAM | Freq: Two times a day (BID) | CUTANEOUS | 0 refills | Status: DC | PRN

## 2019-01-02 NOTE — Progress Notes (Signed)
Cough medicine used about twice a month at baseline.  Med list updated/reviewed.   He had itchy patch on the R posterior calf.  Some itching in the day, more itching at night.  Worse in the last year.   He had postinflammatory hyperpigmentation/stasis dermatitis at baseline on BLE.  No clear trigger known to patient.  No FCANVD.   Locally itchy but not painful.  No blistering.   TAC 0.1% helps but doesn't resolve the lesion.    Meds, vitals, and allergies reviewed.   ROS: Per HPI unless specifically indicated in ROS section   nad ncat Bilateral stasis dermatitis/postinflammatory hyperpigmentation that appears to be chronic on the bilateral lower extremities.  He has a blanching patch of mild erythema on the right posterior calf.  Not tender.  No bands or cords.  No ulceration.  Not linear.

## 2019-01-02 NOTE — Patient Instructions (Signed)
Change to fluocinonide and use that as needed. If this isn't helping then we may need to get you over to dermatology.  Take care.  Glad to see you.  Update [place] as needed.

## 2019-01-04 NOTE — Assessment & Plan Note (Signed)
Longstanding.  Not a new issue.  No concern for DVT given the timeline.  Some better with triamcinolone but not resolved.  Okay to use fluocinonide and update [place] as needed.  See after visit summary.  Does not appear infectious.  He agrees with plan.

## 2019-01-16 DIAGNOSIS — Z961 Presence of intraocular lens: Secondary | ICD-10-CM | POA: Diagnosis not present

## 2019-01-16 DIAGNOSIS — H43811 Vitreous degeneration, right eye: Secondary | ICD-10-CM | POA: Diagnosis not present

## 2019-01-16 DIAGNOSIS — E119 Type 2 diabetes mellitus without complications: Secondary | ICD-10-CM | POA: Diagnosis not present

## 2019-01-16 DIAGNOSIS — H5315 Visual distortions of shape and size: Secondary | ICD-10-CM | POA: Diagnosis not present

## 2019-01-18 ENCOUNTER — Ambulatory Visit: Payer: Medicare HMO

## 2019-01-31 DIAGNOSIS — R69 Illness, unspecified: Secondary | ICD-10-CM | POA: Diagnosis not present

## 2019-02-08 ENCOUNTER — Ambulatory Visit (INDEPENDENT_AMBULATORY_CARE_PROVIDER_SITE_OTHER): Payer: Medicare HMO

## 2019-02-08 ENCOUNTER — Encounter: Payer: Self-pay | Admitting: Physician Assistant

## 2019-02-08 ENCOUNTER — Ambulatory Visit (INDEPENDENT_AMBULATORY_CARE_PROVIDER_SITE_OTHER): Payer: Medicare HMO | Admitting: Physician Assistant

## 2019-02-08 DIAGNOSIS — R600 Localized edema: Secondary | ICD-10-CM | POA: Diagnosis not present

## 2019-02-08 DIAGNOSIS — M25571 Pain in right ankle and joints of right foot: Secondary | ICD-10-CM

## 2019-02-08 NOTE — Progress Notes (Signed)
Office Visit Note   Patient: Gerald Martinez           Date of Birth: [birth year]           MRN: [id number] Visit Date: 02/08/2019              Requested by: Lesleigh Noe, MD [place],  [place] [address] PCP: Lesleigh Noe, MD   Assessment & Plan: Visit Diagnoses:  1. Pain in right ankle and joints of right foot   2. Bilateral lower extremity edema     Plan: We will have him obtain compression hose for his bilateral lower extremity edema he will wear these just during the day.  See him back in 2 weeks for reexamination.  Questions encouraged and answered at length.  Follow-Up Instructions: Return in about 2 weeks (around 02/22/2019).   Orders:  Orders Placed This Encounter  Procedures  . XR Ankle Complete Right   No orders of the defined types were placed in this encounter.     Procedures: No procedures performed   Clinical Data: No additional findings.   Subjective: Chief Complaint  Patient presents with  . Right Ankle - Pain    HPI Mr. Gerald Martinez 85 year old male comes in today due to right ankle pain and swelling since fall 3 weeks ago.  States really having no pain but he states that he has neuropathy in both feet.  He states that usually his ankle pain and swelling will go away after a fall but this is not going away. States he had multiple ankle injuries over the years.  Review of Systems No fevers or chills.  Objective: Vital Signs: There were no vitals taken for this visit.  Physical Exam General well-developed well-nourished male no acute distress mood and affect appropriate.  Ortho Exam Bilateral lower extremities calves are supple nontender.  He has pain edema the right ankle and right lower leg from about mid calf down.  Brawny changes of both lower extremities.  No abnormal warmth bilateral lower leg.  Thompson test negative bilaterally.  Nontender over the Achilles on the right.  Nontender over the medial lateral malleolus on the  right.  Good plantar flexion of the right ankle.  Slight decrease in dorsiflexion of the right ankle.  Specialty Comments:  No specialty comments available.  Imaging: Xr Ankle Complete Right  Result Date: 02/08/2019 Right ankle 3 views: No acute fractures.  No callus reaction.  Talus well located within the ankle mortise no diastases.  Mild arthritic changes appreciated on the lateral view.  Surgical clips medial to the distal tibial shaft.    PMFS History: Patient Active Problem List   Diagnosis Date Noted  . AAA (abdominal aortic aneurysm) ([place]) 10/26/2018  . Fatigue 09/07/2017  . Numbness 09/07/2017  . Bilateral impacted cerumen 06/15/2017  . Mixed conductive and sensorineural hearing loss of both ears 06/15/2017  . Tympanic membrane perforation, right 06/15/2017  . Low back pain 06/01/2017  . Balance problem 03/17/2017  . Cough 10/18/2016  . Esophageal cancer ([place]) 08/06/2016  . Leg ulcer ([place]) 02/25/2016  . Pseudophakia of both eyes 01/22/2016  . Vitreomacular traction syndrome of left eye 01/22/2016  . Dizziness and giddiness 05/28/2015  . Routine general medical examination at a health care facility 01/25/2014  . Allergic rhinitis 01/25/2014  . Hyponatremia 05/05/2013  . GERD (gastroesophageal reflux disease) 09/01/2011  . Encounter for long-term (current) use of other medications 01/19/2011  . Screening for prostate cancer  01/19/2011  . Overweight(278.02) 12/16/2010  . BPH (benign prostatic hyperplasia) 09/15/2010  . HYPERCHOLESTEROLEMIA 01/01/2010  . CORONARY ATHEROSCLEROSIS NATIVE CORONARY ARTERY 05/30/2009  . [place] PRESENTING HAZARDS HEALTH 05/30/2009  . RASH-NONVESICULAR 10/17/2008  . CELLULITIS AND ABSCESS OF TRUNK 09/06/2008  . Iron deficiency anemia 01/16/2008  . SHORTNESS OF BREATH 08/23/2007  . OTHER DYSPHAGIA 08/23/2007  . Testicular dysfunction 04/26/2007  . ACUTE CYSTITIS 04/19/2007  . Diabetes ([place]) 12/24/2006  . Essential hypertension  12/24/2006   Past Medical History:  Diagnosis Date  . AAA (abdominal aortic aneurysm) ([place])   . BPH (benign prostatic hypertrophy)   . CAD (coronary artery disease)    Post CABG in 2000.  LIMA to the LAD, SVG to OM, SVG to PDA, last perfusion study in 2012 with no high risk findings  . Diabetes mellitus    Type II  . Dyslipidemia   . Esophageal cancer ([place])   . Hearing loss   . Hypertension     Family History  Problem Relation Age of Onset  . Heart disease Mother   . Stroke Mother 25  . Cancer Father        Lymphoma  . Lymphoma Father   . Cancer Sister        Uncertain  . Dementia Brother   . Frontotemporal dementia Brother   . Memory loss Sister   . Cervical cancer Sister   . Lymphoma Sister   . Skin cancer Sister   . Prostate cancer Brother   . Cancer Brother        cancer of the jaw  . Colon cancer Neg Hx     Past Surgical History:  Procedure Laterality Date  . CORONARY ARTERY BYPASS GRAFT  2000  . ESOPHAGECTOMY  2000  . [place] ARTHROPLASTY  1980  . VENTRAL HERNIA REPAIR  2001   Social History   Occupational History  . Not on file  Tobacco Use  . Smoking status: Former Smoker    Packs/day: 1.50    Years: 35.00    Pack years: 52.50    Quit date: 05/25/1997    Years since quitting: 21.7  . Smokeless tobacco: Never Used  Substance and Sexual Activity  . Alcohol use: Yes    Alcohol/week: 6.0 standard drinks    Types: 6 Glasses of wine per week    Comment: small glass of wine each night  . Drug use: No  . Sexual activity: Not Currently

## 2019-02-22 ENCOUNTER — Ambulatory Visit (INDEPENDENT_AMBULATORY_CARE_PROVIDER_SITE_OTHER): Payer: Medicare HMO | Admitting: Physician Assistant

## 2019-02-22 ENCOUNTER — Encounter: Payer: Self-pay | Admitting: Physician Assistant

## 2019-02-22 DIAGNOSIS — M25571 Pain in right ankle and joints of right foot: Secondary | ICD-10-CM

## 2019-02-22 DIAGNOSIS — R269 Unspecified abnormalities of gait and mobility: Secondary | ICD-10-CM | POA: Diagnosis not present

## 2019-02-22 NOTE — Progress Notes (Signed)
HPI: Gerald Martinez returns today follow-up of his right ankle he states the ankle he overall is doing well.  He is having no mechanical symptoms.  States his swelling is greatly diminished with the compression stockings.  His main complaint today is the fact that he often feels unsteady on his feet especially whenever he first gets up.  Is not getting dizzy but this has neuropathy in his feet and also has history of lumbar degenerative disc disease with some radicular symptoms down the leg.  He is wondering what can be done to help with his gait and balance.  Review of systems: See HPI otherwise negative  Physical exam: Right ankle greatly diminished edema of the right ankle.  He has full dorsiflexion plantarflexion of the ankle.  Nontender throughout ankle.  He has brawny changes bilateral lower extremities nonpitting slight edema both legs.  Impression: Right ankle pain improved Gait/balance disturbance  Plan: Patient definitely benefit from physical therapy to work on gait and balance.  We will send in physical therapy for gait balance range of motion of the right ankle and proprioception.  He will follow-up with [place] on as-needed basis questions encouraged and answered at length

## 2019-03-07 ENCOUNTER — Ambulatory Visit: Payer: Medicare HMO

## 2019-03-09 DIAGNOSIS — R296 Repeated falls: Secondary | ICD-10-CM | POA: Diagnosis not present

## 2019-03-09 DIAGNOSIS — R262 Difficulty in walking, not elsewhere classified: Secondary | ICD-10-CM | POA: Diagnosis not present

## 2019-03-09 DIAGNOSIS — M25572 Pain in left ankle and joints of left foot: Secondary | ICD-10-CM | POA: Diagnosis not present

## 2019-03-09 DIAGNOSIS — M25571 Pain in right ankle and joints of right foot: Secondary | ICD-10-CM | POA: Diagnosis not present

## 2019-03-14 ENCOUNTER — Encounter: Payer: Medicare HMO | Admitting: Family Medicine

## 2019-03-14 ENCOUNTER — Ambulatory Visit: Payer: Medicare HMO

## 2019-03-14 DIAGNOSIS — R296 Repeated falls: Secondary | ICD-10-CM | POA: Diagnosis not present

## 2019-03-14 DIAGNOSIS — R262 Difficulty in walking, not elsewhere classified: Secondary | ICD-10-CM | POA: Diagnosis not present

## 2019-03-14 DIAGNOSIS — M25571 Pain in right ankle and joints of right foot: Secondary | ICD-10-CM | POA: Diagnosis not present

## 2019-03-14 DIAGNOSIS — M25572 Pain in left ankle and joints of left foot: Secondary | ICD-10-CM | POA: Diagnosis not present

## 2019-03-15 ENCOUNTER — Ambulatory Visit (INDEPENDENT_AMBULATORY_CARE_PROVIDER_SITE_OTHER): Payer: Medicare HMO

## 2019-03-15 DIAGNOSIS — Z0000 Encounter for general adult medical examination without abnormal findings: Secondary | ICD-10-CM

## 2019-03-15 NOTE — Progress Notes (Addendum)
Subjective:   Gerald Martinez is a 85 y.o. male who presents for Medicare Annual/Subsequent preventive examination.  Review of Systems:    This visit is being conducted through telemedicine via telephone at the nurse health advisor's home address due to the COVID-19 pandemic. This patient has given me verbal consent via doximity to conduct this visit, patient states they are participating from their home address. Patient and myself are on the telephone call. There is no referral for this visit. Some vital signs may be absent or patient reported.    Patient identification: identified by name, DOB, and current address  Cardiac Risk Factors include: advanced age (>28men, >59 women);diabetes mellitus;hypertension;male gender     Objective:    Vitals: There were no vitals taken for this visit.  There is no height or weight on file to calculate BMI.  Advanced Directives 03/15/2019 03/09/2018 03/09/2017 01/31/2016 02/27/2015 01/29/2015  Does Patient Have a Medical Advance Directive? No No No No No No  Would patient like information on creating a medical advance directive? No - Patient declined - - - No - patient declined information -    Tobacco Social History   Tobacco Use  Smoking Status Former Smoker  . Packs/day: 1.50  . Years: 35.00  . Pack years: 52.50  . Quit date: 05/25/1997  . Years since quitting: 21.8  Smokeless Tobacco Never Used     Counseling given: Not Answered   Clinical Intake:  Pre-visit preparation completed: Yes  Pain : No/denies pain     Nutritional Risks: None Diabetes: Yes CBG done?: No Did pt. bring in CBG monitor from home?: No  How often do you need to have someone help you when you read instructions, pamphlets, or other written materials from your doctor or pharmacy?: 1 - Never What is the last grade level you completed in school?: 3 years of college  Interpreter Needed?: No  Information entered by :: CJohnson, LPN  Past Medical History:   Diagnosis Date  . AAA (abdominal aortic aneurysm) ([place])   . BPH (benign prostatic hypertrophy)   . CAD (coronary artery disease)    Post CABG in 2000.  LIMA to the LAD, SVG to OM, SVG to PDA, last perfusion study in 2012 with no high risk findings  . Diabetes mellitus    Type II  . Dyslipidemia   . Esophageal cancer ([place])   . Hearing loss   . Hypertension    Past Surgical History:  Procedure Laterality Date  . CORONARY ARTERY BYPASS GRAFT  2000  . ESOPHAGECTOMY  2000  . [place] ARTHROPLASTY  1980  . VENTRAL HERNIA REPAIR  2001   Family History  Problem Relation Age of Onset  . Heart disease Mother   . Stroke Mother 52  . Cancer Father        Lymphoma  . Lymphoma Father   . Cancer Sister        Uncertain  . Dementia Brother   . Frontotemporal dementia Brother   . Memory loss Sister   . Cervical cancer Sister   . Lymphoma Sister   . Skin cancer Sister   . Prostate cancer Brother   . Cancer Brother        cancer of the jaw  . Colon cancer Neg Hx    Social History   Socioeconomic History  . Marital status: Married    Spouse name: [name]  . Number of children: 2  . Years of education: 3 years of college  .  Highest education level: Not on file  Occupational History  . Not on file  Social Needs  . Financial resource strain: Not hard at all  . Food insecurity    Worry: Never true    Inability: Never true  . Transportation needs    Medical: No    Non-medical: No  Tobacco Use  . Smoking status: Former Smoker    Packs/day: 1.50    Years: 35.00    Pack years: 52.50    Quit date: 05/25/1997    Years since quitting: 21.8  . Smokeless tobacco: Never Used  Substance and Sexual Activity  . Alcohol use: Yes    Alcohol/week: 6.0 standard drinks    Types: 6 Glasses of wine per week    Comment: small glass of wine each night  . Drug use: No  . Sexual activity: Not Currently  Lifestyle  . Physical activity    Days per week: 5 days    Minutes per session: 50 min   . Stress: Not at all  Relationships  . Social Herbalist on phone: Not on file    Gets together: Not on file    Attends religious service: Not on file    Active member of club or organization: Not on file    Attends meetings of clubs or organizations: Not on file    Relationship status: Not on file  Other Topics Concern  . Not on file  Social History Narrative   Lives with wife Tana Conch)   2 children ([name] - [place]) and [place]   Enjoys: genealogy research   Exercise: goes to [employer] - 5 days a week most weeks    Outpatient Encounter Medications as of 03/15/2019  Medication Sig  . aspirin EC 81 MG tablet Take 81 mg by mouth daily.  Marland Kitchen doxazosin (CARDURA) 4 MG tablet Take 1 tablet (4 mg total) by mouth daily.  . fluocinonide cream (LIDEX) [id number] % Apply 1 application topically 2 (two) times daily as needed.  Marland Kitchen FREESTYLE LITE test strip USE AS INSTRUCTED ONCE DAILY  . furosemide (LASIX) 20 MG tablet Take 1 tablet (20 mg total) by mouth daily.  . Lancets (FREESTYLE) lancets USE AS DIRECTED DAILY.  Marland Kitchen losartan (COZAAR) 50 MG tablet Take 1 tablet (50 mg total) by mouth daily.  . Melatonin 10 MG TABS Take 1 tablet by mouth as needed.  . metFORMIN (GLUCOPHAGE-XR) 500 MG 24 hr tablet Take 1 tablet (500 mg total) by mouth daily with breakfast.  . Multiple Vitamin (MULTIVITAMIN) tablet Take 1 tablet by mouth daily.    Marland Kitchen omeprazole (PRILOSEC) 20 MG capsule Take 1 capsule (20 mg total) by mouth daily.  . promethazine-codeine (PHENERGAN WITH CODEINE) 6.25-10 MG/5ML syrup Take 5 mLs by mouth every 8 (eight) hours as needed for cough.  . simvastatin (ZOCOR) 40 MG tablet Take 1 tablet (40 mg total) by mouth at bedtime.  . TESTIM 50 MG/5GM (1%) GEL APPLY THE CONTENTS OF 2 TUBES ONTO THE SKIN DAILY  . traZODone (DESYREL) 100 MG tablet Take 1 tablet (100 mg total) by mouth at bedtime as needed for sleep.  Marland Kitchen gabapentin (NEURONTIN) 100 MG capsule Take 1 capsule (100 mg total) by mouth 3  (three) times daily.   No facility-administered encounter medications on file as of 03/15/2019.     Activities of Daily Living In your present state of health, do you have any difficulty performing the following activities: 03/15/2019  Hearing? N  Vision? N  Difficulty concentrating or making decisions? N  Walking or climbing stairs? Y  Comment history of falls  Dressing or bathing? N  Doing errands, shopping? N  Preparing Food and eating ? N  Using the Toilet? N  In the past six months, have you accidently leaked urine? N  Do you have problems with loss of bowel control? N  Managing your Medications? N  Managing your Finances? N  Housekeeping or managing your Housekeeping? N  Some recent data might be hidden    Patient Care Team: Lesleigh Noe, MD as PCP - General (Family Medicine) Minus Breeding, MD (Cardiology) Harriet Masson, DPM as Consulting Physician (Podiatry) Irene Shipper, MD (Gastroenterology) Ralene Bathe, MD (Ophthalmology)   Assessment:   This is a routine wellness examination for [place].  Exercise Activities and Dietary recommendations Current Exercise Habits: Home exercise routine, Type of exercise: treadmill;strength training/weights, Time (Minutes): 45, Frequency (Times/Week): 5, Weekly Exercise (Minutes/Week): 225, Intensity: Moderate, Exercise limited by: None identified  Goals    . Patient Stated     03/15/2019, I will exercise more daily and start back going to the [facility].        Fall Risk Fall Risk  03/15/2019  Falls in the past year? 1  Number falls in past yr: 1  Injury with Fall? 0  Risk for fall due to : History of fall(s);Impaired balance/gait;Medication side effect  Follow up Falls evaluation completed;Falls prevention discussed   Is the patient's home free of loose throw rugs in walkways, pet beds, electrical cords, etc?   yes      Grab bars in the bathroom? yes      Handrails on the stairs?   yes      Adequate lighting?   yes   Timed Get Up and Go Performed: N/A  Depression Screen PHQ 2/9 Scores 03/15/2019  PHQ - 2 Score 0  PHQ- 9 Score 0    Cognitive Function MMSE - Mini Mental State Exam 03/15/2019  Orientation to time 5  Orientation to Place 5  Registration 3  Attention/ Calculation 5  Recall 3  Language- repeat 1        Mini Cog  Mini-Cog screen was completed. Maximum score is 22. A value of 0 denotes this part of the MMSE was not completed or the patient failed this part of the Mini-Cog screening.  Immunization History  Administered Date(s) Administered  . H1N1 05/01/2008  . Influenza Split 03/12/2011  . Influenza Whole 02/21/2008, 02/20/2009, 02/21/2010  . Influenza, High Dose Seasonal PF 03/09/2017, 01/31/2019  . Influenza,inj,Quad PF,6+ Mos 02/27/2013, 01/25/2014, 01/29/2015, 01/31/2016  . Influenza-Unspecified 01/26/2018  . Pneumococcal Conjugate-13 01/25/2014  . Pneumococcal Polysaccharide-23 12/24/2003, 01/14/2009  . Td 01/14/2009  . Zoster 01/24/2011    Qualifies for Shingles Vaccine? Yes  Screening Tests Health Maintenance  Topic Date Due  . HEMOGLOBIN A1C  09/08/2018  . TETANUS/TDAP  01/15/2019  . FOOT EXAM  10/26/2019  . OPHTHALMOLOGY EXAM  01/24/2020  . INFLUENZA VACCINE  Completed  . PNA vac Low Risk Adult  Completed   Cancer Screenings: Lung: Low Dose CT Chest recommended if Age 11-80 years, 30 pack-year currently smoking OR have quit w/in 15years. Patient does not qualify. Colorectal: no longer required  Additional Screenings:  Hepatitis C Screening: N/A      Plan:    Patient will increase exercise and start back going to the [facility] more often.   I have personally reviewed and noted the following in the  patient's chart:   . Medical and social history . Use of alcohol, tobacco or illicit drugs  . Current medications and supplements . Functional ability and status . Nutritional status . Physical activity . Advanced directives . List of other physicians  . Hospitalizations, surgeries, and ER visits in previous 12 months . Vitals . Screenings to include cognitive, depression, and falls . Referrals and appointments  In addition, I have reviewed and discussed with patient certain preventive protocols, quality metrics, and best practice recommendations. A written personalized care plan for preventive services as well as general preventive health recommendations were provided to patient.     Andrez Grime, LPN  [phone]

## 2019-03-15 NOTE — Progress Notes (Signed)
PCP notes:  Health Maintenance: Eye exam completed last month per patient. Patient states he has had his Tdap and Shingrix vaccines and will bring documentation to his upcoming physical.     Abnormal Screenings: none    Patient concerns: none    Nurse concerns: none    Next PCP appt.: 03/21/2019 @ 9 am

## 2019-03-15 NOTE — Patient Instructions (Signed)
Gerald Martinez , Thank you for taking time to come for your Medicare Wellness Visit. I appreciate your ongoing commitment to your health goals. Please review the following plan we discussed and let me know if I can assist you in the future.   Screening recommendations/referrals: Colonoscopy: no longer required Recommended yearly ophthalmology/optometry visit for glaucoma screening and checkup Recommended yearly dental visit for hygiene and checkup  Vaccinations: Influenza vaccine: up to date, completed 01/31/2019 Pneumococcal vaccine: Completed series Tdap vaccine: will bring proof of vaccination to physical  Shingles vaccine: will bring proof of vaccination to physical     Advanced directives: Advance directive discussed with you today. Even though you declined this today please call our office should you change your mind and we can give you the proper paperwork for you to fill out.  Conditions/risks identified: diabetes, hypertension  Next appointment: 03/21/2019 @ 9 am   Preventive Care 65 Years and Older, Male Preventive care refers to lifestyle choices and visits with your health care provider that can promote health and wellness. What does preventive care include?  A yearly physical exam. This is also called an annual well check.  Dental exams once or twice a year.  Routine eye exams. Ask your health care provider how often you should have your eyes checked.  Personal lifestyle choices, including:  Daily care of your teeth and gums.  Regular physical activity.  Eating a healthy diet.  Avoiding tobacco and drug use.  Limiting alcohol use.  Practicing safe sex.  Taking low doses of aspirin every day.  Taking vitamin and mineral supplements as recommended by your health care provider. What happens during an annual well check? The services and screenings done by your health care provider during your annual well check will depend on your age, overall health, lifestyle risk  factors, and family history of disease. Counseling  Your health care provider may ask you questions about your:  Alcohol use.  Tobacco use.  Drug use.  Emotional well-being.  Home and relationship well-being.  Sexual activity.  Eating habits.  History of falls.  Memory and ability to understand (cognition).  Work and work Statistician. Screening  You may have the following tests or measurements:  Height, weight, and BMI.  Blood pressure.  Lipid and cholesterol levels. These may be checked every 5 years, or more frequently if you are over 68 years old.  Skin check.  Lung cancer screening. You may have this screening every year starting at age 75 if you have a 30-pack-year history of smoking and currently smoke or have quit within the past 15 years.  Fecal occult blood test (FOBT) of the stool. You may have this test every year starting at age 36.  Flexible sigmoidoscopy or colonoscopy. You may have a sigmoidoscopy every 5 years or a colonoscopy every 10 years starting at age 4.  Prostate cancer screening. Recommendations will vary depending on your family history and other risks.  Hepatitis C blood test.  Hepatitis B blood test.  Sexually transmitted disease (STD) testing.  Diabetes screening. This is done by checking your blood sugar (glucose) after you have not eaten for a while (fasting). You may have this done every 1-3 years.  Abdominal aortic aneurysm (AAA) screening. You may need this if you are a current or former smoker.  Osteoporosis. You may be screened starting at age 74 if you are at high risk. Talk with your health care provider about your test results, treatment options, and if necessary, the need  for more tests. Vaccines  Your health care provider may recommend certain vaccines, such as:  Influenza vaccine. This is recommended every year.  Tetanus, diphtheria, and acellular pertussis (Tdap, Td) vaccine. You may need a Td booster every 10  years.  Zoster vaccine. You may need this after age 31.  Pneumococcal 13-valent conjugate (PCV13) vaccine. One dose is recommended after age 84.  Pneumococcal polysaccharide (PPSV23) vaccine. One dose is recommended after age 53. Talk to your health care provider about which screenings and vaccines you need and how often you need them. This information is not intended to replace advice given to you by your health care provider. Make sure you discuss any questions you have with your health care provider. Document Released: 06/07/2015 Document Revised: 01/29/2016 Document Reviewed: 03/12/2015 Elsevier Interactive Patient Education  2017 [place] Prevention in the Home Falls can cause injuries. They can happen to people of all ages. There are many things you can do to make your home safe and to help prevent falls. What can I do on the outside of my home?  Regularly fix the edges of walkways and driveways and fix any cracks.  Remove anything that might make you trip as you walk through a door, such as a raised step or threshold.  Trim any bushes or trees on the path to your home.  Use bright outdoor lighting.  Clear any walking paths of anything that might make someone trip, such as rocks or tools.  Regularly check to see if handrails are loose or broken. Make sure that both sides of any steps have handrails.  Any raised decks and porches should have guardrails on the edges.  Have any leaves, snow, or ice cleared regularly.  Use sand or salt on walking paths during winter.  Clean up any spills in your garage right away. This includes oil or grease spills. What can I do in the bathroom?  Use night lights.  Install grab bars by the toilet and in the tub and shower. Do not use towel bars as grab bars.  Use non-skid mats or decals in the tub or shower.  If you need to sit down in the shower, use a plastic, non-slip stool.  Keep the floor dry. Clean up any water that  spills on the floor as soon as it happens.  Remove soap buildup in the tub or shower regularly.  Attach bath mats securely with double-sided non-slip rug tape.  Do not have throw rugs and other things on the floor that can make you trip. What can I do in the bedroom?  Use night lights.  Make sure that you have a light by your bed that is easy to reach.  Do not use any sheets or blankets that are too big for your bed. They should not hang down onto the floor.  Have a firm chair that has side arms. You can use this for support while you get dressed.  Do not have throw rugs and other things on the floor that can make you trip. What can I do in the kitchen?  Clean up any spills right away.  Avoid walking on wet floors.  Keep items that you use a lot in easy-to-reach places.  If you need to reach something above you, use a strong step stool that has a grab bar.  Keep electrical cords out of the way.  Do not use floor polish or wax that makes floors slippery. If you must use wax, use  non-skid floor wax.  Do not have throw rugs and other things on the floor that can make you trip. What can I do with my stairs?  Do not leave any items on the stairs.  Make sure that there are handrails on both sides of the stairs and use them. Fix handrails that are broken or loose. Make sure that handrails are as long as the stairways.  Check any carpeting to make sure that it is firmly attached to the stairs. Fix any carpet that is loose or worn.  Avoid having throw rugs at the top or bottom of the stairs. If you do have throw rugs, attach them to the floor with carpet tape.  Make sure that you have a light switch at the top of the stairs and the bottom of the stairs. If you do not have them, ask someone to add them for you. What else can I do to help prevent falls?  Wear shoes that:  Do not have high heels.  Have rubber bottoms.  Are comfortable and fit you well.  Are closed at the  toe. Do not wear sandals.  If you use a stepladder:  Make sure that it is fully opened. Do not climb a closed stepladder.  Make sure that both sides of the stepladder are locked into place.  Ask someone to hold it for you, if possible.  Clearly mark and make sure that you can see:  Any grab bars or handrails.  First and last steps.  Where the edge of each step is.  Use tools that help you move around (mobility aids) if they are needed. These include:  Canes.  Walkers.  Scooters.  Crutches.  Turn on the lights when you go into a dark area. Replace any light bulbs as soon as they burn out.  Set up your furniture so you have a clear path. Avoid moving your furniture around.  If any of your floors are uneven, fix them.  If there are any pets around you, be aware of where they are.  Review your medicines with your doctor. Some medicines can make you feel dizzy. This can increase your chance of falling. Ask your doctor what other things that you can do to help prevent falls. This information is not intended to replace advice given to you by your health care provider. Make sure you discuss any questions you have with your health care provider. Document Released: 03/07/2009 Document Revised: 10/17/2015 Document Reviewed: 06/15/2014 Elsevier Interactive Patient Education  2017 [employer].

## 2019-03-20 DIAGNOSIS — R262 Difficulty in walking, not elsewhere classified: Secondary | ICD-10-CM | POA: Diagnosis not present

## 2019-03-20 DIAGNOSIS — R296 Repeated falls: Secondary | ICD-10-CM | POA: Diagnosis not present

## 2019-03-20 DIAGNOSIS — M25571 Pain in right ankle and joints of right foot: Secondary | ICD-10-CM | POA: Diagnosis not present

## 2019-03-20 DIAGNOSIS — M25572 Pain in left ankle and joints of left foot: Secondary | ICD-10-CM | POA: Diagnosis not present

## 2019-03-21 ENCOUNTER — Encounter: Payer: Self-pay | Admitting: Internal Medicine

## 2019-03-21 ENCOUNTER — Other Ambulatory Visit: Payer: Self-pay

## 2019-03-21 ENCOUNTER — Encounter: Payer: Medicare HMO | Admitting: Family Medicine

## 2019-03-21 ENCOUNTER — Ambulatory Visit (INDEPENDENT_AMBULATORY_CARE_PROVIDER_SITE_OTHER): Payer: Medicare HMO | Admitting: Family Medicine

## 2019-03-21 VITALS — BP 136/78 | HR 63 | Temp 97.7°F | Resp 16 | Ht 70.75 in | Wt 212.0 lb

## 2019-03-21 DIAGNOSIS — I10 Essential (primary) hypertension: Secondary | ICD-10-CM

## 2019-03-21 DIAGNOSIS — D509 Iron deficiency anemia, unspecified: Secondary | ICD-10-CM

## 2019-03-21 DIAGNOSIS — E1159 Type 2 diabetes mellitus with other circulatory complications: Secondary | ICD-10-CM | POA: Diagnosis not present

## 2019-03-21 DIAGNOSIS — Z5181 Encounter for therapeutic drug level monitoring: Secondary | ICD-10-CM | POA: Diagnosis not present

## 2019-03-21 DIAGNOSIS — E7800 Pure hypercholesterolemia, unspecified: Secondary | ICD-10-CM | POA: Diagnosis not present

## 2019-03-21 DIAGNOSIS — E299 Testicular dysfunction, unspecified: Secondary | ICD-10-CM

## 2019-03-21 DIAGNOSIS — D696 Thrombocytopenia, unspecified: Secondary | ICD-10-CM | POA: Diagnosis not present

## 2019-03-21 DIAGNOSIS — Z79890 Hormone replacement therapy (postmenopausal): Secondary | ICD-10-CM | POA: Diagnosis not present

## 2019-03-21 DIAGNOSIS — G4700 Insomnia, unspecified: Secondary | ICD-10-CM | POA: Diagnosis not present

## 2019-03-21 DIAGNOSIS — C159 Malignant neoplasm of esophagus, unspecified: Secondary | ICD-10-CM

## 2019-03-21 LAB — COMPREHENSIVE METABOLIC PANEL
ALT: 21 U/L (ref 0–53)
AST: 25 U/L (ref 0–37)
Albumin: 4.4 g/dL (ref 3.5–5.2)
Alkaline Phosphatase: 65 U/L (ref 39–117)
BUN: 11 mg/dL (ref 6–23)
CO2: 34 mEq/L — ABNORMAL HIGH (ref 19–32)
Calcium: 9.3 mg/dL (ref 8.4–10.5)
Chloride: 99 mEq/L (ref 96–112)
Creatinine, Ser: 0.89 mg/dL (ref 0.40–1.50)
GFR: 81.64 mL/min (ref >60.00)
Glucose, Bld: 128 mg/dL — ABNORMAL HIGH (ref 70–99)
Potassium: 4.6 mEq/L (ref 3.5–5.1)
Sodium: 137 mEq/L (ref 135–145)
Total Bilirubin: 0.8 mg/dL (ref 0.2–1.2)
Total Protein: 7 g/dL (ref 6.0–8.3)

## 2019-03-21 LAB — LIPID PANEL
Cholesterol: 112 mg/dL (ref 0–200)
HDL: 42.5 mg/dL (ref >39.00)
LDL Cholesterol: 51 mg/dL (ref 0–99)
NonHDL: 69.72
Total CHOL/HDL Ratio: 3
Triglycerides: 95 mg/dL (ref 0.0–149.0)
VLDL: 19 mg/dL (ref 0.0–40.0)

## 2019-03-21 LAB — HEMOGLOBIN A1C: Hgb A1c MFr Bld: 7.2 % — ABNORMAL HIGH (ref 4.6–6.5)

## 2019-03-21 LAB — CBC
HCT: 43.3 % (ref 39.0–52.0)
Hemoglobin: 14 g/dL (ref 13.0–17.0)
MCHC: 32.2 g/dL (ref 30.0–36.0)
MCV: 79.2 fl (ref 78.0–100.0)
Platelets: 141 10*3/uL — ABNORMAL LOW (ref 150.0–400.0)
RBC: 5.47 Mil/uL (ref 4.22–5.81)
RDW: 19.8 % — ABNORMAL HIGH (ref 11.5–15.5)
WBC: 5.6 10*3/uL (ref 4.0–10.5)

## 2019-03-21 NOTE — Progress Notes (Signed)
Annual Exam   Chief Complaint:  Chief Complaint  Patient presents with  . Annual Exam    part 2     History of Present Illness:  Gerald Martinez is a 85 y.o. presents today for annual examination.     Going to physical rehab in [place] Has had several falls at home Getting ready to go back to the [facility]  #testicular dysfunction - testim gel, helps with energy - gets him to exercise - pharmacist discussed switching to shot as cheaper alternative  #Diabetes #HLD #HTN - taking medication - stable conditions  #Hx of barrett's s/p surgery - would like to see GI to follow-up  Social History   Tobacco Use  Smoking Status Former Smoker  . Packs/day: 1.50  . Years: 35.00  . Pack years: 52.50  . Quit date: 05/25/1997  . Years since quitting: 21.8  Smokeless Tobacco Never Used   Social History   Substance and Sexual Activity  Alcohol Use Yes  . Alcohol/week: 6.0 standard drinks  . Types: 6 Glasses of wine per week   Comment: small glass of wine each night   Social History   Substance and Sexual Activity  Drug Use No     Safety The patient wears seatbelts: yes.     The patient feels safe at home and in their relationships: yes.  General Health Dentist in the last year: Yes Eye doctor: yes  Weight Wt Readings from Last 3 Encounters:  03/21/19 212 lb (96.2 kg)  01/02/19 218 lb 8 oz (99.1 kg)  04/28/18 215 lb (97.5 kg)   Patient has high BMI  BMI Readings from Last 1 Encounters:  03/21/19 29.78 kg/m     Chronic disease screening Blood pressure monitoring:  BP Readings from Last 3 Encounters:  03/21/19 136/78  01/02/19 (!) 156/74  04/28/18 126/62    Lipid Monitoring: Indication for screening: age >35, obesity, diabetes, family hx, CV risk factors.  Lipid screening: Yes  Lab Results  Component Value Date   CHOL 124 03/09/2018   HDL 40.70 03/09/2018   LDLCALC 63 03/09/2017   LDLDIRECT 69.0 03/09/2018   TRIG 214.0 (H) 03/09/2018   CHOLHDL 3  03/09/2018    With diabetes on medication  Lab Results  Component Value Date   HGBA1C 6.3 (A) 03/09/2018     Prostate Cancer Screening: Not Indicated   Inadequate evidence for screening <55 No mortality benefit for screening >70     Abdominal Aortic Aneurysm: known hx on ASA and statin  Immunization History  Administered Date(s) Administered  . H1N1 05/01/2008  . Influenza Split 03/12/2011  . Influenza Whole 02/21/2008, 02/20/2009, 02/21/2010  . Influenza, High Dose Seasonal PF 03/09/2017, 01/31/2019  . Influenza,inj,Quad PF,6+ Mos 02/27/2013, 01/25/2014, 01/29/2015, 01/31/2016  . Influenza-Unspecified 01/26/2018  . Pneumococcal Conjugate-13 01/25/2014  . Pneumococcal Polysaccharide-23 12/24/2003, 01/14/2009  . Td 01/14/2009  . Zoster 01/24/2011    Past Medical History:  Diagnosis Date  . AAA (abdominal aortic aneurysm) ([place])   . BPH (benign prostatic hypertrophy)   . CAD (coronary artery disease)    Post CABG in 2000.  LIMA to the LAD, SVG to OM, SVG to PDA, last perfusion study in 2012 with no high risk findings  . Diabetes mellitus    Type II  . Dyslipidemia   . Esophageal cancer ([place])   . Hearing loss   . Hypertension     Past Surgical History:  Procedure Laterality Date  . CORONARY ARTERY BYPASS GRAFT  2000  .  ESOPHAGECTOMY  2000  . [place] ARTHROPLASTY  1980  . VENTRAL HERNIA REPAIR  2001    Prior to Admission medications   Medication Sig Start Date End Date Taking? Authorizing Provider  aspirin EC 81 MG tablet Take 81 mg by mouth daily.   Yes [provider]  doxazosin (CARDURA) 4 MG tablet Take 1 tablet (4 mg total) by mouth daily. 04/28/18  Yes Lesleigh Noe, MD  fluocinonide cream (LIDEX) [id number] % Apply 1 application topically 2 (two) times daily as needed. 01/02/19  Yes Tonia Ghent, MD  FREESTYLE LITE test strip USE AS INSTRUCTED ONCE DAILY 04/27/16  Yes Renato Shin, MD  furosemide (LASIX) 20 MG tablet Take 1 tablet (20 mg  total) by mouth daily. 04/28/18  Yes Lesleigh Noe, MD  Lancets (FREESTYLE) lancets USE AS DIRECTED DAILY. 04/27/16  Yes Renato Shin, MD  losartan (COZAAR) 50 MG tablet Take 1 tablet (50 mg total) by mouth daily. 06/02/18  Yes Lesleigh Noe, MD  Melatonin 10 MG TABS Take 1 tablet by mouth as needed.   Yes [provider]  metFORMIN (GLUCOPHAGE-XR) 500 MG 24 hr tablet Take 1 tablet (500 mg total) by mouth daily with breakfast. 04/28/18  Yes Lesleigh Noe, MD  Multiple Vitamin (MULTIVITAMIN) tablet Take 1 tablet by mouth daily.     Yes [provider]  omeprazole (PRILOSEC) 20 MG capsule Take 1 capsule (20 mg total) by mouth daily. 04/28/18  Yes Lesleigh Noe, MD  promethazine-codeine [facility] WITH CODEINE) 6.25-10 MG/5ML syrup Take 5 mLs by mouth every 8 (eight) hours as needed for cough. 04/28/18  Yes Lesleigh Noe, MD  simvastatin (ZOCOR) 40 MG tablet Take 1 tablet (40 mg total) by mouth at bedtime. 04/28/18  Yes Lesleigh Noe, MD  TESTIM 50 MG/5GM (1%) GEL APPLY THE CONTENTS OF 2 TUBES ONTO THE SKIN DAILY 12/12/18  Yes Lesleigh Noe, MD  traZODone (DESYREL) 100 MG tablet Take 1 tablet (100 mg total) by mouth at bedtime as needed for sleep. 01/31/16  Yes Renato Shin, MD    Allergies  Allergen Reactions  . Propoxyphene N-Acetaminophen     Not sure if this is accurate per patient. Not sure     Social History   Socioeconomic History  . Marital status: Married    Spouse name: [name]  . Number of children: 2  . Years of education: 3 years of college  . Highest education level: Not on file  Occupational History  . Not on file  Social Needs  . Financial resource strain: Not hard at all  . Food insecurity    Worry: Never true    Inability: Never true  . Transportation needs    Medical: No    Non-medical: No  Tobacco Use  . Smoking status: Former Smoker    Packs/day: 1.50    Years: 35.00    Pack years: 52.50    Quit date: 05/25/1997    Years since quitting:  21.8  . Smokeless tobacco: Never Used  Substance and Sexual Activity  . Alcohol use: Yes    Alcohol/week: 6.0 standard drinks    Types: 6 Glasses of wine per week    Comment: small glass of wine each night  . Drug use: No  . Sexual activity: Not Currently  Lifestyle  . Physical activity    Days per week: 5 days    Minutes per session: 50 min  . Stress: Not at all  Relationships  .  Social Herbalist on phone: Not on file    Gets together: Not on file    Attends religious service: Not on file    Active member of club or organization: Not on file    Attends meetings of clubs or organizations: Not on file    Relationship status: Not on file  . Intimate partner violence    Fear of current or ex partner: No    Emotionally abused: No    Physically abused: No    Forced sexual activity: No  Other Topics Concern  . Not on file  Social History Narrative   Lives with wife Tana Conch)   2 children ([name] - [place]) and [place]   Enjoys: genealogy research   Exercise: goes to [employer] - 5 days a week most weeks    Family History  Problem Relation Age of Onset  . Heart disease Mother   . Stroke Mother 77  . Cancer Father        Lymphoma  . Lymphoma Father   . Cancer Sister        Uncertain  . Dementia Brother   . Frontotemporal dementia Brother   . Memory loss Sister   . Cervical cancer Sister   . Lymphoma Sister   . Skin cancer Sister   . Prostate cancer Brother   . Cancer Brother        cancer of the jaw  . Colon cancer Neg Hx     Review of Systems  Constitutional: Negative.   HENT: Negative.   Eyes: Negative.   Respiratory: Negative.   Cardiovascular: Negative.   Gastrointestinal: Negative.   Genitourinary: Negative.   Musculoskeletal: Positive for falls (in PT).  Skin: Negative.   Neurological:       Balance issues  Endo/Heme/Allergies: Negative.   Psychiatric/Behavioral: Negative.      Physical Exam BP 136/78   Pulse 63   Temp 97.7 F  (36.5 C)   Resp 16   Ht 5' 10.75" (1.797 m)   Wt 212 lb (96.2 kg)   SpO2 98%   BMI 29.78 kg/m    BP Readings from Last 3 Encounters:  03/21/19 136/78  01/02/19 (!) 156/74  04/28/18 126/62      Physical Exam Constitutional:      General: He is not in acute distress.    Appearance: He is well-developed. He is not diaphoretic.  HENT:     Head: Normocephalic and atraumatic.     Right Ear: Tympanic membrane and ear canal normal.     Left Ear: Ear canal normal. Tympanic membrane is scarred.     Nose: Nose normal.     Mouth/Throat:     Pharynx: Uvula midline.  Eyes:     General: No scleral icterus.    Conjunctiva/sclera: Conjunctivae normal.     Pupils: Pupils are equal, round, and reactive to light.  Neck:     Musculoskeletal: Normal range of motion and neck supple.  Cardiovascular:     Rate and Rhythm: Normal rate and regular rhythm.     Heart sounds: Murmur present.  Pulmonary:     Effort: Pulmonary effort is normal. No respiratory distress.     Breath sounds: Normal breath sounds. No wheezing.  Abdominal:     General: There is no distension.     Palpations: Abdomen is soft. There is no mass.     Tenderness: There is no abdominal tenderness. There is no guarding.  Musculoskeletal:  Normal range of motion.     Comments: Ambulating with 2 canes, did not want to step up to exam table. Otherwise normal LE strength testing  Lymphadenopathy:     Cervical: No cervical adenopathy.  Skin:    General: Skin is warm and dry.     Capillary Refill: Capillary refill takes less than 2 seconds.  Neurological:     Mental Status: He is alert and oriented to person, place, and time.  Psychiatric:        Mood and Affect: Mood normal.        Results:  PHQ-9:    Clinical Support from 03/15/2019 in [place] at [facility] Total Score  0        Assessment: 85 y.o. here for routine annual physical examination.  Plan: Problem List Items Addressed This Visit       Cardiovascular and Mediastinum   Essential hypertension   Relevant Orders   Comprehensive metabolic panel     Digestive   Esophageal cancer ([place])    Would like screening for cancer given hx.       Relevant Orders   Ambulatory referral to Gastroenterology     Endocrine   Diabetes [facility]) - Primary   Relevant Orders   Hemoglobin A1c   Lipid panel   Testicular dysfunction     Other   HYPERCHOLESTEROLEMIA   Relevant Orders   Lipid panel   Comprehensive metabolic panel    Other Visit Diagnoses    Encounter for monitoring testosterone replacement therapy       Relevant Orders   CBC      Screening: -- Blood pressure screen normal -- cholesterol screening: will obtain -- Weight screening: overweight: continue to monitor -- Diabetes Screening: will obtain -- Nutrition: normal  The ASCVD Risk score Mikey Bussing DC Jr., et al., 2013) failed to calculate for the following reasons:   The 2013 ASCVD risk score is only valid for ages 69 to 12  -- ASA 81 mg discussed if CVD risk >10% age 72-59 and willing to take for 10 years -- Statin therapy for Age 60-75 with CVD risk >7.5%  Psych -- Depression screening (PHQ-9): negative  Safety -- tobacco screening: not using -- alcohol screening:  low-risk usage. -- no evidence of domestic violence or intimate partner violence.  Cancer Screening -- Prostate (age 21-69) not indicated -- Colon not indicated -- Lung not indicated   Immunizations -- flu vaccine up to date -- TDAP q10 years advised getting at the pharmacy and updating [place] -- Shingles (age >75) up to date -- PPSV-23 (19-64 with chronic disease or smoking) up to date -- PCV-13 (age >52) - one dose followed by PPSV-23 1 year later up to date   #Insomnia - only when traveling - melatonin regularly - trazodone if traveling   [place]

## 2019-03-21 NOTE — Patient Instructions (Addendum)
Got to the pharmacy to get your Tetanus Shot  Lab work today will get results in [employer]

## 2019-03-21 NOTE — Addendum Note (Signed)
Addended by: Lesleigh Noe on: 03/21/2019 05:34 PM   Modules accepted: Orders

## 2019-03-21 NOTE — Assessment & Plan Note (Signed)
Would like screening for cancer given hx.

## 2019-03-23 DIAGNOSIS — M25571 Pain in right ankle and joints of right foot: Secondary | ICD-10-CM | POA: Diagnosis not present

## 2019-03-23 DIAGNOSIS — M25572 Pain in left ankle and joints of left foot: Secondary | ICD-10-CM | POA: Diagnosis not present

## 2019-03-23 DIAGNOSIS — R296 Repeated falls: Secondary | ICD-10-CM | POA: Diagnosis not present

## 2019-03-23 DIAGNOSIS — R262 Difficulty in walking, not elsewhere classified: Secondary | ICD-10-CM | POA: Diagnosis not present

## 2019-03-27 DIAGNOSIS — E785 Hyperlipidemia, unspecified: Secondary | ICD-10-CM | POA: Insufficient documentation

## 2019-03-27 NOTE — Progress Notes (Signed)
HPI The patient presents for followup of his known coronary disease. Since I last saw him he has done well.  He started walking with a walker and did some physical therapy for his balance.  He was very sad when the [facility] was closed and now it is back open and he is working out.  He denies any cardiovascular symptoms.  In particular he does not notice any palpitations, presyncope or syncope.  He has had no new shortness of breath, PND or orthopnea.  He has had no weight gain or edema.  He has had stress.  Allergies  Allergen Reactions  . Propoxyphene N-Acetaminophen     Not sure if this is accurate per patient. Not sure    Current Outpatient Medications  Medication Sig Dispense Refill  . doxazosin (CARDURA) 4 MG tablet Take 1 tablet (4 mg total) by mouth daily. 90 tablet 2  . fluocinonide cream (LIDEX) [id number] % Apply 1 application topically 2 (two) times daily as needed. 30 g 0  . FREESTYLE LITE test strip USE AS INSTRUCTED ONCE DAILY 90 each 3  . furosemide (LASIX) 20 MG tablet Take 1 tablet (20 mg total) by mouth daily. 90 tablet 2  . Lancets (FREESTYLE) lancets USE AS DIRECTED DAILY. 100 each PRN  . losartan (COZAAR) 50 MG tablet Take 1 tablet (50 mg total) by mouth daily. 90 tablet 3  . Melatonin 10 MG TABS Take 1 tablet by mouth as needed.    . metFORMIN (GLUCOPHAGE-XR) 500 MG 24 hr tablet Take 1 tablet (500 mg total) by mouth daily with breakfast. 90 tablet 4  . Multiple Vitamin (MULTIVITAMIN) tablet Take 1 tablet by mouth daily.      Marland Kitchen omeprazole (PRILOSEC) 20 MG capsule Take 1 capsule (20 mg total) by mouth daily. 90 capsule 3  . promethazine-codeine (PHENERGAN WITH CODEINE) 6.25-10 MG/5ML syrup Take 5 mLs by mouth every 8 (eight) hours as needed for cough. 120 mL 0  . simvastatin (ZOCOR) 40 MG tablet Take 1 tablet (40 mg total) by mouth at bedtime. 90 tablet 3  . TESTIM 50 MG/5GM (1%) GEL APPLY THE CONTENTS OF 2 TUBES ONTO THE SKIN DAILY 300 g 5  . traZODone (DESYREL) 100 MG  tablet Take 1 tablet (100 mg total) by mouth at bedtime as needed for sleep. 30 tablet 3  . apixaban (ELIQUIS) 5 MG TABS tablet Take 1 tablet (5 mg total) by mouth 2 (two) times daily. 180 tablet 1   No current facility-administered medications for this visit.     Past Medical History:  Diagnosis Date  . AAA (abdominal aortic aneurysm) ([place])   . BPH (benign prostatic hypertrophy)   . CAD (coronary artery disease)    Post CABG in 2000.  LIMA to the LAD, SVG to OM, SVG to PDA, last perfusion study in 2012 with no high risk findings  . Diabetes mellitus    Type II  . Dyslipidemia   . Esophageal cancer ([place])   . Hearing loss   . Hypertension     Past Surgical History:  Procedure Laterality Date  . CORONARY ARTERY BYPASS GRAFT  2000  . ESOPHAGECTOMY  2000  . [place] ARTHROPLASTY  1980  . VENTRAL HERNIA REPAIR  2001    ROS:   As stated in the HPI and negative for all other systems.  PHYSICAL EXAM BP (!) 152/86   Pulse 73   Temp (!) 97.2 F (36.2 C)   Ht 5' 10.75" (1.797  m)   Wt 212 lb (96.2 kg)   SpO2 95%   BMI 29.78 kg/m   GENERAL:  Well appearing NECK:  No jugular venous distention, waveform within normal limits, carotid upstroke brisk and symmetric, no bruits, no thyromegaly LUNGS:  Clear to auscultation bilaterally CHEST:  Unremarkable HEART:  PMI not displaced or sustained,S1 and S2 within normal limits, no S3, no S4, no clicks, no rubs, no murmurs ABD:  Flat, positive bowel sounds normal in frequency in pitch, no bruits, no rebound, no guarding, no midline pulsatile mass, no hepatomegaly, no splenomegaly EXT:  2 plus pulses throughout, no edema, no cyanosis no clubbing   EKG: Atrial flutter, ventricular rate 64, incomplete right bundle branch block, right axis deviation, flutter new compared to previous.  Lab Results  Component Value Date   CHOL 112 03/21/2019   TRIG 95.0 03/21/2019   HDL 42.50 03/21/2019   LDLCALC 51 03/21/2019   LDLDIRECT 69.0 03/09/2018    Lab Results  Component Value Date   HGBA1C 7.2 (H) 03/21/2019    ASSESSMENT AND PLAN  ATRIAL FLUTTER:   Mr. Gerald Martinez has a CHA2DS2 - VASc score of 5 this is a new rhythm.  He is not feeling it.  He can stop his aspirin and start Eliquis.  We will put a 3-day Holter on him to see if this is persistent.  He might not want to consider ablation but might consider cardioversion although I do not know if there will be a significant advantage..    Of note he said no obvious contraindications anticoagulation.  CBC was recently normal.  Renal function is normal.  CORONARY ATHEROSCLEROSIS NATIVE CORONARY ARTERY - He had no new symptoms.  No change in therapy.  No further cardiovascular testing.   HYPERTENSION -  The blood pressure is mildly elevated which is unusual.  He can keep a blood pressure diary.   HYPERCHOLESTEROLEMIA -  He had an excellent lipid profile.  No change in therapy.    DM - His A1C was mildly elevated.  However, it was 6.3 most recently.  He is now back exercising and changing his diet and would prefer not to try another medication.  I think he can bring this back down below 7.  If not he might be a candidate for SGLT2 inhibitor or GLP-1 receptor antagonist.  AAA - He had stable repair 4 years ago.  No change in therapy.

## 2019-03-28 ENCOUNTER — Encounter: Payer: Self-pay | Admitting: Cardiology

## 2019-03-28 ENCOUNTER — Other Ambulatory Visit: Payer: Self-pay

## 2019-03-28 ENCOUNTER — Ambulatory Visit (INDEPENDENT_AMBULATORY_CARE_PROVIDER_SITE_OTHER): Payer: Medicare HMO | Admitting: Cardiology

## 2019-03-28 VITALS — BP 152/86 | HR 73 | Temp 97.2°F | Ht 70.75 in | Wt 212.0 lb

## 2019-03-28 DIAGNOSIS — I2510 Atherosclerotic heart disease of native coronary artery without angina pectoris: Secondary | ICD-10-CM | POA: Diagnosis not present

## 2019-03-28 DIAGNOSIS — I10 Essential (primary) hypertension: Secondary | ICD-10-CM | POA: Diagnosis not present

## 2019-03-28 DIAGNOSIS — I4892 Unspecified atrial flutter: Secondary | ICD-10-CM

## 2019-03-28 DIAGNOSIS — I483 Typical atrial flutter: Secondary | ICD-10-CM | POA: Diagnosis not present

## 2019-03-28 DIAGNOSIS — I714 Abdominal aortic aneurysm, without rupture, unspecified: Secondary | ICD-10-CM

## 2019-03-28 DIAGNOSIS — E785 Hyperlipidemia, unspecified: Secondary | ICD-10-CM

## 2019-03-28 DIAGNOSIS — E118 Type 2 diabetes mellitus with unspecified complications: Secondary | ICD-10-CM

## 2019-03-28 MED ORDER — APIXABAN 5 MG PO TABS: 5.0000 mg | ORAL_TABLET | Freq: Two times a day (BID) | ORAL | 1 refills | Status: DC

## 2019-03-28 NOTE — Patient Instructions (Addendum)
Medication Instructions:  Your physician has recommended you make the following change in your medication:   STOP TAKING YOUR ASPIRIN   START APIXABAN (ELIQUIS) 5 MG BY MOUTH TWICE A DAY  *If you need a refill on your cardiac medications before your next appointment, please call your pharmacy*  Lab Work: NONE If you have labs (blood work) drawn today and your tests are completely normal, you will receive your results only by: Marland Kitchen MyChart Message (if you have MyChart) OR . A paper copy in the mail If you have any lab test that is abnormal or we need to change your treatment, we will call you to review the results.  Testing/Procedures: Your physician has recommended that you wear a holter monitor for 3 days. Holter monitors are medical devices that record the heart's electrical activity. Doctors most often use these monitors to diagnose arrhythmias. Arrhythmias are problems with the speed or rhythm of the heartbeat. The monitor is a small, portable device. You can wear one while you do your normal daily activities. This is usually used to diagnose what is causing palpitations/syncope (passing out). YOU WILL BE CONTACTED BY A NURSE FROM OUR [address] LOCATION REGARDING YOUR MONITOR ([address], [place], [place] [address])   Follow-Up: At [facility], you and your health needs are our priority.  As part of our continuing mission to provide you with exceptional heart care, we have created designated Provider Care Teams.  These Care Teams include your primary Cardiologist (physician) and Advanced Practice Providers (APPs -  Physician Assistants and Nurse Practitioners) who all work together to provide you with the care you need, when you need it. YOU WILL NEED A follow up with Dr. Percival Spanish after you return your Holter monitor   The format for your next appointment:   In Person  Provider:   Minus Breeding, MD  Other Instructions [place] DR.  [place]

## 2019-03-29 ENCOUNTER — Ambulatory Visit (INDEPENDENT_AMBULATORY_CARE_PROVIDER_SITE_OTHER): Payer: Medicare HMO | Admitting: Podiatry

## 2019-03-29 ENCOUNTER — Encounter: Payer: Self-pay | Admitting: Podiatry

## 2019-03-29 DIAGNOSIS — E1140 Type 2 diabetes mellitus with diabetic neuropathy, unspecified: Secondary | ICD-10-CM | POA: Diagnosis not present

## 2019-03-29 DIAGNOSIS — B351 Tinea unguium: Secondary | ICD-10-CM

## 2019-03-29 DIAGNOSIS — M79676 Pain in unspecified toe(s): Secondary | ICD-10-CM | POA: Diagnosis not present

## 2019-03-29 NOTE — Progress Notes (Signed)
Patient ID: Gerald Martinez, male   DOB: 05/11/1934, 84 y.o.   MRN: [id number] Complaint:  Visit Type: Patient returns to my office for long thick painful nails.  His nails are painful walking and wearing his shoes. He presents for preventative foot care services.  Podiatric Exam: Vascular: dorsalis pedis  are palpable bilateral.  Posterior tibial pulses are non palpable  B/L  Capillary return is immediate. Temperature gradient is WNL. Skin turgor WNL Venous stasis noted feet and legs  B/L Sensorium: Diminished Semmes Weinstein monofilament test. Normal tactile sensation bilaterally. Nail Exam: Pt has thick disfigured discolored nails with subungual debris noted bilateral entire nail hallux through fifth toenails.     Ulcer Exam: There is no evidence of ulcer or pre-ulcerative changes or infection. Orthopedic Exam: Muscle tone and strength are WNL. No limitations in general ROM. No crepitus or effusions noted. Foot type and digits show no abnormalities. Hammer toes  B/L. Skin: No Porokeratosis. No infection or ulcers  Diagnosis:    Diabetic neuropathy. Diabetic angiopathy  Onychomycosis  B/L  Treatment & Plan Procedures and Treatment: Debridement and grinding long thick nails.  RTC 3 months.    Gerald Martinez DPM 

## 2019-03-30 ENCOUNTER — Telehealth: Payer: Self-pay | Admitting: *Deleted

## 2019-03-30 NOTE — Telephone Encounter (Signed)
Unfortunately, I am working remotely from home and do not have access to a fax machine.  The [employer] does enclose full instructions in the monitor kit being mailed to your home.  They may be able to fax you additional information.  [employer] telephone # is [phone].

## 2019-03-30 NOTE — Telephone Encounter (Signed)
3 day ZIO XT long term holter monitor to be mailed to the patients home.  Instructions reviewed briefly as they are included in the monitor kit. 

## 2019-03-30 NOTE — Telephone Encounter (Signed)
Follow Up:     Please call wife first. Pt would like for you if possible to fax over the instructions for his Monitor please. Please fax to [phone].

## 2019-04-03 ENCOUNTER — Ambulatory Visit (INDEPENDENT_AMBULATORY_CARE_PROVIDER_SITE_OTHER): Payer: Medicare HMO

## 2019-04-03 DIAGNOSIS — I4892 Unspecified atrial flutter: Secondary | ICD-10-CM

## 2019-04-10 ENCOUNTER — Other Ambulatory Visit: Payer: Self-pay | Admitting: Family Medicine

## 2019-04-10 DIAGNOSIS — R69 Illness, unspecified: Secondary | ICD-10-CM | POA: Diagnosis not present

## 2019-04-10 NOTE — Telephone Encounter (Signed)
Faxed refill request. Fluocinonide Cream Last office visit:   03/21/2019 Last Filled:    30 g 0 01/02/2019  Please advise.

## 2019-04-12 ENCOUNTER — Other Ambulatory Visit: Payer: Self-pay | Admitting: Endocrinology

## 2019-04-12 NOTE — Telephone Encounter (Signed)
Please advise 

## 2019-04-12 NOTE — Telephone Encounter (Signed)
Per Dr. Ellison's request, I am forwarding this refill request. Please review and refill if appropriate.  

## 2019-04-12 NOTE — Telephone Encounter (Signed)
Please forward refill request to pt's primary care provider.   

## 2019-04-14 DIAGNOSIS — I4892 Unspecified atrial flutter: Secondary | ICD-10-CM | POA: Diagnosis not present

## 2019-04-15 ENCOUNTER — Telehealth: Payer: Self-pay | Admitting: Cardiology

## 2019-04-15 NOTE — Telephone Encounter (Signed)
    Contacted by I rhythm monitoring company regarding patient's 3-day Zio patch monitor.  Reports that the patient had one brief episode of slow atrial flutter with a rate of 35 bpm for 1 minute duration, one episode of a 3-second pause and was in atrial flutter for 100% of the 3-day monitor duration.  Patient not currently on AV blocking medications.  Has follow-up with Dr. Percival Spanish on 04/27/2019.  Will forward this message with results to him for review and further work-up.  Per chart review, patient was last seen by Dr. Percival Spanish on 03/28/2019.  There was discussion for possible DCCV versus AF ablation.  He has been on Eliquis 5 mg twice daily.    Kathyrn Drown NP-[facility] [place] Pager: [phone]

## 2019-04-17 NOTE — Telephone Encounter (Signed)
OK to keep follow up.

## 2019-04-26 ENCOUNTER — Ambulatory Visit: Payer: Medicare HMO | Admitting: Internal Medicine

## 2019-04-26 NOTE — Progress Notes (Signed)
Cardiology Office Note   Date:  04/27/2019   ID:  Gerald Martinez, DOB [birth year], MRN [id number]  PCP:  Lesleigh Noe, MD  Cardiologist:   No primary care provider on file.  Chief Complaint  Patient presents with  . Atrial Flutter      History of Present Illness: Gerald Martinez is a 85 y.o. male who presents for follow up of atrial flutter.  He had good rate control on a monitor recently.  He has been on Eliquis.   Since I last saw him he has done okay.  He does not really feel any tachypalpitations.  He has tolerated the blood thinner.  He denies any cardiovascular symptoms such as presyncope or syncope.  He gets around with a walker.  He denies any chest discomfort, neck or arm discomfort.  He has had no new shortness of breath, PND or orthopnea.  He has had no weight gain or edema.  Of note he has had a lot of stress as he is taking care of his brother no complex social situation.   Past Medical History:  Diagnosis Date  . AAA (abdominal aortic aneurysm) ([place])   . BPH (benign prostatic hypertrophy)   . CAD (coronary artery disease)    Post CABG in 2000.  LIMA to the LAD, SVG to OM, SVG to PDA, last perfusion study in 2012 with no high risk findings  . Diabetes mellitus    Type II  . Dyslipidemia   . Esophageal cancer ([place])   . Hearing loss   . Hypertension     Past Surgical History:  Procedure Laterality Date  . CORONARY ARTERY BYPASS GRAFT  2000  . ESOPHAGECTOMY  2000  . [place] ARTHROPLASTY  1980  . VENTRAL HERNIA REPAIR  2001     Current Outpatient Medications  Medication Sig Dispense Refill  . apixaban (ELIQUIS) 5 MG TABS tablet Take 1 tablet (5 mg total) by mouth 2 (two) times daily. 180 tablet 1  . doxazosin (CARDURA) 4 MG tablet TAKE 1 TABLET BY MOUTH DAILY. 90 tablet 3  . fluocinonide cream (LIDEX) 0.05 % APPLY TO AFFECTED AREA TWICE A DAY AS NEEDED. 60 g 0  . FREESTYLE LITE test strip USE AS INSTRUCTED ONCE DAILY 90 each 3  . furosemide (LASIX) 20  MG tablet Take 1 tablet (20 mg total) by mouth daily. 90 tablet 2  . Lancets (FREESTYLE) lancets USE AS DIRECTED DAILY. 100 each PRN  . losartan (COZAAR) 50 MG tablet Take 1 tablet (50 mg total) by mouth daily. 90 tablet 3  . Melatonin 10 MG TABS Take 1 tablet by mouth as needed.    . metFORMIN (GLUCOPHAGE-XR) 500 MG 24 hr tablet Take 1 tablet (500 mg total) by mouth daily with breakfast. 90 tablet 4  . Multiple Vitamin (MULTIVITAMIN) tablet Take 1 tablet by mouth daily.      Marland Kitchen omeprazole (PRILOSEC) 20 MG capsule Take 1 capsule (20 mg total) by mouth daily. 90 capsule 3  . promethazine-codeine (PHENERGAN WITH CODEINE) 6.25-10 MG/5ML syrup Take 5 mLs by mouth every 8 (eight) hours as needed for cough. 120 mL 0  . simvastatin (ZOCOR) 40 MG tablet Take 1 tablet (40 mg total) by mouth at bedtime. 90 tablet 3  . TESTIM 50 MG/5GM (1%) GEL APPLY THE CONTENTS OF 2 TUBES ONTO THE SKIN DAILY 300 g 5  . traZODone (DESYREL) 100 MG tablet Take 1 tablet (100 mg total) by mouth at bedtime as  needed for sleep. 30 tablet 3   No current facility-administered medications for this visit.     Allergies:   Propoxyphene n-acetaminophen    ROS:  Please see the history of present illness.   Otherwise, review of systems are positive for none.   All other systems are reviewed and negative.    PHYSICAL EXAM: VS:  BP 134/76   Pulse 66   Temp 97.9 F (36.6 C)   Ht 5\' 10"  (1.778 m)   Wt 211 lb 3.2 oz (95.8 kg)   SpO2 98%   BMI 30.30 kg/m  , BMI Body mass index is 30.3 kg/m. GENERAL:  Well appearing HEENT:  Pupils equal round and reactive, fundi not visualized, oral mucosa unremarkable NECK:  No jugular venous distention, waveform within normal limits, carotid upstroke brisk and symmetric, no bruits, no thyromegaly LYMPHATICS:  No cervical, inguinal adenopathy LUNGS:  Clear to auscultation bilaterally BACK:  No CVA tenderness CHEST:  Unremarkable HEART:  PMI not displaced or sustained,S1 and S2 within normal  limits, no S3, no S4, no clicks, no rubs, no murmurs ABD:  Flat, positive bowel sounds normal in frequency in pitch, no bruits, no rebound, no guarding, no midline pulsatile mass, no hepatomegaly, no splenomegaly EXT:  2 plus pulses throughout, no edema, no cyanosis no clubbing SKIN:  No rashes no nodules NEURO:  Cranial nerves II through XII grossly intact, motor grossly intact throughout PSYCH:  Cognitively intact, oriented to person place and time    EKG:  EKG is not ordered today.   Recent Labs: 03/21/2019: ALT 21; BUN 11; Creatinine, Ser 0.89; Hemoglobin 14.0; Platelets 141.0; Potassium 4.6; Sodium 137    Lipid Panel    Component Value Date/Time   CHOL 112 03/21/2019 0937   TRIG 95.0 03/21/2019 0937   HDL 42.50 03/21/2019 0937   CHOLHDL 3 03/21/2019 0937   VLDL 19.0 03/21/2019 0937   LDLCALC 51 03/21/2019 0937   LDLDIRECT 69.0 03/09/2018 1451      Wt Readings from Last 3 Encounters:  04/27/19 211 lb 3.2 oz (95.8 kg)  03/28/19 212 lb (96.2 kg)  03/21/19 212 lb (96.2 kg)      Other studies Reviewed: Additional studies/ records that were reviewed today include: Holter. Review of the above records demonstrates:  Please see elsewhere in the note.     ASSESSMENT AND PLAN:  ATRIAL FLUTTER:   Mr. Gerald Martinez has a CHA2DS2 - VASc score of 5 this is a new rhythm.  He wore a monitor and had atrial flutter with controlled rate.  We talked about this quite a while.  At this point because of the pandemic we are going to be very conservative and he does not want to consider ablation or cardioversion.  He does not really feel it.  Tolerates anticoagulation.  Check a CBC today.  CORONARY ATHEROSCLEROSIS NATIVE CORONARY ARTERY -    The patient has no new sypmtoms.  No further cardiovascular testing is indicated.  We will continue with aggressive risk reduction and meds as listed.  HYPERTENSION -   The blood pressure is at target. No change in medications is indicated. We will  continue with therapeutic lifestyle changes (TLC).  AAA -he had stable repair about 4 years ago.  No change in therapy.      Current medicines are reviewed at length with the patient today.  The patient does not have concerns regarding medicines.  The following changes have been made:  no change  Labs/ tests  ordered today include: None  Orders Placed This Encounter  Procedures  . CBC     Disposition:   FU with me in six months.     Signed, Minus Breeding, MD  04/27/2019 3:43 PM    [place]

## 2019-04-27 ENCOUNTER — Ambulatory Visit (INDEPENDENT_AMBULATORY_CARE_PROVIDER_SITE_OTHER): Payer: Medicare HMO | Admitting: Cardiology

## 2019-04-27 ENCOUNTER — Other Ambulatory Visit: Payer: Self-pay

## 2019-04-27 ENCOUNTER — Encounter: Payer: Self-pay | Admitting: Cardiology

## 2019-04-27 VITALS — BP 134/76 | HR 66 | Temp 97.9°F | Ht 70.0 in | Wt 211.2 lb

## 2019-04-27 DIAGNOSIS — E118 Type 2 diabetes mellitus with unspecified complications: Secondary | ICD-10-CM

## 2019-04-27 DIAGNOSIS — I2510 Atherosclerotic heart disease of native coronary artery without angina pectoris: Secondary | ICD-10-CM

## 2019-04-27 DIAGNOSIS — E785 Hyperlipidemia, unspecified: Secondary | ICD-10-CM

## 2019-04-27 DIAGNOSIS — I483 Typical atrial flutter: Secondary | ICD-10-CM | POA: Diagnosis not present

## 2019-04-27 NOTE — Patient Instructions (Signed)
Medication Instructions:  Your physician recommends that you continue on your current medications as directed. Please refer to the Current Medication list given to you today.  *If you need a refill on your cardiac medications before your next appointment, please call your pharmacy*  Lab Work: Your physician recommends that you return for lab work TODAY: [place]  If you have labs (blood work) drawn today and your tests are completely normal, you will receive your results only by: Marland Kitchen MyChart Message (if you have MyChart) OR . A paper copy in the mail If you have any lab test that is abnormal or we need to change your treatment, we will call you to review the results.  Testing/Procedures: none  Follow-Up: At [facility], you and your health needs are our priority.  As part of our continuing mission to provide you with exceptional heart care, we have created designated Provider Care Teams.  These Care Teams include your primary Cardiologist (physician) and Advanced Practice Providers (APPs -  Physician Assistants and Nurse Practitioners) who all work together to provide you with the care you need, when you need it.  Your next appointment:   6 month(s)  The format for your next appointment:   Either In Person or Virtual  Provider:   You may see DR. HOCHREIN or one of the following Advanced Practice Providers on your designated Care Team:    Rosaria Ferries, PA-C  Jory Sims, DNP, ANP  Cadence Kathlen Mody, NP

## 2019-04-28 LAB — CBC
Hematocrit: 43.2 % (ref 37.5–51.0)
Hemoglobin: 13.6 g/dL (ref 13.0–17.7)
MCH: 24.8 pg — ABNORMAL LOW (ref 26.6–33.0)
MCHC: 31.5 g/dL (ref 31.5–35.7)
MCV: 79 fL (ref 79–97)
Platelets: 164 10*3/uL (ref 150–450)
RBC: 5.48 x10E6/uL (ref 4.14–5.80)
RDW: 17.5 % — ABNORMAL HIGH (ref 11.6–15.4)
WBC: 5.8 10*3/uL (ref 3.4–10.8)

## 2019-05-05 ENCOUNTER — Other Ambulatory Visit: Payer: Self-pay | Admitting: Family Medicine

## 2019-05-05 NOTE — Telephone Encounter (Signed)
Last filled on 04/11/2019 60 g with 0 refill. LOV 03/21/2019 AWE Next appointment on 09/19/2019 follow up

## 2019-05-07 ENCOUNTER — Encounter: Payer: Self-pay | Admitting: Family Medicine

## 2019-05-17 ENCOUNTER — Other Ambulatory Visit: Payer: Self-pay | Admitting: Family Medicine

## 2019-06-07 ENCOUNTER — Ambulatory Visit: Payer: Medicare Other | Attending: Internal Medicine

## 2019-06-07 DIAGNOSIS — Z23 Encounter for immunization: Secondary | ICD-10-CM | POA: Insufficient documentation

## 2019-06-07 NOTE — Progress Notes (Signed)
   Covid-19 Vaccination Clinic  Name:  Gerald Martinez    MRN: [id number] DOB: 06-01-1933  06/07/2019  Mr. Gerald Martinez was observed post Covid-19 immunization for 30 minutes based on pre-vaccination screening without incidence. He was provided with Vaccine Information Sheet and instruction to access the V-Safe system.   Mr. Gerald Martinez was instructed to call 911 with any severe reactions post vaccine: Marland Kitchen Difficulty breathing  . Swelling of your face and throat  . A fast heartbeat  . A bad rash all over your body  . Dizziness and weakness    Immunizations Administered    Name Date Dose VIS Date Route   Pfizer COVID-19 Vaccine 06/07/2019 11:32 AM 0.3 mL 05/05/2019 Intramuscular   Manufacturer: [place]   Lot: [id number]   [place]: [id number]    =

## 2019-06-20 ENCOUNTER — Ambulatory Visit: Payer: Medicare HMO | Admitting: Internal Medicine

## 2019-06-20 ENCOUNTER — Other Ambulatory Visit: Payer: Self-pay

## 2019-06-20 ENCOUNTER — Encounter: Payer: Self-pay | Admitting: Internal Medicine

## 2019-06-20 VITALS — BP 112/58 | HR 68 | Temp 97.9°F | Ht 69.0 in | Wt 208.0 lb

## 2019-06-20 DIAGNOSIS — K219 Gastro-esophageal reflux disease without esophagitis: Secondary | ICD-10-CM | POA: Diagnosis not present

## 2019-06-20 DIAGNOSIS — Z8501 Personal history of malignant neoplasm of esophagus: Secondary | ICD-10-CM

## 2019-06-20 NOTE — Progress Notes (Signed)
HISTORY OF PRESENT ILLNESS:  Gerald Martinez is a 86 y.o. male with multiple significant medical problems including hypertension, hyperlipidemia, diabetes mellitus, coronary artery disease status post CABG, chronic anticoagulation therapy, remote AAA repair, ventral hernia repair, and remote short segment Barrett's esophagus with focal carcinoma for which she is status post subtotal esophagectomy in 2000.  Last upper endoscopy 2009 with 5 cm of residual esophagus.  Inflamed.  No Barrett's.  Dysphagia at the time.  Treated with PPI.  Symptoms resolved.  Has not had upper endoscopy since.  Last seen in 2014 for routine surveillance colonoscopy.  No polyps.  Severe diverticulosis.  Follow-up as needed.  Patient tells me he is sent today by his primary care provider wondering about the need for surveillance endoscopy.  Patient has been compliant with omeprazole therapy daily.  He denies dysphagia.  No heartburn.  No regurgitation.  He has become more frail with time.  Difficulty walking.  Has had several falls.  Review of outside blood work from October 2020 in December 2020 reveals normal CBC with hemoglobin 13.6.  Unremarkable comprehensive metabolic panel.  Normal liver test.  Hemoglobin A1c 7.2.  Last CT of the abdomen performed 2012.  Reviewed.  REVIEW OF SYSTEMS:  All non-GI ROS negative unless otherwise stated in the HPI except for arthritis, weakness, skin rash  Past Medical History:  Diagnosis Date  . AAA (abdominal aortic aneurysm) ([place])   . Barrett's esophagus   . BPH (benign prostatic hypertrophy)   . CAD (coronary artery disease)    Post CABG in 2000.  LIMA to the LAD, SVG to OM, SVG to PDA, last perfusion study in 2012 with no high risk findings  . Colon polyps   . Diabetes mellitus    Type II  . Dyslipidemia   . Esophageal cancer ([place])   . Hearing loss   . Hypertension     Past Surgical History:  Procedure Laterality Date  . CORONARY ARTERY BYPASS GRAFT  2000  . ESOPHAGECTOMY   2000  . [place] ARTHROPLASTY  1980  . VENTRAL HERNIA REPAIR  2001    Social History Gerald Martinez  reports that he quit smoking about 22 years ago. He has a 52.50 pack-year smoking history. He has never used smokeless tobacco. He reports current alcohol use of about 6.0 standard drinks of alcohol per week. He reports that he does not use drugs.  family history includes Cancer in his brother and sister; Cervical cancer in his sister; Dementia in his brother; Frontotemporal dementia in his brother; Heart disease in his mother; Lymphoma in his father and sister; Memory loss in his sister; Prostate cancer in his brother; Skin cancer in his sister; Stroke (age of onset: 28) in his mother.  Allergies  Allergen Reactions  . Propoxyphene N-Acetaminophen     Not sure if this is accurate per patient. Not sure       PHYSICAL EXAMINATION: Vital signs: BP (!) 112/58 (BP Location: Left Arm, Patient Position: Sitting, Cuff Size: Normal)   Pulse 68   Temp 97.9 F (36.6 C)   Ht 5\' 9"  (1.753 m) Comment: height measured without shoes  Wt 208 lb (94.3 kg)   BMI 30.72 kg/m   Constitutional: Elderly, chronically ill-appearing, no acute distress Psychiatric: alert and oriented x3, cooperative.  Pleasant Eyes: extraocular movements intact, anicteric, conjunctiva pink Mouth: Mask Neck: supple no lymphadenopathy Cardiovascular: heart regular rate and rhythm, no murmur.  Surgical incisions well-healed Lungs: clear to auscultation bilaterally Abdomen: soft,  nontender, nondistended, no obvious ascites, no peritoneal signs, normal bowel sounds, no organomegaly.  Surgical incisions well-healed Rectal: Omitted Extremities: no clubbing or cyanosis.  1+ lower extremity edema bilaterally Skin: no lesions on visible extremities Neuro: No focal deficits.  Cranial nerves intact  ASSESSMENT:  1.  History of short segment Barrett's esophagus with focal adenocarcinoma status post subtotal esophagectomy 20 years  ago.  Last endoscopy 11 years ago with small residual esophagus without Barrett's and esophagitis.  Doing well on PPI.  No role for surveillance at this point given his age, comorbidities, etc. 2.  History of adenomatous colon polyps.  Aged out of surveillance 3.  Multiple significant medical problems   PLAN:  1.  Continue reflux precautions 2.  Continue PPI indefinitely 3.  Return to the care of your PCP.  GI follow-up as needed Total time of 30 minutes spent preparing to see the patient, reviewing tests, obtaining and reviewing history, performing comprehensive physical exam, counseling the patient regarding his GI conditions, reviewing therapeutic recommendations, and documenting clinical information in the health record

## 2019-06-20 NOTE — Patient Instructions (Signed)
Please follow up as needed 

## 2019-06-27 ENCOUNTER — Ambulatory Visit: Payer: Medicare HMO | Attending: Internal Medicine

## 2019-06-27 DIAGNOSIS — Z23 Encounter for immunization: Secondary | ICD-10-CM | POA: Insufficient documentation

## 2019-06-27 NOTE — Progress Notes (Signed)
   Covid-19 Vaccination Clinic  Name:  Gerald Martinez    MRN: [id number] DOB: [birth year]  06/27/2019  [name] was observed post Covid-19 immunization for 15 minutes without incidence. He was provided with Vaccine Information Sheet and instruction to access the V-Safe system.   [name] was instructed to call 911 with any severe reactions post vaccine: Marland Kitchen Difficulty breathing  . Swelling of your face and throat  . A fast heartbeat  . A bad rash all over your body  . Dizziness and weakness    Immunizations Administered    Name Date Dose VIS Date Route   Pfizer COVID-19 Vaccine 06/27/2019 10:02 AM 0.3 mL 05/05/2019 Intramuscular   Manufacturer: [place]   Lot: [id number]   [place]: [id number]

## 2019-07-04 ENCOUNTER — Other Ambulatory Visit: Payer: Self-pay | Admitting: Family Medicine

## 2019-07-04 DIAGNOSIS — R69 Illness, unspecified: Secondary | ICD-10-CM | POA: Diagnosis not present

## 2019-07-12 ENCOUNTER — Other Ambulatory Visit: Payer: Self-pay | Admitting: Family Medicine

## 2019-07-12 ENCOUNTER — Other Ambulatory Visit: Payer: Self-pay | Admitting: Endocrinology

## 2019-07-12 DIAGNOSIS — R69 Illness, unspecified: Secondary | ICD-10-CM | POA: Diagnosis not present

## 2019-07-12 MED ORDER — BLOOD GLUCOSE METER KIT: PACK | 12 refills | Status: DC

## 2019-07-12 MED ORDER — ONETOUCH DELICA LANCETS 33G MISC: 2 refills | Status: AC

## 2019-07-12 NOTE — Addendum Note (Signed)
Addended by: Kris Mouton on: 07/12/2019 11:46 AM   Modules accepted: Orders

## 2019-07-18 ENCOUNTER — Ambulatory Visit: Payer: Medicare HMO | Admitting: Podiatry

## 2019-07-18 DIAGNOSIS — R69 Illness, unspecified: Secondary | ICD-10-CM | POA: Diagnosis not present

## 2019-07-28 ENCOUNTER — Other Ambulatory Visit: Payer: Self-pay | Admitting: Endocrinology

## 2019-07-28 ENCOUNTER — Other Ambulatory Visit: Payer: Self-pay | Admitting: Family Medicine

## 2019-07-28 NOTE — Telephone Encounter (Signed)
Please forward refill request to pt's primary care provider.   

## 2019-07-28 NOTE — Telephone Encounter (Signed)
Per Dr. Ellison's request, I am forwarding this refill request. Please review and refill if appropriate.  

## 2019-07-28 NOTE — Telephone Encounter (Signed)
Last filled on 04/28/18 #90 with 2 refills.  LOV 03/21/2019 for follow up Next appointment on 09/19/19

## 2019-07-28 NOTE — Telephone Encounter (Signed)
Please advise 

## 2019-07-28 NOTE — Telephone Encounter (Signed)
Noted, refill provided 

## 2019-08-02 DIAGNOSIS — D485 Neoplasm of uncertain behavior of skin: Secondary | ICD-10-CM | POA: Diagnosis not present

## 2019-08-02 DIAGNOSIS — N401 Enlarged prostate with lower urinary tract symptoms: Secondary | ICD-10-CM | POA: Diagnosis not present

## 2019-08-02 DIAGNOSIS — L570 Actinic keratosis: Secondary | ICD-10-CM | POA: Diagnosis not present

## 2019-08-02 DIAGNOSIS — R972 Elevated prostate specific antigen [PSA]: Secondary | ICD-10-CM | POA: Diagnosis not present

## 2019-08-02 DIAGNOSIS — L578 Other skin changes due to chronic exposure to nonionizing radiation: Secondary | ICD-10-CM | POA: Diagnosis not present

## 2019-08-02 DIAGNOSIS — L821 Other seborrheic keratosis: Secondary | ICD-10-CM | POA: Diagnosis not present

## 2019-08-02 DIAGNOSIS — Z23 Encounter for immunization: Secondary | ICD-10-CM | POA: Diagnosis not present

## 2019-08-02 DIAGNOSIS — R350 Frequency of micturition: Secondary | ICD-10-CM | POA: Diagnosis not present

## 2019-08-02 DIAGNOSIS — B354 Tinea corporis: Secondary | ICD-10-CM | POA: Diagnosis not present

## 2019-08-10 DIAGNOSIS — R69 Illness, unspecified: Secondary | ICD-10-CM | POA: Diagnosis not present

## 2019-08-24 DIAGNOSIS — H6122 Impacted cerumen, left ear: Secondary | ICD-10-CM | POA: Diagnosis not present

## 2019-09-06 ENCOUNTER — Other Ambulatory Visit: Payer: Self-pay

## 2019-09-06 ENCOUNTER — Encounter: Payer: Self-pay | Admitting: Podiatry

## 2019-09-06 ENCOUNTER — Ambulatory Visit (INDEPENDENT_AMBULATORY_CARE_PROVIDER_SITE_OTHER): Payer: Medicare HMO | Admitting: Podiatry

## 2019-09-06 DIAGNOSIS — B351 Tinea unguium: Secondary | ICD-10-CM | POA: Diagnosis not present

## 2019-09-06 DIAGNOSIS — M79676 Pain in unspecified toe(s): Secondary | ICD-10-CM

## 2019-09-06 DIAGNOSIS — E1140 Type 2 diabetes mellitus with diabetic neuropathy, unspecified: Secondary | ICD-10-CM | POA: Diagnosis not present

## 2019-09-06 NOTE — Progress Notes (Signed)
This patient returns to my office for at risk foot care.  This patient requires this care by a professional since this patient will be at risk due to having diabetes and coagulation defect..    This patient is unable to cut nails himself since the patient cannot reach his nails.These nails are painful walking and wearing shoes.  This patient presents for at risk foot care today.  General Appearance  Alert, conversant and in no acute stress.  Vascular  Dorsalis pedis  are palpable  bilaterally.  Posterior tibial pulses are absent  B/L. Capillary return is within normal limits  bilaterally. Temperature is within normal limits  Bilaterally. Venous stasis feet/legs  B/L.  Neurologic  Senn-Weinstein monofilament wire test within normal limits  bilaterally. Muscle power within normal limits bilaterally.  Nails Thick disfigured discolored nails with subungual debris  from hallux to fifth toes bilaterally. No evidence of bacterial infection or drainage bilaterally.  Orthopedic  No limitations of motion  feet .  No crepitus or effusions noted.  Hammer toes  B/l.  Skin  normotropic skin with no porokeratosis noted bilaterally.  No signs of infections or ulcers noted.     Onychomycosis  Pain in right toes  Pain in left toes  Consent was obtained for treatment procedures.   Mechanical debridement of nails 1-5  bilaterally performed with a nail nipper.  Filed with dremel without incident.    Return office visit    3 months                 Told patient to return for periodic foot care and evaluation due to potential at risk complications.   Tawnia Schirm DPM  

## 2019-09-07 ENCOUNTER — Ambulatory Visit: Payer: Medicare HMO | Admitting: Orthotics

## 2019-09-19 ENCOUNTER — Encounter: Payer: Self-pay | Admitting: Family Medicine

## 2019-09-19 ENCOUNTER — Telehealth: Payer: Self-pay

## 2019-09-19 ENCOUNTER — Other Ambulatory Visit: Payer: Self-pay

## 2019-09-19 ENCOUNTER — Ambulatory Visit (INDEPENDENT_AMBULATORY_CARE_PROVIDER_SITE_OTHER): Payer: Medicare HMO | Admitting: Family Medicine

## 2019-09-19 VITALS — BP 118/56 | HR 61 | Temp 97.7°F | Ht 70.75 in | Wt 204.2 lb

## 2019-09-19 DIAGNOSIS — I483 Typical atrial flutter: Secondary | ICD-10-CM

## 2019-09-19 DIAGNOSIS — E299 Testicular dysfunction, unspecified: Secondary | ICD-10-CM

## 2019-09-19 DIAGNOSIS — E1159 Type 2 diabetes mellitus with other circulatory complications: Secondary | ICD-10-CM | POA: Diagnosis not present

## 2019-09-19 DIAGNOSIS — I10 Essential (primary) hypertension: Secondary | ICD-10-CM

## 2019-09-19 DIAGNOSIS — K219 Gastro-esophageal reflux disease without esophagitis: Secondary | ICD-10-CM

## 2019-09-19 DIAGNOSIS — Z8501 Personal history of malignant neoplasm of esophagus: Secondary | ICD-10-CM | POA: Diagnosis not present

## 2019-09-19 MED ORDER — TESTOSTERONE 50 MG/5GM (1%) TD GEL: 5.0000 g | Freq: Every day | TRANSDERMAL | 1 refills | Status: DC

## 2019-09-19 NOTE — Patient Instructions (Signed)
Continue current medications  Return for labs between 8-10 am to check testosterone  If generic is too expensive call me   Call the cardiologist to set up follow-up appointment

## 2019-09-19 NOTE — Telephone Encounter (Signed)
Submitted PA for Testosterone (androgel) gel through covermymeds. Awaiting reply

## 2019-09-19 NOTE — Assessment & Plan Note (Addendum)
BP at goal. Cont losartan.  

## 2019-09-19 NOTE — Assessment & Plan Note (Signed)
Testosterone was too expensive. Will try sending generic to see if that will be approved. Notes worsening fatigue now that he is not taking. He follows with urology so discussed if generic not covered and too expensive, that it may be helpful to reach out to urology to see if they have some options that may be covered. Labs between 8-10 am.

## 2019-09-19 NOTE — Assessment & Plan Note (Addendum)
Reviewed cardiology note - cont eliquis and plan to not have ablation, though no patient seems interested in ablation. Does not have visit scheduled, advised calling cardiology for follow-up appointment. He will do so. Appreciate card support. [facility] eliquis

## 2019-09-19 NOTE — Assessment & Plan Note (Signed)
Last Hgb A1c was 7.2. If still elevated may consider increasing metformin - currently on 500 mg. Repeat labs today

## 2019-09-19 NOTE — Progress Notes (Signed)
Subjective:     Gerald Martinez is a 86 y.o. male presenting for Follow-up (6 months)     HPI   #Atrial flutter - is interested in procedure at this time - does not have follow-up with cardiology  #Testicular dysfunction - testim no longer covered - low energy - taking a lot to go to the gym - was on the testim for 8 years  Using the walker when he is new places and has long walks [place] around the house to move about  #diabetes - has been eating more sweets at home - tolerating metformin  Review of Systems  03/21/2019: Clinic - annual - DM/HLD/HTN - stable and taking medications.  06/20/2019: GI - hx of Barretts, no need for repeat EGD given age and health. Continue PPI indefinitely 04/27/2019: Cards - A Flutter - rate control - anticoagulation. Not interested in ablation at this time. CAD - medication management.   Social History   Tobacco Use  Smoking Status Former Smoker  . Packs/day: 1.50  . Years: 35.00  . Pack years: 52.50  . Quit date: 05/25/1997  . Years since quitting: 22.3  Smokeless Tobacco Never Used        Objective:    BP Readings from Last 3 Encounters:  09/19/19 (!) 118/56  06/20/19 (!) 112/58  04/27/19 134/76   Wt Readings from Last 3 Encounters:  09/19/19 204 lb 4 oz (92.6 kg)  06/20/19 208 lb (94.3 kg)  04/27/19 211 lb 3.2 oz (95.8 kg)    BP (!) 118/56   Pulse 61   Temp 97.7 F (36.5 C)   Ht 5' 10.75" (1.797 m)   Wt 204 lb 4 oz (92.6 kg)   SpO2 97%   BMI 28.69 kg/m    Physical Exam Constitutional:      Appearance: Normal appearance. He is not ill-appearing or diaphoretic.     Comments: Ambulates with walker  HENT:     Right Ear: External ear normal.     Left Ear: External ear normal.  Eyes:     General: No scleral icterus.    Extraocular Movements: Extraocular movements intact.     Conjunctiva/sclera: Conjunctivae normal.  Cardiovascular:     Rate and Rhythm: Normal rate and regular rhythm.     Heart sounds: No  murmur.  Pulmonary:     Effort: Pulmonary effort is normal. No respiratory distress.     Breath sounds: Normal breath sounds. No wheezing.  Musculoskeletal:     Cervical back: Neck supple.  Skin:    General: Skin is warm and dry.  Neurological:     Mental Status: He is alert. Mental status is at baseline.  Psychiatric:        Mood and Affect: Mood normal.           Assessment & Plan:   Problem List Items Addressed This Visit      Cardiovascular and Mediastinum   Essential hypertension - Primary    BP at goal. Cont losartan       Relevant Orders   Comprehensive metabolic panel   CBC   Typical atrial flutter [facility])    Reviewed cardiology note - cont eliquis and plan to not have ablation, though no patient seems interested in ablation. Does not have visit scheduled, advised calling cardiology for follow-up appointment. He will do so. Appreciate card support. [facility] eliquis         Digestive   GERD (gastroesophageal reflux disease)  Endocrine   Diabetes ([place])    Last Hgb A1c was 7.2. If still elevated may consider increasing metformin - currently on 500 mg. Repeat labs today      Relevant Orders   Hemoglobin A1c   Testicular dysfunction    Testosterone was too expensive. Will try sending generic to see if that will be approved. Notes worsening fatigue now that he is not taking. He follows with urology so discussed if generic not covered and too expensive, that it may be helpful to reach out to urology to see if they have some options that may be covered. Labs between 8-10 am.       Relevant Medications   testosterone (ANDROGEL) 50 MG/5GM (1%) GEL   Other Relevant Orders   Testosterone     Other   History of esophageal cancer    Reviewed GI note - given age and risk factors would not recommend EGD. Continue PPI indefinitely which seems to be controlling his symptoms well.           Return in about 6 months (around 03/20/2020).  Lesleigh Noe, MD

## 2019-09-19 NOTE — Assessment & Plan Note (Signed)
Reviewed GI note - given age and risk factors would not recommend EGD. Continue PPI indefinitely which seems to be controlling his symptoms well.

## 2019-09-20 NOTE — Telephone Encounter (Signed)
PA approved for dates 05/26/19 through 05/24/20. Pharmacy advised through fax

## 2019-09-22 ENCOUNTER — Other Ambulatory Visit (INDEPENDENT_AMBULATORY_CARE_PROVIDER_SITE_OTHER): Payer: Medicare HMO

## 2019-09-22 ENCOUNTER — Other Ambulatory Visit: Payer: Self-pay | Admitting: Cardiology

## 2019-09-22 DIAGNOSIS — E299 Testicular dysfunction, unspecified: Secondary | ICD-10-CM | POA: Diagnosis not present

## 2019-09-22 DIAGNOSIS — I10 Essential (primary) hypertension: Secondary | ICD-10-CM | POA: Diagnosis not present

## 2019-09-22 DIAGNOSIS — E1159 Type 2 diabetes mellitus with other circulatory complications: Secondary | ICD-10-CM

## 2019-09-22 LAB — COMPREHENSIVE METABOLIC PANEL
ALT: 34 U/L (ref 0–53)
AST: 27 U/L (ref 0–37)
Albumin: 4 g/dL (ref 3.5–5.2)
Alkaline Phosphatase: 86 U/L (ref 39–117)
BUN: 18 mg/dL (ref 6–23)
CO2: 30 mEq/L (ref 19–32)
Calcium: 9.2 mg/dL (ref 8.4–10.5)
Chloride: 100 mEq/L (ref 96–112)
Creatinine, Ser: 0.98 mg/dL (ref 0.40–1.50)
GFR: 72.96 mL/min (ref >60.00)
Glucose, Bld: 109 mg/dL — ABNORMAL HIGH (ref 70–99)
Potassium: 4.2 mEq/L (ref 3.5–5.1)
Sodium: 137 mEq/L (ref 135–145)
Total Bilirubin: 0.6 mg/dL (ref 0.2–1.2)
Total Protein: 6.4 g/dL (ref 6.0–8.3)

## 2019-09-22 LAB — CBC
HCT: 38.7 % — ABNORMAL LOW (ref 39.0–52.0)
Hemoglobin: 13 g/dL (ref 13.0–17.0)
MCHC: 33.5 g/dL (ref 30.0–36.0)
MCV: 84.5 fl (ref 78.0–100.0)
Platelets: 113 10*3/uL — ABNORMAL LOW (ref 150.0–400.0)
RBC: 4.57 Mil/uL (ref 4.22–5.81)
RDW: 18.9 % — ABNORMAL HIGH (ref 11.5–15.5)
WBC: 5 10*3/uL (ref 4.0–10.5)

## 2019-09-22 LAB — TESTOSTERONE: Testosterone: 196.51 ng/dL — ABNORMAL LOW (ref 300.00–890.00)

## 2019-09-22 LAB — HEMOGLOBIN A1C: Hgb A1c MFr Bld: 7.4 % — ABNORMAL HIGH (ref 4.6–6.5)

## 2019-09-26 ENCOUNTER — Other Ambulatory Visit: Payer: Self-pay

## 2019-09-26 MED ORDER — METFORMIN HCL ER 500 MG PO TB24: 500.0000 mg | ORAL_TABLET | Freq: Two times a day (BID) | ORAL | 2 refills | Status: DC

## 2019-09-26 NOTE — Telephone Encounter (Signed)
Refill sent in for Metformin with new directions of 500 mg 1 BID. This was changed per recent lab result.

## 2019-10-17 ENCOUNTER — Telehealth: Payer: Self-pay | Admitting: Family Medicine

## 2019-10-17 NOTE — Telephone Encounter (Signed)
Pt returned call and I gave him information to return call.

## 2019-10-17 NOTE — Progress Notes (Signed)
  Chronic Care Management   Outreach Note  10/17/2019 Name: Gerald Martinez MRN: [id number] DOB: [birth year]  Referred by: Lesleigh Noe, MD Reason for referral : No chief complaint on file.   An unsuccessful telephone outreach was attempted today. The patient was referred to the pharmacist for assistance with care management and care coordination.   This note is not being shared with the patient for the following reason: To respect privacy (The patient or proxy has requested that the information not be shared).  Follow Up Plan:   Earney Hamburg Upstream Scheduler

## 2019-10-18 ENCOUNTER — Telehealth: Payer: Self-pay | Admitting: Family Medicine

## 2019-10-18 NOTE — Progress Notes (Signed)
  Chronic Care Management   Outreach Note  10/18/2019 Name: Gerald Martinez MRN: [id number] DOB: 1934-04-26  Referred by: Lesleigh Noe, MD Reason for referral : No chief complaint on file.   A second unsuccessful telephone outreach was attempted today. The patient was referred to pharmacist for assistance with care management and care coordination.  This note is not being shared with the patient for the following reason: To respect privacy (The patient or proxy has requested that the information not be shared).  Follow Up Plan:   Earney Hamburg Upstream Scheduler

## 2019-11-02 ENCOUNTER — Telehealth: Payer: Self-pay | Admitting: Family Medicine

## 2019-11-02 NOTE — Telephone Encounter (Signed)
Patient dropped off [place] [employer] form to be completed. Rx tower

## 2019-11-02 NOTE — Telephone Encounter (Signed)
Form initiated and given to provider to finish.

## 2019-11-03 NOTE — Telephone Encounter (Signed)
Patient notified form is ready for pick up. °

## 2019-11-03 NOTE — Telephone Encounter (Signed)
Patient needs DMV form filled out as soon as possible.  Patient said he's not having any issues. Patient has to get form back to [facility] by 11/12/19. Form is in Dr.Cody's in box.

## 2019-11-03 NOTE — Telephone Encounter (Signed)
Will complete on Monday

## 2019-11-06 ENCOUNTER — Encounter: Payer: Self-pay | Admitting: Family Medicine

## 2019-11-06 DIAGNOSIS — Z0279 Encounter for issue of other medical certificate: Secondary | ICD-10-CM

## 2019-11-06 NOTE — Telephone Encounter (Signed)
Patient called in regards to the form he is needing filled out of the DMV. He wanted to know if this has been completed. Spoke with Larene Beach, CMA helping Dr Einar Pheasant and stated that he has been given to Dr Einar Pheasant to fill out.   Patient advised, he stated he understood but requested a call back from her nurse to speak with

## 2019-11-07 NOTE — Telephone Encounter (Signed)
Form has been completed. Pt notified and paperwork is placed up front for pick up.

## 2019-11-11 ENCOUNTER — Encounter: Payer: Self-pay | Admitting: Family Medicine

## 2019-11-13 DIAGNOSIS — Z7189 Other specified counseling: Secondary | ICD-10-CM | POA: Insufficient documentation

## 2019-11-13 NOTE — Progress Notes (Signed)
Cardiology Office Note   Date:  11/14/2019   ID:  Gerald Martinez, DOB 1933-07-23, MRN [id number]  PCP:  Lesleigh Noe, MD  Cardiologist:   Minus Breeding, MD  Chief Complaint  Patient presents with  . Coronary Artery Disease      History of Present Illness: Gerald Martinez is a 86 y.o. male who presents for follow up of atrial fibrillation and coronary disease.  Since I last saw him he has been taking care of his brother who has had a myriad of medical problems and currently is in the ICU with multiorgan system failure by his report.  There is lots of family stress.  The patient is a healthcare power of attorney.  He walks with a walker and is not describing any new cardiovascular symptoms. The patient denies any new symptoms such as chest discomfort, neck or arm discomfort. There has been no new shortness of breath, PND or orthopnea. There have been no reported palpitations, presyncope or syncope.    Past Medical History:  Diagnosis Date  . AAA (abdominal aortic aneurysm) ([place])   . Barrett's esophagus   . BPH (benign prostatic hypertrophy)   . CAD (coronary artery disease)    Post CABG in 2000.  LIMA to the LAD, SVG to OM, SVG to PDA, last perfusion study in 2012 with no high risk findings  . Colon polyps   . Diabetes mellitus    Type II  . Dyslipidemia   . Esophageal cancer ([place])   . Hearing loss   . Hypertension     Past Surgical History:  Procedure Laterality Date  . CORONARY ARTERY BYPASS GRAFT  2000  . ESOPHAGECTOMY  2000  . [place] ARTHROPLASTY  1980  . VENTRAL HERNIA REPAIR  2001     Current Outpatient Medications  Medication Sig Dispense Refill  . blood glucose meter kit and supplies One touch ultra 2-Check sugar once daily DX E11.59 1 each 12  . BOOSTRIX 5-2.5-18.5 LF-MCG/0.5 injection     . doxazosin (CARDURA) 4 MG tablet TAKE 1 TABLET BY MOUTH DAILY. 90 tablet 3  . ELIQUIS 5 MG TABS tablet TAKE 1 TABLET BY MOUTH 2 TIMES DAILY. 180 tablet 1  .  glucose blood (ONETOUCH ULTRA) test strip Check sugar once daily DX E11.59 100 strip 2  . losartan (COZAAR) 50 MG tablet TAKE 1 TABLET (50 MG TOTAL) BY MOUTH DAILY. 90 tablet 1  . Melatonin 10 MG TABS Take 1 tablet by mouth as needed.    . metFORMIN (GLUCOPHAGE-XR) 500 MG 24 hr tablet Take 1 tablet (500 mg total) by mouth in the morning and at bedtime. 180 tablet 2  . Multiple Vitamin (MULTIVITAMIN) tablet Take 1 tablet by mouth daily.      . Multiple Vitamins-Minerals (CENTRUM ADULTS) TABS Take by mouth.    Marland Kitchen omeprazole (PRILOSEC) 20 MG capsule TAKE 1 CAPSULE (20 MG TOTAL) BY MOUTH DAILY. 90 capsule 3  . OneTouch Delica Lancets 18H MISC Check sugar once daily DX E11.59 100 each 2  . simvastatin (ZOCOR) 40 MG tablet TAKE 1 TABLET (40 MG TOTAL) BY MOUTH AT BEDTIME. 90 tablet 3  . testosterone (ANDROGEL) 50 MG/5GM (1%) GEL Place 5 g onto the skin daily. 300 g 1   No current facility-administered medications for this visit.    Allergies:   Propoxyphene n-acetaminophen    ROS:  Please see the history of present illness.   Otherwise, review of systems are positive for insomnia.  All other systems are reviewed and negative.    PHYSICAL EXAM: VS:  BP 140/80   Pulse (!) 46   Temp (!) 97.3 F (36.3 C)   Ht 5' 10.5" (1.791 m)   Wt 203 lb 12.8 oz (92.4 kg)   SpO2 97%   BMI 28.83 kg/m  , BMI Body mass index is 28.83 kg/m. GENERAL: Slightly frail appearing appearing NECK:  No jugular venous distention, waveform within normal limits, carotid upstroke brisk and symmetric, no bruits, no thyromegaly LUNGS:  Clear to auscultation bilaterally CHEST:  Well healed sternotomy scar.  HEART:  PMI not displaced or sustained,S1 and S2 within normal limits, no S3, no clicks, no rubs, no murmurs ABD:  Flat, positive bowel sounds normal in frequency in pitch, no bruits, no rebound, no guarding, no midline pulsatile mass, no hepatomegaly, no splenomegaly EXT:  2 plus pulses throughout, bilateral ankle edema,  no cyanosis no clubbing   EKG:  EKG is  ordered today. Atrial fibrillation, rate 46, axis within normal limits, intervals within normal limits, no acute ST-T wave changes.  Recent Labs: 09/22/2019: ALT 34; BUN 18; Creatinine, Ser 0.98; Hemoglobin 13.0; Platelets 113.0; Potassium 4.2; Sodium 137    Lipid Panel    Component Value Date/Time   CHOL 112 03/21/2019 0937   TRIG 95.0 03/21/2019 0937   HDL 42.50 03/21/2019 0937   CHOLHDL 3 03/21/2019 0937   VLDL 19.0 03/21/2019 0937   LDLCALC 51 03/21/2019 0937   LDLDIRECT 69.0 03/09/2018 1451      Wt Readings from Last 3 Encounters:  11/14/19 203 lb 12.8 oz (92.4 kg)  09/19/19 204 lb 4 oz (92.6 kg)  06/20/19 208 lb (94.3 kg)      Other studies Reviewed: Additional studies/ records that were reviewed today include: None. Review of the above records demonstrates: NA  ASSESSMENT AND PLAN:  ATRIAL FLUTTER:   [name] has a CHA2DS2 - VASc score of 5.   He has a slow rate but he tolerates this.  He is up-to-date with blood work.  He tolerates anticoagulation without any bleeding.  No change in therapy.  CORONARY ATHEROSCLEROSIS NATIVE CORONARY ARTERY -     he has had no new symptoms.  No change in therapy.   HYPERTENSION -   The blood pressure is at target.  No change in therapy.  COVID EDUCATION: He has been vaccinated.   Current medicines are reviewed at length with the patient today.  The patient does not have concerns regarding medicines.  The following changes have been made:  None  Labs/ tests ordered today include: None  No orders of the defined types were placed in this encounter.    Disposition:   FU with me in in six months.     Signed, Minus Breeding, MD  11/14/2019 4:09 PM    [facility]

## 2019-11-14 ENCOUNTER — Encounter: Payer: Self-pay | Admitting: Cardiology

## 2019-11-14 ENCOUNTER — Other Ambulatory Visit: Payer: Self-pay

## 2019-11-14 ENCOUNTER — Ambulatory Visit (INDEPENDENT_AMBULATORY_CARE_PROVIDER_SITE_OTHER): Payer: Medicare HMO | Admitting: Cardiology

## 2019-11-14 VITALS — BP 140/80 | HR 46 | Temp 97.3°F | Ht 70.5 in | Wt 203.8 lb

## 2019-11-14 DIAGNOSIS — I483 Typical atrial flutter: Secondary | ICD-10-CM

## 2019-11-14 DIAGNOSIS — Z7189 Other specified counseling: Secondary | ICD-10-CM

## 2019-11-14 DIAGNOSIS — I2510 Atherosclerotic heart disease of native coronary artery without angina pectoris: Secondary | ICD-10-CM

## 2019-11-14 DIAGNOSIS — I10 Essential (primary) hypertension: Secondary | ICD-10-CM

## 2019-11-14 NOTE — Patient Instructions (Signed)
Medication Instructions:  TAKE BENADRYL 25 MG AT BEDTIME FOR SLEEP   *If you need a refill on your cardiac medications before your next appointment, please call your pharmacy*  Lab Work: NONE If you have labs (blood work) drawn today and your tests are completely normal, you will receive your results only by: Marland Kitchen MyChart Message (if you have MyChart) OR . A paper copy in the mail If you have any lab test that is abnormal or we need to change your treatment, we will call you to review the results.  Testing/Procedures: NONE  Follow-Up: At [facility], you and your health needs are our priority.  As part of our continuing mission to provide you with exceptional heart care, we have created designated Provider Care Teams.  These Care Teams include your primary Cardiologist (physician) and Advanced Practice Providers (APPs -  Physician Assistants and Nurse Practitioners) who all work together to provide you with the care you need, when you need it.  We recommend signing up for the patient portal called "MyChart".  Sign up information is provided on this After Visit Summary.  MyChart is used to connect with patients for Virtual Visits (Telemedicine).  Patients are able to view lab/test results, encounter notes, upcoming appointments, etc.  Non-urgent messages can be sent to your provider as well.   To learn more about what you can do with MyChart, go to [email or web address].    Your next appointment:   6 month(s)   The format for your next appointment:   In Person  Provider:   You may see DR [facility]  or one of the following Advanced Practice Providers on your designated Care Team:    Rosaria Ferries, PA-C  Jory Sims, DNP, ANP  Cadence Kathlen Mody, NP

## 2019-11-15 ENCOUNTER — Telehealth: Payer: Self-pay | Admitting: Cardiology

## 2019-11-15 NOTE — Telephone Encounter (Signed)
° °  Pt c/o medication issue:  1. Name of Medication: benadryl  2. How are you currently taking this medication (dosage and times per day)?   3. Are you having a reaction (difficulty breathing--STAT)?   4. What is your medication issue? Pt said Dr. Percival Spanish recommended benadryl for sleep aid, he said it only worked 3 hours. He wanted to get prescription medication and if Dr. Percival Spanish is worried he said just call in 10 day supply to try.  Please advise

## 2019-11-15 NOTE — Telephone Encounter (Signed)
Returned the call to the patient. Gerald Martinez stated that Gerald Martinez tried the Benadryl for sleep last night as advised at the office visit yesterday. Gerald Martinez was only able to sleep three hours. Gerald Martinez wants to know if Dr. Percival Spanish would prescribe something for sleep even if it is only 10 pills. Gerald Martinez has been advised that Gerald Martinez may need to try the Benadryl more than one night to see if it will help. Gerald Martinez again stated that Gerald Martinez would rather get a prescription  aid for sleep.

## 2019-11-16 NOTE — Telephone Encounter (Signed)
I would like him to talk to his PCP since he has not failed benadryl and melatonin.  I would like them to suggest ambian vs trazadone vs dual orexin receptor antagonist.   Please call him to explain. Dr. Percival Spanish

## 2019-11-16 NOTE — Telephone Encounter (Signed)
Please see the response from Dr. Einar Pheasant.

## 2019-11-16 NOTE — Telephone Encounter (Signed)
He could try Unisom OTC.   Unfortunately with Medicare many of the medications are very expensive. Best for elderly is Doxepin which can be very expensive. He is not currently on an antidepressant so Trazodone may be the least expensive. Could try Orexin receptor antagonist but not sure what the cost would be on that.   I did recommend that he make an appointment with me to discuss risks and benefits of sleep aids.

## 2019-11-24 NOTE — Addendum Note (Signed)
Addended by: Merri Ray A on: 11/24/2019 01:03 PM   Modules accepted: Orders

## 2019-11-30 ENCOUNTER — Telehealth: Payer: Self-pay | Admitting: Family Medicine

## 2019-11-30 NOTE — Telephone Encounter (Signed)
Pt called and said his blood pressure readings have been too high. The highest reading was 176/73 heart rate 86 and the lowest of 14 readings was 140/68 heart rate 83. He thinks he needs a stronger medication and would like a call back. Please call home phone number.

## 2019-11-30 NOTE — Telephone Encounter (Signed)
Spoke to pt and advised him of instructions from Dr. Einar Pheasant and made an appt for next week for a BP check.

## 2019-11-30 NOTE — Telephone Encounter (Signed)
Spoke to pt about his sleeping issue and he says he believes he has fixed the problem and has no further concerns at this time.

## 2019-11-30 NOTE — Telephone Encounter (Signed)
Have patient try taking 2 Losartan pills for total of 100 mg daily.   Please have him schedule appointment next week to discuss blood pressure and check  ER precautions - chest pain, shortness of breath, severe headache, neurological findings

## 2019-12-06 ENCOUNTER — Other Ambulatory Visit: Payer: Self-pay

## 2019-12-06 ENCOUNTER — Ambulatory Visit (INDEPENDENT_AMBULATORY_CARE_PROVIDER_SITE_OTHER): Payer: Medicare HMO | Admitting: Podiatry

## 2019-12-06 ENCOUNTER — Encounter: Payer: Self-pay | Admitting: Podiatry

## 2019-12-06 DIAGNOSIS — M79676 Pain in unspecified toe(s): Secondary | ICD-10-CM | POA: Diagnosis not present

## 2019-12-06 DIAGNOSIS — B351 Tinea unguium: Secondary | ICD-10-CM

## 2019-12-06 DIAGNOSIS — E1140 Type 2 diabetes mellitus with diabetic neuropathy, unspecified: Secondary | ICD-10-CM | POA: Diagnosis not present

## 2019-12-06 NOTE — Progress Notes (Signed)
This patient returns to my office for at risk foot care.  This patient requires this care by a professional since this patient will be at risk due to having diabetes and coagulation defect..    This patient is unable to cut nails himself since the patient cannot reach his nails.These nails are painful walking and wearing shoes.  This patient presents for at risk foot care today.  General Appearance  Alert, conversant and in no acute stress.  Vascular  Dorsalis pedis  are palpable  bilaterally.  Posterior tibial pulses are absent  B/L. Capillary return is within normal limits  bilaterally. Temperature is within normal limits  Bilaterally. Venous stasis feet/legs  B/L.  Neurologic  Senn-Weinstein monofilament wire test within normal limits  bilaterally. Muscle power within normal limits bilaterally.  Nails Thick disfigured discolored nails with subungual debris  from hallux to fifth toes bilaterally. No evidence of bacterial infection or drainage bilaterally.  Orthopedic  No limitations of motion  feet .  No crepitus or effusions noted.  Hammer toes  B/l.  Skin  normotropic skin with no porokeratosis noted bilaterally.  No signs of infections or ulcers noted.     Onychomycosis  Pain in right toes  Pain in left toes  Consent was obtained for treatment procedures.   Mechanical debridement of nails 1-5  bilaterally performed with a nail nipper.  Filed with dremel without incident.    Return office visit    3 months                 Told patient to return for periodic foot care and evaluation due to potential at risk complications.   Jerron Niblack DPM  

## 2019-12-07 ENCOUNTER — Ambulatory Visit (INDEPENDENT_AMBULATORY_CARE_PROVIDER_SITE_OTHER): Payer: Medicare HMO | Admitting: Family Medicine

## 2019-12-07 ENCOUNTER — Encounter: Payer: Self-pay | Admitting: Family Medicine

## 2019-12-07 VITALS — BP 136/70 | HR 60 | Temp 97.7°F | Wt 202.5 lb

## 2019-12-07 DIAGNOSIS — G4709 Other insomnia: Secondary | ICD-10-CM

## 2019-12-07 DIAGNOSIS — I10 Essential (primary) hypertension: Secondary | ICD-10-CM | POA: Diagnosis not present

## 2019-12-07 MED ORDER — TRAZODONE HCL 50 MG PO TABS: 50.0000 mg | ORAL_TABLET | Freq: Every evening | ORAL | 3 refills | Status: AC | PRN

## 2019-12-07 NOTE — Progress Notes (Signed)
Subjective:     Gerald Martinez is a 86 y.o. male presenting for Blood Pressure Check and discuss medication (for sleep aide )     HPI  #HTN - has noticed he bp has been high at home - checks his bp at random times during the day/night - will check if high - will also check before/after exercise - today SBP 176 and then 149 - HR 59-86 - denies dizziness, cp, HA, vision changes - endorses some breathing difficulty with the heat of the day - has been taking Losartan 100 mg day w/ some improvement   #Insomnia - a lot of stress recently - his brother recently passed away - son was recently diagnosed with stage 4 pancreatic cancer - dealing with constant concern - no trouble falling asleep - the issue is staying asleep - Treatment: Melatonin w/ minimal improvement - used trazodone a few years ago - but felt "hung over" the next morning - but did help with sleep, will primarily take for travel - sleeping in a lazy boy - has tried taking melatonin before bed but will wake up - benadryl did not work   Review of Systems  11/30/2019: Phone - BP elevated 140-170/60-70. Increase losartan to 100 mg daily   Social History   Tobacco Use  Smoking Status Former Smoker  . Packs/day: 1.50  . Years: 35.00  . Pack years: 52.50  . Quit date: 05/25/1997  . Years since quitting: 22.5  Smokeless Tobacco Never Used        Objective:    BP Readings from Last 3 Encounters:  12/07/19 136/70  11/14/19 140/80  09/19/19 (!) 118/56   Wt Readings from Last 3 Encounters:  12/07/19 202 lb 8 oz (91.9 kg)  11/14/19 203 lb 12.8 oz (92.4 kg)  09/19/19 204 lb 4 oz (92.6 kg)    BP 136/70   Pulse 60   Temp 97.7 F (36.5 C) (Temporal)   Wt 202 lb 8 oz (91.9 kg)   SpO2 98%   BMI 28.65 kg/m    Physical Exam Constitutional:      Appearance: Normal appearance. He is not ill-appearing or diaphoretic.  HENT:     Right Ear: External ear normal.     Left Ear: External ear normal.  Eyes:       General: No scleral icterus.    Extraocular Movements: Extraocular movements intact.     Conjunctiva/sclera: Conjunctivae normal.  Cardiovascular:     Rate and Rhythm: Normal rate and regular rhythm.     Heart sounds: Murmur heard.   Pulmonary:     Effort: Pulmonary effort is normal. No respiratory distress.     Breath sounds: Normal breath sounds. No wheezing.  Musculoskeletal:     Cervical back: Neck supple.  Skin:    General: Skin is warm and dry.  Neurological:     Mental Status: He is alert. Mental status is at baseline.  Psychiatric:        Mood and Affect: Mood normal.           Assessment & Plan:   Problem List Items Addressed This Visit      Cardiovascular and Mediastinum   Essential hypertension    BP at goal. Continue losartan 100 mg daily. Discussed how to check BP at home and advised appropriate monitoring. If still elevated at home recommend nurse visit and to bring home monitor to compare.         Other   Insomnia -  Primary    Worse. Many recent life stressors. Encouraged therapy - declined. Sleep hygiene handout. Discussed sedation and fall risk with sleep aid. Will do trazodone as this has worked for him and do lowest dose w/o daytime foggy feeling. Return 4 weeks - reach out sooner and could try hydroxyzine though benadryl did not help      Relevant Medications   traZODone (DESYREL) 50 MG tablet       Return in about 4 weeks (around 01/04/2020).  Lesleigh Noe, MD  This visit occurred during the SARS-CoV-2 public health emergency.  Safety protocols were in place, including screening questions prior to the visit, additional usage of staff PPE, and extensive cleaning of exam room while observing appropriate contact time as indicated for disinfecting solutions.

## 2019-12-07 NOTE — Assessment & Plan Note (Signed)
Worse. Many recent life stressors. Encouraged therapy - declined. Sleep hygiene handout. Discussed sedation and fall risk with sleep aid. Will do trazodone as this has worked for him and do lowest dose w/o daytime foggy feeling. Return 4 weeks - reach out sooner and could try hydroxyzine though benadryl did not help

## 2019-12-07 NOTE — Patient Instructions (Addendum)
Continue Losartan 100 mg   Please check your blood pressure 2-4 times a week.   To check your blood pressure 1) Sit in a quiet and relaxed place for 5 minutes 2) Make sure your feet are flat on the ground 3) Consider checking first thing in the morning   Normal blood pressure is less than 140/90 Ideally you blood pressure should be around 120/80  If still getting blood pressure >150/90 at home, make a nurse   #Insomnia - Find the lowest dose (up to 100 mg) that will help with sleep but not leave you groggy  - I would recommend taking 30-60 minutes before bed nightly for now  Sleep hygiene checklist: 1. Avoid naps during the day 2. Avoid stimulants such as caffeine and nicotine. Avoid bedtime alcohol (it can speed onset of sleep but the body's metabolism can cause awakenings). At least 2 hours before bedtime 3. All forms of exercise help ensure sound sleep - limit vigorous exercise to morning or late afternoon 4. Avoid food too close to bedtime including chocolate (which contains caffeine) 5. Soak up natural light 6. Establish regular bedtime routine. 7. Associate bed with sleep - avoid TV, computer or phone, reading while in bed. 8. Ensure pleasant, relaxing sleep environment - quiet, dark, cool room.  Good Sleep Hygiene Habits -- Got to bed and wake up within an hour of the same time every day -- Avoid bright screens (from laptop, phone, TV) within at least 30 minutes before bed. The "blue light" supresses the sleep hormone melatonin and the content may stimulate as well -- Maintain a quiet and dark sleep environment (blackout curtains, turn on a fan or white noise to block out disruptive sounds) -- Practicing relaxing activites before bed (taking a shower, reading a book, journaling, meditation app) -- To quiet a busy mind -- consider journaling before bed (jotting down reminders, worry thoughts, as well as positive things like a gratitude list)   Begin a Mindfulness/Meditation  practice -- this can take a little as 3 minutes -- You can find resources in books -- Or you can download apps like  ---- Headspace App (which currently has free content called "Weathering the Storm") ---- Calm (which has a few free options)  ---- Insignt Timer ---- Stop, Breathe & Think  # With each of these Apps - you should decline the "start free trial" offer and as you search through the App should be able to access some of their free content. You can also chose to pay for the content if you find one that works well for you.   # Many of them also offer sleep specific content which may help with insomnia

## 2019-12-07 NOTE — Assessment & Plan Note (Signed)
BP at goal. Continue losartan 100 mg daily. Discussed how to check BP at home and advised appropriate monitoring. If still elevated at home recommend nurse visit and to bring home monitor to compare.

## 2019-12-13 DIAGNOSIS — H4720 Unspecified optic atrophy: Secondary | ICD-10-CM | POA: Diagnosis not present

## 2019-12-13 DIAGNOSIS — H52203 Unspecified astigmatism, bilateral: Secondary | ICD-10-CM | POA: Diagnosis not present

## 2019-12-13 DIAGNOSIS — E119 Type 2 diabetes mellitus without complications: Secondary | ICD-10-CM | POA: Diagnosis not present

## 2019-12-25 ENCOUNTER — Telehealth: Payer: Self-pay

## 2019-12-26 NOTE — Telephone Encounter (Signed)
Ex 

## 2019-12-27 ENCOUNTER — Ambulatory Visit (INDEPENDENT_AMBULATORY_CARE_PROVIDER_SITE_OTHER): Payer: Medicare HMO | Admitting: Primary Care

## 2019-12-27 ENCOUNTER — Encounter: Payer: Self-pay | Admitting: Primary Care

## 2019-12-27 ENCOUNTER — Other Ambulatory Visit: Payer: Self-pay

## 2019-12-27 DIAGNOSIS — I10 Essential (primary) hypertension: Secondary | ICD-10-CM

## 2019-12-27 NOTE — Patient Instructions (Signed)
Avoid checking your blood pressure too frequently.   Continue taking losartan 100 mg daily for blood pressure.  Please update [place] if you notice dizziness, changes in vision, chest pain.  It was a pleasure meeting you!

## 2019-12-27 NOTE — Assessment & Plan Note (Addendum)
Well controlled, given age, in the office today on losartan 100 mg. We compared our reading with both of his automatic cuffs which were not comparable.   First automatic machine reading (his) was 153/78, HR 60. I observed his technique and he was applying the cuff over clothing, too far up his arm, with the cuff facing backwards. We discussed proper placement of the cuff, onto bare skin only, facing the correct direction.  Second automatic machine reading with different machine was 156/80. Proper technique.   Third check manually in our office was 130/80.   Reassurance provided today. Continue losartan 100 mg. Discussed to avoid checking home BP's too frequently. Follow up with PCP as needed.

## 2019-12-27 NOTE — Progress Notes (Signed)
Subjective:    Patient ID: Gerald Martinez, male    DOB: 08/15/1933, 86 y.o.   MRN: [id number]  HPI  This visit occurred during the SARS-CoV-2 public health emergency.  Safety protocols were in place, including screening questions prior to the visit, additional usage of staff PPE, and extensive cleaning of exam room while observing appropriate contact time as indicated for disinfecting solutions.   Gerald Martinez is a 86 year old male patient of Dr. Einar Martinez with a history of hypertension, AAA, coronary atherosclerosis, diabetes, BPH, hyperlipidemia who presents today with reports of hypertension.  He is currently managed on losartan 50 mg, but is taking two tablets (100 mg).  Also managed on doxazosin 4 mg for BPH. He was last evaluated a few weeks ago by PCP, patient endorsed systolic elevated readings of 176 and 149. Also with increased stressed due to loss of a family member and other family illness.  Today He is checking his BP at home which range from 110's-170's/80's with most readings in the 140's/70's with HR ranging low 60's. He denies dizziness, chest pain. He brings two blood pressure cuffs with him today to compare against our reading.  BP Readings from Last 3 Encounters:  12/27/19 (!) 144/72  12/07/19 136/70  11/14/19 140/80   BP Readings from Last 3 Encounters:  12/27/19 130/80  12/07/19 136/70  11/14/19 140/80      Review of Systems  Eyes: Negative for visual disturbance.  Respiratory: Negative for shortness of breath.   Cardiovascular: Negative for chest pain.  Neurological: Negative for dizziness and light-headedness.       Past Medical History:  Diagnosis Date  . AAA (abdominal aortic aneurysm) ([place])   . Barrett's esophagus   . BPH (benign prostatic hypertrophy)   . CAD (coronary artery disease)    Post CABG in 2000.  LIMA to the LAD, SVG to OM, SVG to PDA, last perfusion study in 2012 with no high risk findings  . Colon polyps   . Diabetes mellitus    Type II    . Dyslipidemia   . Esophageal cancer ([place])   . Hearing loss   . Hypertension      Social History   Socioeconomic History  . Marital status: Married    Spouse name: [name]  . Number of children: 2  . Years of education: 3 years of college  . Highest education level: Not on file  Occupational History  . Occupation: retired  Tobacco Use  . Smoking status: Former Smoker    Packs/day: 1.50    Years: 35.00    Pack years: 52.50    Quit date: 05/25/1997    Years since quitting: 22.6  . Smokeless tobacco: Never Used  Vaping Use  . Vaping Use: Never used  Substance and Sexual Activity  . Alcohol use: Yes    Alcohol/week: 6.0 standard drinks    Types: 6 Glasses of wine per week    Comment: small glass of wine each night  . Drug use: No  . Sexual activity: Not Currently  Other Topics Concern  . Not on file  Social History Narrative   Lives with wife Tana Conch)   2 children ([name] - [place]) and [place]   Enjoys: genealogy research   Exercise: goes to the [employer] - 5 days a week most weeks   Social Determinants of Radio broadcast assistant Strain:   . Difficulty of Paying Living Expenses:   Food Insecurity: No Landscape architect  .  Worried About Charity fundraiser in the Last Year: Never true  . Ran Out of Food in the Last Year: Never true  Transportation Needs: No Transportation Needs  . Lack of Transportation (Medical): No  . Lack of Transportation (Non-Medical): No  Physical Activity: Sufficiently Active  . Days of Exercise per Week: 5 days  . Minutes of Exercise per Session: 50 min  Stress: No Stress Concern Present  . Feeling of Stress : Not at all  Social Connections:   . Frequency of Communication with Friends and Family:   . Frequency of Social Gatherings with Friends and Family:   . Attends Religious Services:   . Active Member of Clubs or Organizations:   . Attends Archivist Meetings:   Marland Kitchen Marital Status:   Intimate Partner Violence: Not At Risk  .  Fear of Current or Ex-Partner: No  . Emotionally Abused: No  . Physically Abused: No  . Sexually Abused: No    Past Surgical History:  Procedure Laterality Date  . CORONARY ARTERY BYPASS GRAFT  2000  . ESOPHAGECTOMY  2000  . [place] ARTHROPLASTY  1980  . VENTRAL HERNIA REPAIR  2001    Family History  Problem Relation Age of Onset  . Heart disease Mother   . Stroke Mother 78  . Lymphoma Father   . Cancer Sister        Uncertain  . Dementia Brother   . Frontotemporal dementia Brother   . Memory loss Sister   . Cervical cancer Sister   . Lymphoma Sister   . Skin cancer Sister   . Prostate cancer Brother   . Cancer Brother        cancer of the jaw  . Colon cancer Neg Hx     Allergies  Allergen Reactions  . Propoxyphene N-Acetaminophen     Not sure if this is accurate per patient. Not sure    Current Outpatient Medications on File Prior to Visit  Medication Sig Dispense Refill  . blood glucose meter kit and supplies One touch ultra 2-Check sugar once daily DX E11.59 1 each 12  . BOOSTRIX 5-2.5-18.5 LF-MCG/0.5 injection     . doxazosin (CARDURA) 4 MG tablet TAKE 1 TABLET BY MOUTH DAILY. 90 tablet 3  . ELIQUIS 5 MG TABS tablet TAKE 1 TABLET BY MOUTH 2 TIMES DAILY. 180 tablet 1  . glucose blood (ONETOUCH ULTRA) test strip Check sugar once daily DX E11.59 100 strip 2  . losartan (COZAAR) 50 MG tablet TAKE 1 TABLET (50 MG TOTAL) BY MOUTH DAILY. (Patient taking differently: Take 100 mg by mouth daily. ) 90 tablet 1  . Melatonin 10 MG TABS Take 1 tablet by mouth as needed.    . metFORMIN (GLUCOPHAGE-XR) 500 MG 24 hr tablet Take 1 tablet (500 mg total) by mouth in the morning and at bedtime. (Patient taking differently: Take 1,000 mg by mouth in the morning and at bedtime. ) 180 tablet 2  . Multiple Vitamin (MULTIVITAMIN) tablet Take 1 tablet by mouth daily.      . Multiple Vitamins-Minerals (CENTRUM ADULTS) TABS Take by mouth.    Marland Kitchen omeprazole (PRILOSEC) 20 MG capsule TAKE 1  CAPSULE (20 MG TOTAL) BY MOUTH DAILY. 90 capsule 3  . OneTouch Delica Lancets 93X MISC Check sugar once daily DX E11.59 100 each 2  . simvastatin (ZOCOR) 40 MG tablet TAKE 1 TABLET (40 MG TOTAL) BY MOUTH AT BEDTIME. 90 tablet 3  . testosterone (ANDROGEL) 50 MG/5GM (  1%) GEL Place 5 g onto the skin daily. 300 g 1  . traZODone (DESYREL) 50 MG tablet Take 1 tablet (50 mg total) by mouth at bedtime as needed for sleep. 30 tablet 3  . [DISCONTINUED] fluocinonide cream (LIDEX) 0.05 % APPLY TO AFFECTED AREA TWICE A DAY AS NEEDED. 60 g 0   No current facility-administered medications on file prior to visit.    BP (!) 144/72   Pulse 72   Temp (!) 96.2 F (35.7 C) (Temporal)   Ht 5' 10.5" (1.791 m)   Wt 199 lb 8 oz (90.5 kg)   SpO2 98%   BMI 28.22 kg/m    Objective:   Physical Exam Cardiovascular:     Rate and Rhythm: Normal rate and regular rhythm.  Pulmonary:     Effort: Pulmonary effort is normal.     Breath sounds: Normal breath sounds.  Musculoskeletal:     Cervical back: Neck supple.  Skin:    General: Skin is warm and dry.            Assessment & Plan:

## 2019-12-29 DIAGNOSIS — B354 Tinea corporis: Secondary | ICD-10-CM | POA: Diagnosis not present

## 2019-12-29 DIAGNOSIS — L500 Allergic urticaria: Secondary | ICD-10-CM | POA: Diagnosis not present

## 2019-12-29 DIAGNOSIS — D485 Neoplasm of uncertain behavior of skin: Secondary | ICD-10-CM | POA: Diagnosis not present

## 2020-01-02 ENCOUNTER — Encounter: Payer: Self-pay | Admitting: Family Medicine

## 2020-01-02 ENCOUNTER — Ambulatory Visit (INDEPENDENT_AMBULATORY_CARE_PROVIDER_SITE_OTHER): Payer: Medicare HMO | Admitting: Family Medicine

## 2020-01-02 ENCOUNTER — Other Ambulatory Visit: Payer: Self-pay

## 2020-01-02 VITALS — BP 120/62 | HR 83 | Temp 98.3°F | Ht 70.5 in | Wt 198.2 lb

## 2020-01-02 DIAGNOSIS — I10 Essential (primary) hypertension: Secondary | ICD-10-CM | POA: Diagnosis not present

## 2020-01-02 DIAGNOSIS — G4709 Other insomnia: Secondary | ICD-10-CM | POA: Diagnosis not present

## 2020-01-02 DIAGNOSIS — E1159 Type 2 diabetes mellitus with other circulatory complications: Secondary | ICD-10-CM

## 2020-01-02 DIAGNOSIS — R29898 Other symptoms and signs involving the musculoskeletal system: Secondary | ICD-10-CM | POA: Insufficient documentation

## 2020-01-02 LAB — POCT GLYCOSYLATED HEMOGLOBIN (HGB A1C): Hemoglobin A1C: 6.3 % — AB (ref 4.0–5.6)

## 2020-01-02 NOTE — Patient Instructions (Signed)
#  Sleep - continue trazodone and try using just as needed  #Blood pressure - looks good, continue Losartan  #Diabetes - blood work today

## 2020-01-02 NOTE — Assessment & Plan Note (Signed)
Excellent control on 1000 mg metformin BID. Will continue this dose. Foot exam with some neuropathy and pt with left muscle nerve damage as well. Sees podiatry and does exercises and skin checks at home. No sores.

## 2020-01-02 NOTE — Progress Notes (Signed)
Subjective:     Gerald Martinez is a 86 y.o. male presenting for Follow-up (bp)     HPI  #Sleep - taking trazodone 50 mg night - lasting 6-7 hours - taking every night - is going to try taking   #HTN - stopped checking at home - still getting slightly higher numbers at home - working on checking only when relaxed   #DM - going to the foot specialist - has some neuropathy in the left foot - taking metformin 1000 mg BID - doing OK with diet   Review of Systems   Social History   Tobacco Use  Smoking Status Former Smoker  . Packs/day: 1.50  . Years: 35.00  . Pack years: 52.50  . Quit date: 05/25/1997  . Years since quitting: 22.6  Smokeless Tobacco Never Used        Objective:    BP Readings from Last 3 Encounters:  01/02/20 120/62  12/27/19 130/80  12/07/19 136/70   Wt Readings from Last 3 Encounters:  01/02/20 198 lb 4 oz (89.9 kg)  12/27/19 199 lb 8 oz (90.5 kg)  12/07/19 202 lb 8 oz (91.9 kg)    BP 120/62   Pulse 83   Temp 98.3 F (36.8 C) (Temporal)   Ht 5' 10.5" (1.791 m)   Wt 198 lb 4 oz (89.9 kg)   SpO2 96%   BMI 28.04 kg/m    Physical Exam Constitutional:      Appearance: Normal appearance. He is not ill-appearing or diaphoretic.  HENT:     Right Ear: External ear normal.     Left Ear: External ear normal.     Nose: Nose normal.  Eyes:     General: No scleral icterus.    Extraocular Movements: Extraocular movements intact.     Conjunctiva/sclera: Conjunctivae normal.  Cardiovascular:     Rate and Rhythm: Normal rate and regular rhythm.     Heart sounds: No murmur heard.   Pulmonary:     Effort: Pulmonary effort is normal.  Musculoskeletal:     Cervical back: Neck supple.     Right lower leg: Edema present.     Left lower leg: Edema present.     Comments: Unable to flex the toes on his left foot  Skin:    General: Skin is warm and dry.  Neurological:     Mental Status: He is alert. Mental status is at baseline.    Psychiatric:        Mood and Affect: Mood normal.      Diabetic Foot Exam - Simple   Simple Foot Form Diabetic Foot exam was performed with the following findings: Yes 01/02/2020  2:37 PM  Visual Inspection Sensation Testing Pulse Check Comments         Assessment & Plan:   Problem List Items Addressed This Visit      Cardiovascular and Mediastinum   Essential hypertension    Well controlled. Cont losartan        Endocrine   Diabetes ([place]) - Primary    Excellent control on 1000 mg metformin BID. Will continue this dose. Foot exam with some neuropathy and pt with left muscle nerve damage as well. Sees podiatry and does exercises and skin checks at home. No sores.       Relevant Orders   POCT glycosylated hemoglobin (Hb A1C) (Completed)     Other   Insomnia    Significantly improved on trazodone. He will plan to try  and transition to prn use. Cont medication      Weakness of left foot    Ambulates with walker. Chronic issue with work-up done previously. Has home PT exercises that he does. Declined referral for repeat PT at this time. Will continue to monitor for worsening balance issues.          Return in about 6 months (around 07/04/2020).  Lesleigh Noe, MD  This visit occurred during the SARS-CoV-2 public health emergency.  Safety protocols were in place, including screening questions prior to the visit, additional usage of staff PPE, and extensive cleaning of exam room while observing appropriate contact time as indicated for disinfecting solutions.

## 2020-01-02 NOTE — Assessment & Plan Note (Signed)
Ambulates with walker. Chronic issue with work-up done previously. Has home PT exercises that he does. Declined referral for repeat PT at this time. Will continue to monitor for worsening balance issues.

## 2020-01-02 NOTE — Assessment & Plan Note (Signed)
Well controlled. Cont losartan

## 2020-01-02 NOTE — Assessment & Plan Note (Signed)
Significantly improved on trazodone. He will plan to try and transition to prn use. Cont medication

## 2020-01-04 ENCOUNTER — Other Ambulatory Visit: Payer: Self-pay | Admitting: Family Medicine

## 2020-01-04 DIAGNOSIS — E299 Testicular dysfunction, unspecified: Secondary | ICD-10-CM

## 2020-01-04 NOTE — Telephone Encounter (Signed)
Can this patient receive a refill on this medication?   Last office visit- 12-07-2019

## 2020-01-12 ENCOUNTER — Encounter: Payer: Self-pay | Admitting: Physician Assistant

## 2020-01-12 ENCOUNTER — Ambulatory Visit: Payer: Medicare HMO | Admitting: Orthopaedic Surgery

## 2020-01-12 ENCOUNTER — Ambulatory Visit (INDEPENDENT_AMBULATORY_CARE_PROVIDER_SITE_OTHER): Payer: Medicare HMO

## 2020-01-12 VITALS — Ht 69.0 in | Wt 199.0 lb

## 2020-01-12 DIAGNOSIS — M79644 Pain in right finger(s): Secondary | ICD-10-CM | POA: Diagnosis not present

## 2020-01-12 MED ORDER — ACETAMINOPHEN-CODEINE #3 300-30 MG PO TABS
1.0000 | ORAL_TABLET | Freq: Two times a day (BID) | ORAL | 0 refills | Status: DC | PRN
Start: 2020-01-12 — End: 2020-03-19

## 2020-01-12 NOTE — Progress Notes (Signed)
Office Visit Note   Patient: Gerald Martinez           Date of Birth: [birth year]           MRN: [id number] Visit Date: 01/12/2020              Requested by: Lesleigh Noe, MD [place],  [place] [id number] PCP: Lesleigh Noe, MD   Assessment & Plan: Visit Diagnoses:  1. Thumb pain, right     Plan: Impression is right hand first metacarpal base fracture with acceptable alignment.  Due to the patient's age and low demand, we will treat this nonoperatively.  He does ambulate with a walker which will make this difficult to treat in general.  Today, we placed him in a thumb spica splint.  He will elevate as much as possible for pain and swelling.  I have called in Tylenol with codeine to take at night as needed for pain.  He will follow-up with [place] on Tuesday for recheck.  Follow-Up Instructions: Return in about 4 days (around 01/16/2020).   Orders:  Orders Placed This Encounter  Procedures  . XR Finger Thumb Right   Meds ordered this encounter  Medications  . acetaminophen-codeine (TYLENOL #3) 300-30 MG tablet    Sig: Take 1 tablet by mouth 2 (two) times daily as needed for moderate pain.    Dispense:  30 tablet    Refill:  0      Procedures: No procedures performed   Clinical Data: No additional findings.   Subjective: Chief Complaint  Patient presents with  . Right Hand - Injury, Pain    HPI patient is a very pleasant 86 year old gentleman who comes in today with an injury to his right thumb.  He ambulates with a walker and approximately 2 days ago he lost his balance causing him to fall onto his right hand.  He has had pain while trying to use his right thumb.  He has also noticed moderate swelling and bruising.  Of note, he is on Eliquis.  Review of Systems as detailed in HPI.  All others reviewed and are negative.   Objective: Vital Signs: Ht 5\' 9"  (1.753 m)   Wt 199 lb (90.3 kg)   BMI 29.39 kg/m   Physical Exam well-developed well-nourished  gentleman in no acute distress.  Alert and oriented x3.  Ortho Exam examination of the right hand reveals moderate swelling and ecchymosis.  He does have moderate tenderness to the base of the first metacarpal.  Limited range of motion secondary to pain.  No skin tears.  Fingers are warm and well-perfused.  Specialty Comments:  No specialty comments available.  Imaging: XR Finger Thumb Right  Result Date: 01/13/2020 X-rays demonstrate a metaphyseal fracture to the base of the first metacarpal without intraarticular extension    PMFS History: Patient Active Problem List   Diagnosis Date Noted  . Weakness of left foot 01/02/2020  . Educated about COVID-19 virus infection 11/13/2019  . Typical atrial flutter ([place]) 03/28/2019  . Dyslipidemia 03/27/2019  . Insomnia 03/21/2019  . AAA (abdominal aortic aneurysm) ([place]) 10/26/2018  . Fatigue 09/07/2017  . Numbness 09/07/2017  . Bilateral impacted cerumen 06/15/2017  . Mixed conductive and sensorineural hearing loss of both ears 06/15/2017  . Tympanic membrane perforation, right 06/15/2017  . Low back pain 06/01/2017  . Balance problem 03/17/2017  . History of esophageal cancer 08/06/2016  . Pseudophakia of both eyes 01/22/2016  . Vitreomacular  traction syndrome of left eye 01/22/2016  . Allergic rhinitis 01/25/2014  . Hyponatremia 05/05/2013  . GERD (gastroesophageal reflux disease) 09/01/2011  . Encounter for long-term (current) use of other medications 01/19/2011  . Overweight(278.02) 12/16/2010  . BPH (benign prostatic hyperplasia) 09/15/2010  . HYPERCHOLESTEROLEMIA 01/01/2010  . CORONARY ATHEROSCLEROSIS NATIVE CORONARY ARTERY 05/30/2009  . [place] PRESENTING HAZARDS HEALTH 05/30/2009  . RASH-NONVESICULAR 10/17/2008  . Iron deficiency anemia 01/16/2008  . OTHER DYSPHAGIA 08/23/2007  . Testicular dysfunction 04/26/2007  . Diabetes ([place]) 12/24/2006  . Essential hypertension 12/24/2006   Past Medical History:    Diagnosis Date  . AAA (abdominal aortic aneurysm) ([place])   . Barrett's esophagus   . BPH (benign prostatic hypertrophy)   . CAD (coronary artery disease)    Post CABG in 2000.  LIMA to the LAD, SVG to OM, SVG to PDA, last perfusion study in 2012 with no high risk findings  . Colon polyps   . Diabetes mellitus    Type II  . Dyslipidemia   . Esophageal cancer ([place])   . Hearing loss   . Hypertension     Family History  Problem Relation Age of Onset  . Heart disease Mother   . Stroke Mother 60  . Lymphoma Father   . Cancer Sister        Uncertain  . Dementia Brother   . Frontotemporal dementia Brother   . Memory loss Sister   . Cervical cancer Sister   . Lymphoma Sister   . Skin cancer Sister   . Prostate cancer Brother   . Cancer Brother        cancer of the jaw  . Colon cancer Neg Hx     Past Surgical History:  Procedure Laterality Date  . CORONARY ARTERY BYPASS GRAFT  2000  . ESOPHAGECTOMY  2000  . [place] ARTHROPLASTY  1980  . VENTRAL HERNIA REPAIR  2001   Social History   Occupational History  . Occupation: retired  Tobacco Use  . Smoking status: Former Smoker    Packs/day: 1.50    Years: 35.00    Pack years: 52.50    Quit date: 05/25/1997    Years since quitting: 22.6  . Smokeless tobacco: Never Used  Vaping Use  . Vaping Use: Never used  Substance and Sexual Activity  . Alcohol use: Yes    Alcohol/week: 6.0 standard drinks    Types: 6 Glasses of wine per week    Comment: small glass of wine each night  . Drug use: No  . Sexual activity: Not Currently

## 2020-01-16 ENCOUNTER — Encounter: Payer: Self-pay | Admitting: Orthopaedic Surgery

## 2020-01-16 ENCOUNTER — Ambulatory Visit (INDEPENDENT_AMBULATORY_CARE_PROVIDER_SITE_OTHER): Payer: Medicare HMO | Admitting: Orthopaedic Surgery

## 2020-01-16 DIAGNOSIS — S62211A Bennett's fracture, right hand, initial encounter for closed fracture: Secondary | ICD-10-CM

## 2020-01-16 DIAGNOSIS — M79644 Pain in right finger(s): Secondary | ICD-10-CM | POA: Diagnosis not present

## 2020-01-16 NOTE — Progress Notes (Signed)
Office Visit Note   Patient: Gerald Martinez           Date of Birth: [birth year]           MRN: [id number] Visit Date: 01/16/2020              Requested by: Lesleigh Noe, MD [place],  [place] [address] PCP: Lesleigh Noe, MD   Assessment & Plan: Visit Diagnoses:  1. Closed Bennett's fracture of right thumb, initial encounter     Plan: Impression is right hand fracture to the base of the thumb.  We will continue to treat this nonoperatively.  We will place him in a removable thumb spica splint.  I do not believe he will need a built-up platform as he actually has a Rollator not a walker and typically ambulates without this.  He will follow-up with [place] in 2 weeks time for repeat evaluation three-view x-rays of the right thumb.  Follow-Up Instructions: Return in about 2 weeks (around 01/30/2020).   Orders:  No orders of the defined types were placed in this encounter.  No orders of the defined types were placed in this encounter.     Procedures: No procedures performed   Clinical Data: No additional findings.   Subjective: Chief Complaint  Patient presents with  . Right Thumb - Pain, Follow-up    HPI patient is a pleasant 86 year old gentleman who comes in today 6 days out from right hand basilar thumb fracture.  He has been doing well.  He has had minimal pain which is relieved with Tylenol.  He did tell me today that he only uses his Rollator when out in public.  He did not have any trouble using this while wearing the thumb spica splint.     Objective: Vital Signs: There were no vitals taken for this visit.    Ortho Exam examination of his right thumb reveals moderate ecchymosis.  He has slight tenderness to the fracture site his swelling has dramatically improved.  Fingers are warm and well-perfused.  Specialty Comments:  No specialty comments available.  Imaging: No new imaging   PMFS History: Patient Active Problem List   Diagnosis Date  Noted  . Weakness of left foot 01/02/2020  . Educated about COVID-19 virus infection 11/13/2019  . Typical atrial flutter ([place]) 03/28/2019  . Dyslipidemia 03/27/2019  . Insomnia 03/21/2019  . AAA (abdominal aortic aneurysm) ([place]) 10/26/2018  . Fatigue 09/07/2017  . Numbness 09/07/2017  . Bilateral impacted cerumen 06/15/2017  . Mixed conductive and sensorineural hearing loss of both ears 06/15/2017  . Tympanic membrane perforation, right 06/15/2017  . Low back pain 06/01/2017  . Balance problem 03/17/2017  . History of esophageal cancer 08/06/2016  . Pseudophakia of both eyes 01/22/2016  . Vitreomacular traction syndrome of left eye 01/22/2016  . Allergic rhinitis 01/25/2014  . Hyponatremia 05/05/2013  . GERD (gastroesophageal reflux disease) 09/01/2011  . Encounter for long-term (current) use of other medications 01/19/2011  . Overweight(278.02) 12/16/2010  . BPH (benign prostatic hyperplasia) 09/15/2010  . HYPERCHOLESTEROLEMIA 01/01/2010  . CORONARY ATHEROSCLEROSIS NATIVE CORONARY ARTERY 05/30/2009  . [place] PRESENTING HAZARDS HEALTH 05/30/2009  . RASH-NONVESICULAR 10/17/2008  . Iron deficiency anemia 01/16/2008  . OTHER DYSPHAGIA 08/23/2007  . Testicular dysfunction 04/26/2007  . Diabetes ([place]) 12/24/2006  . Essential hypertension 12/24/2006   Past Medical History:  Diagnosis Date  . AAA (abdominal aortic aneurysm) ([place])   . Barrett's esophagus   . BPH (  benign prostatic hypertrophy)   . CAD (coronary artery disease)    Post CABG in 2000.  LIMA to the LAD, SVG to OM, SVG to PDA, last perfusion study in 2012 with no high risk findings  . Colon polyps   . Diabetes mellitus    Type II  . Dyslipidemia   . Esophageal cancer ([place])   . Hearing loss   . Hypertension     Family History  Problem Relation Age of Onset  . Heart disease Mother   . Stroke Mother 39  . Lymphoma Father   . Cancer Sister        Uncertain  . Dementia Brother   . Frontotemporal  dementia Brother   . Memory loss Sister   . Cervical cancer Sister   . Lymphoma Sister   . Skin cancer Sister   . Prostate cancer Brother   . Cancer Brother        cancer of the jaw  . Colon cancer Neg Hx     Past Surgical History:  Procedure Laterality Date  . CORONARY ARTERY BYPASS GRAFT  2000  . ESOPHAGECTOMY  2000  . [place] ARTHROPLASTY  1980  . VENTRAL HERNIA REPAIR  2001   Social History   Occupational History  . Occupation: retired  Tobacco Use  . Smoking status: Former Smoker    Packs/day: 1.50    Years: 35.00    Pack years: 52.50    Quit date: 05/25/1997    Years since quitting: 22.6  . Smokeless tobacco: Never Used  Vaping Use  . Vaping Use: Never used  Substance and Sexual Activity  . Alcohol use: Yes    Alcohol/week: 6.0 standard drinks    Types: 6 Glasses of wine per week    Comment: small glass of wine each night  . Drug use: No  . Sexual activity: Not Currently

## 2020-01-25 DIAGNOSIS — R69 Illness, unspecified: Secondary | ICD-10-CM | POA: Diagnosis not present

## 2020-01-30 ENCOUNTER — Ambulatory Visit (INDEPENDENT_AMBULATORY_CARE_PROVIDER_SITE_OTHER): Payer: Medicare HMO

## 2020-01-30 ENCOUNTER — Encounter: Payer: Self-pay | Admitting: Orthopaedic Surgery

## 2020-01-30 ENCOUNTER — Ambulatory Visit (INDEPENDENT_AMBULATORY_CARE_PROVIDER_SITE_OTHER): Payer: Medicare HMO | Admitting: Orthopaedic Surgery

## 2020-01-30 DIAGNOSIS — S62211A Bennett's fracture, right hand, initial encounter for closed fracture: Secondary | ICD-10-CM | POA: Diagnosis not present

## 2020-01-30 NOTE — Progress Notes (Signed)
Office Visit Note   Patient: Gerald Martinez           Date of Birth: July 26, 1933           MRN: [id number] Visit Date: 01/30/2020              Requested by: Lesleigh Noe, MD [place],  [place] [id number] PCP: Lesleigh Noe, MD   Assessment & Plan: Visit Diagnoses:  1. Closed Bennett's fracture of right thumb, initial encounter     Plan: Impression is 3 weeks status post right first [facility] base fracture.  The patient continues to improve clinically.  He will wear his removable thumb spica splint for another week.  At this point, he can remove this, however if he notes increased pain to the fracture site he will reapply the splint.  He will follow-up with [place] in 3 weeks time for repeat evaluation three-view x-rays of the right thumb.  Call with concerns or questions in the meantime.  Follow-Up Instructions: Return in about 3 weeks (around 02/20/2020).   Orders:  Orders Placed This Encounter  Procedures  . XR Finger Thumb Right   No orders of the defined types were placed in this encounter.     Procedures: No procedures performed   Clinical Data: No additional findings.   Subjective: Chief Complaint  Patient presents with  . Right Thumb - Pain    HPI patient is a pleasant 86 year old gentleman who comes in today 3 weeks out right thumb first [facility] base fracture.  He has been doing okay.  His pain has dramatically improved.  He is taking Tylenol as needed.  He has been compliant wearing a removable thumb spica splint.     Objective: Vital Signs: There were no vitals taken for this visit.    Ortho Exam examination of the right hand shows no swelling.  Mild tenderness to the fracture site.  He is neurovascular intact distally.  Specialty Comments:  No specialty comments available.  Imaging: XR Finger Thumb Right  Result Date: 01/30/2020 X-rays demonstrate bony consolidation to the fracture at the first [facility] base    PMFS History: Patient Active  Problem List   Diagnosis Date Noted  . Weakness of left foot 01/02/2020  . Educated about COVID-19 virus infection 11/13/2019  . Typical atrial flutter ([place]) 03/28/2019  . Dyslipidemia 03/27/2019  . Insomnia 03/21/2019  . AAA (abdominal aortic aneurysm) ([place]) 10/26/2018  . Fatigue 09/07/2017  . Numbness 09/07/2017  . Bilateral impacted cerumen 06/15/2017  . Mixed conductive and sensorineural hearing loss of both ears 06/15/2017  . Tympanic membrane perforation, right 06/15/2017  . Low back pain 06/01/2017  . Balance problem 03/17/2017  . History of esophageal cancer 08/06/2016  . Pseudophakia of both eyes 01/22/2016  . Vitreomacular traction syndrome of left eye 01/22/2016  . Allergic rhinitis 01/25/2014  . Hyponatremia 05/05/2013  . GERD (gastroesophageal reflux disease) 09/01/2011  . Encounter for long-term (current) use of other medications 01/19/2011  . Overweight(278.02) 12/16/2010  . BPH (benign prostatic hyperplasia) 09/15/2010  . HYPERCHOLESTEROLEMIA 01/01/2010  . CORONARY ATHEROSCLEROSIS NATIVE CORONARY ARTERY 05/30/2009  . [place] PRESENTING HAZARDS HEALTH 05/30/2009  . RASH-NONVESICULAR 10/17/2008  . Iron deficiency anemia 01/16/2008  . OTHER DYSPHAGIA 08/23/2007  . Testicular dysfunction 04/26/2007  . Diabetes ([place]) 12/24/2006  . Essential hypertension 12/24/2006   Past Medical History:  Diagnosis Date  . AAA (abdominal aortic aneurysm) ([place])   . Barrett's esophagus   .  BPH (benign prostatic hypertrophy)   . CAD (coronary artery disease)    Post CABG in 2000.  LIMA to the LAD, SVG to OM, SVG to PDA, last perfusion study in 2012 with no high risk findings  . Colon polyps   . Diabetes mellitus    Type II  . Dyslipidemia   . Esophageal cancer ([place])   . Hearing loss   . Hypertension     Family History  Problem Relation Age of Onset  . Heart disease Mother   . Stroke Mother 59  . Lymphoma Father   . Cancer Sister        Uncertain  . Dementia  Brother   . Frontotemporal dementia Brother   . Memory loss Sister   . Cervical cancer Sister   . Lymphoma Sister   . Skin cancer Sister   . Prostate cancer Brother   . Cancer Brother        cancer of the jaw  . Colon cancer Neg Hx     Past Surgical History:  Procedure Laterality Date  . CORONARY ARTERY BYPASS GRAFT  2000  . ESOPHAGECTOMY  2000  . [place] ARTHROPLASTY  1980  . VENTRAL HERNIA REPAIR  2001   Social History   Occupational History  . Occupation: retired  Tobacco Use  . Smoking status: Former Smoker    Packs/day: 1.50    Years: 35.00    Pack years: 52.50    Quit date: 05/25/1997    Years since quitting: 22.6  . Smokeless tobacco: Never Used  Vaping Use  . Vaping Use: Never used  Substance and Sexual Activity  . Alcohol use: Yes    Alcohol/week: 6.0 standard drinks    Types: 6 Glasses of wine per week    Comment: small glass of wine each night  . Drug use: No  . Sexual activity: Not Currently

## 2020-02-27 ENCOUNTER — Ambulatory Visit: Payer: Self-pay

## 2020-02-27 ENCOUNTER — Ambulatory Visit (INDEPENDENT_AMBULATORY_CARE_PROVIDER_SITE_OTHER): Payer: Medicare HMO | Admitting: Orthopaedic Surgery

## 2020-02-27 ENCOUNTER — Encounter: Payer: Self-pay | Admitting: Orthopaedic Surgery

## 2020-02-27 DIAGNOSIS — S62211A Bennett's fracture, right hand, initial encounter for closed fracture: Secondary | ICD-10-CM

## 2020-02-27 MED ORDER — LIDOCAINE 5 % EX PTCH
1.0000 | MEDICATED_PATCH | CUTANEOUS | 11 refills | Status: DC
Start: 2020-02-27 — End: 2020-03-05

## 2020-02-27 MED ORDER — LIDOCAINE 5 % EX PTCH: 1.0000 | MEDICATED_PATCH | CUTANEOUS | 0 refills | Status: DC

## 2020-02-27 NOTE — Progress Notes (Signed)
Office Visit Note   Patient: Gerald Martinez           Date of Birth: Jul 28, 1933           MRN: [id number] Visit Date: 02/27/2020              Requested by: Lesleigh Noe, MD [place],  [place] [address] PCP: Lesleigh Noe, MD   Assessment & Plan: Visit Diagnoses:  1. Closed Bennett's fracture of right thumb, initial encounter     Plan: Impression is 7 weeks status post right Bennett's fracture.  He has healed this very well.  At this point we will release him to activity as tolerated.  Lidocaine patches refill for his proximal hamstring tendinosis.  Follow-up as needed.  Follow-Up Instructions: Return if symptoms worsen or fail to improve.   Orders:  Orders Placed This Encounter  Procedures  . XR Finger Thumb Right   Meds ordered this encounter  Medications  . DISCONTD: lidocaine (LIDODERM) 5 %    Sig: Place 1 patch onto the skin daily. Remove & Discard patch within 12 hours or as directed by MD    Dispense:  30 patch    Refill:  0  . lidocaine (LIDODERM) 5 %    Sig: Place 1 patch onto the skin daily. Remove & Discard patch within 12 hours or as directed by MD    Dispense:  30 patch    Refill:  11      Procedures: No procedures performed   Clinical Data: No additional findings.   Subjective: Chief Complaint  Patient presents with  . Right Thumb - Pain, Follow-up    Gerald Martinez returns today for follow-up of his Bennetts fracture.  He is doing much better.  No real complaints with thumb.   Review of Systems   Objective: Vital Signs: There were no vitals taken for this visit.  Physical Exam  Ortho Exam Right thumb shows good grip strength good pinch strength without pain.  Negative grind test. Specialty Comments:  No specialty comments available.  Imaging: XR Finger Thumb Right  Result Date: 02/27/2020 Significant healing of thumb metacarpal fracture    PMFS History: Patient Active Problem List   Diagnosis Date Noted  .  Weakness of left foot 01/02/2020  . Educated about COVID-19 virus infection 11/13/2019  . Typical atrial flutter ([place]) 03/28/2019  . Dyslipidemia 03/27/2019  . Insomnia 03/21/2019  . AAA (abdominal aortic aneurysm) ([place]) 10/26/2018  . Fatigue 09/07/2017  . Numbness 09/07/2017  . Bilateral impacted cerumen 06/15/2017  . Mixed conductive and sensorineural hearing loss of both ears 06/15/2017  . Tympanic membrane perforation, right 06/15/2017  . Low back pain 06/01/2017  . Balance problem 03/17/2017  . History of esophageal cancer 08/06/2016  . Pseudophakia of both eyes 01/22/2016  . Vitreomacular traction syndrome of left eye 01/22/2016  . Allergic rhinitis 01/25/2014  . Hyponatremia 05/05/2013  . GERD (gastroesophageal reflux disease) 09/01/2011  . Encounter for long-term (current) use of other medications 01/19/2011  . Overweight(278.02) 12/16/2010  . BPH (benign prostatic hyperplasia) 09/15/2010  . HYPERCHOLESTEROLEMIA 01/01/2010  . CORONARY ATHEROSCLEROSIS NATIVE CORONARY ARTERY 05/30/2009  . [place] PRESENTING HAZARDS HEALTH 05/30/2009  . RASH-NONVESICULAR 10/17/2008  . Iron deficiency anemia 01/16/2008  . OTHER DYSPHAGIA 08/23/2007  . Testicular dysfunction 04/26/2007  . Diabetes ([place]) 12/24/2006  . Essential hypertension 12/24/2006   Past Medical History:  Diagnosis Date  . AAA (abdominal aortic aneurysm) ([place])   .  Barrett's esophagus   . BPH (benign prostatic hypertrophy)   . CAD (coronary artery disease)    Post CABG in 2000.  LIMA to the LAD, SVG to OM, SVG to PDA, last perfusion study in 2012 with no high risk findings  . Colon polyps   . Diabetes mellitus    Type II  . Dyslipidemia   . Esophageal cancer ([place])   . Hearing loss   . Hypertension     Family History  Problem Relation Age of Onset  . Heart disease Mother   . Stroke Mother 90  . Lymphoma Father   . Cancer Sister        Uncertain  . Dementia Brother   . Frontotemporal dementia  Brother   . Memory loss Sister   . Cervical cancer Sister   . Lymphoma Sister   . Skin cancer Sister   . Prostate cancer Brother   . Cancer Brother        cancer of the jaw  . Colon cancer Neg Hx     Past Surgical History:  Procedure Laterality Date  . CORONARY ARTERY BYPASS GRAFT  2000  . ESOPHAGECTOMY  2000  . [place] ARTHROPLASTY  1980  . VENTRAL HERNIA REPAIR  2001   Social History   Occupational History  . Occupation: retired  Tobacco Use  . Smoking status: Former Smoker    Packs/day: 1.50    Years: 35.00    Pack years: 52.50    Quit date: 05/25/1997    Years since quitting: 22.7  . Smokeless tobacco: Never Used  Vaping Use  . Vaping Use: Never used  Substance and Sexual Activity  . Alcohol use: Yes    Alcohol/week: 6.0 standard drinks    Types: 6 Glasses of wine per week    Comment: small glass of wine each night  . Drug use: No  . Sexual activity: Not Currently

## 2020-02-29 ENCOUNTER — Other Ambulatory Visit: Payer: Self-pay | Admitting: Cardiology

## 2020-03-01 NOTE — Telephone Encounter (Signed)
Prescription refill request for Eliquis received. Indication:  Atrial Flutter Last office visit: 10/2019  Hochrein Scr:  0.98 08/2019 Age: 86 Weight: 90.3 kg  Prescription refilled

## 2020-03-05 ENCOUNTER — Other Ambulatory Visit: Payer: Self-pay | Admitting: Physician Assistant

## 2020-03-05 ENCOUNTER — Telehealth: Payer: Self-pay | Admitting: Orthopaedic Surgery

## 2020-03-05 MED ORDER — LIDOCAINE 5 % EX PTCH
1.0000 | MEDICATED_PATCH | CUTANEOUS | 11 refills | Status: DC
Start: 2020-03-05 — End: 2020-03-19

## 2020-03-05 NOTE — Telephone Encounter (Signed)
Patient called. Says he need more Lidocaine patches and [employer] needs an approval from Dr. Erlinda Hong. [employer]'s number is [phone]. Patient's call back number is [phone]

## 2020-03-05 NOTE — Telephone Encounter (Signed)
Can you send in more Lidocaine patches?

## 2020-03-05 NOTE — Telephone Encounter (Signed)
done

## 2020-03-09 ENCOUNTER — Other Ambulatory Visit: Payer: Self-pay | Admitting: Family Medicine

## 2020-03-09 DIAGNOSIS — E7800 Pure hypercholesterolemia, unspecified: Secondary | ICD-10-CM

## 2020-03-09 DIAGNOSIS — Z125 Encounter for screening for malignant neoplasm of prostate: Secondary | ICD-10-CM

## 2020-03-09 DIAGNOSIS — E299 Testicular dysfunction, unspecified: Secondary | ICD-10-CM

## 2020-03-09 DIAGNOSIS — I10 Essential (primary) hypertension: Secondary | ICD-10-CM

## 2020-03-11 NOTE — Telephone Encounter (Signed)
Refill provided  Please have patient schedule testosterone lab visit   If still low will likely recommend follow-up visit.

## 2020-03-12 ENCOUNTER — Telehealth: Payer: Self-pay

## 2020-03-12 NOTE — Telephone Encounter (Signed)
Patient called stating that an approval is needed for Lidocaine patches.  Cb# [phone].  Please advise.  Thank you.

## 2020-03-13 NOTE — Addendum Note (Signed)
Addended by: Waunita Schooner R on: 03/13/2020 12:27 PM   Modules accepted: Orders

## 2020-03-13 NOTE — Telephone Encounter (Signed)
PA done through [email or web address]   Gerald Martinez (Key: [id number])  Your information has been submitted to [place] Medicare Part D. [employer] Part D will review the request and will issue a decision, typically within 1-3 days from your submission. You can check the updated outcome later by reopening this request.  If [employer] has not responded in 1-3 days or if you have any questions about your ePA request, please contact [place] Medicare Part D at [phone]. If you think there may be a problem with your PA request, use our live chat feature at the bottom right.

## 2020-03-13 NOTE — Telephone Encounter (Signed)
Called patient to schedule lab visit. Unable to lvm.

## 2020-03-13 NOTE — Telephone Encounter (Signed)
Added on liver and kidney function and cholesterol  Also added PSA - please confirm with patient that he would like prostate cancer screening.

## 2020-03-13 NOTE — Telephone Encounter (Signed)
Pending PA

## 2020-03-13 NOTE — Telephone Encounter (Signed)
Patient called in and scheduled for lab appointment. Patient wondering if any other labs need to be added as well.

## 2020-03-14 NOTE — Telephone Encounter (Signed)
Check the status of this PA.   This request has received an Unfavorable outcome.  An eAppeal may be available. View the bottom of the request to see if an eAppeal is available along with any additional information provided by [employer] D.

## 2020-03-15 ENCOUNTER — Telehealth: Payer: Self-pay | Admitting: Orthopaedic Surgery

## 2020-03-15 ENCOUNTER — Other Ambulatory Visit (INDEPENDENT_AMBULATORY_CARE_PROVIDER_SITE_OTHER): Payer: Medicare HMO

## 2020-03-15 ENCOUNTER — Other Ambulatory Visit: Payer: Self-pay

## 2020-03-15 DIAGNOSIS — E7800 Pure hypercholesterolemia, unspecified: Secondary | ICD-10-CM

## 2020-03-15 DIAGNOSIS — I10 Essential (primary) hypertension: Secondary | ICD-10-CM

## 2020-03-15 DIAGNOSIS — E299 Testicular dysfunction, unspecified: Secondary | ICD-10-CM | POA: Diagnosis not present

## 2020-03-15 DIAGNOSIS — Z125 Encounter for screening for malignant neoplasm of prostate: Secondary | ICD-10-CM

## 2020-03-15 LAB — COMPREHENSIVE METABOLIC PANEL
ALT: 27 U/L (ref 0–53)
AST: 25 U/L (ref 0–37)
Albumin: 4.1 g/dL (ref 3.5–5.2)
Alkaline Phosphatase: 89 U/L (ref 39–117)
BUN: 22 mg/dL (ref 6–23)
CO2: 30 mEq/L (ref 19–32)
Calcium: 9.4 mg/dL (ref 8.4–10.5)
Chloride: 99 mEq/L (ref 96–112)
Creatinine, Ser: 1.02 mg/dL (ref 0.40–1.50)
GFR: 67.75 mL/min (ref >60.00)
Glucose, Bld: 205 mg/dL — ABNORMAL HIGH (ref 70–99)
Potassium: 4.5 mEq/L (ref 3.5–5.1)
Sodium: 137 mEq/L (ref 135–145)
Total Bilirubin: 1.2 mg/dL (ref 0.2–1.2)
Total Protein: 6.5 g/dL (ref 6.0–8.3)

## 2020-03-15 LAB — LIPID PANEL
Cholesterol: 112 mg/dL (ref 0–200)
HDL: 51.8 mg/dL (ref >39.00)
LDL Cholesterol: 46 mg/dL (ref 0–99)
NonHDL: 60.37
Total CHOL/HDL Ratio: 2
Triglycerides: 70 mg/dL (ref 0.0–149.0)
VLDL: 14 mg/dL (ref 0.0–40.0)

## 2020-03-15 LAB — TESTOSTERONE: Testosterone: 237.94 ng/dL — ABNORMAL LOW (ref 300.00–890.00)

## 2020-03-15 LAB — PSA, MEDICARE: PSA: 1.66 ng/ml (ref 0.10–4.00)

## 2020-03-15 NOTE — Telephone Encounter (Signed)
[employer] called. Needing information on patient for the RX for the patch. Call back number is [phone]

## 2020-03-15 NOTE — Telephone Encounter (Signed)
Patient's wife Gerald Martinez asked is there a generic lidocaine patch that the insurance may cover. She states that her husband really need those patches. Please call [name] at [phone].

## 2020-03-15 NOTE — Telephone Encounter (Signed)
Called the number below. Transferred multiple times. States ins will not cover Lidocaine patches(denied). Called patient to let him know.no answer. Could not leave VM. Mailbox is full.

## 2020-03-15 NOTE — Telephone Encounter (Signed)
Pt CB stating if we say the pt needs this rx because he has diabetes( which he does have) they will fill it for him so he doesn't have to get the  generic brand. Pt would like a CB to let him know wether we will be able to do this  [phone]

## 2020-03-15 NOTE — Telephone Encounter (Signed)
FYI-See other msg regarding this.

## 2020-03-18 NOTE — Telephone Encounter (Signed)
Not sure what to send in. Can you send to his pharm. Thanks.

## 2020-03-18 NOTE — Telephone Encounter (Signed)
Sent reply to other message

## 2020-03-18 NOTE — Telephone Encounter (Signed)
Ok to do

## 2020-03-18 NOTE — Telephone Encounter (Signed)
Same rx right?  Just tell pharmacist he is diabetic?  If that does not work, I guess he will need to get pcp to write and say it is for diabetes

## 2020-03-19 ENCOUNTER — Ambulatory Visit (INDEPENDENT_AMBULATORY_CARE_PROVIDER_SITE_OTHER): Payer: Medicare HMO | Admitting: Family Medicine

## 2020-03-19 ENCOUNTER — Encounter: Payer: Self-pay | Admitting: Family Medicine

## 2020-03-19 ENCOUNTER — Other Ambulatory Visit: Payer: Self-pay

## 2020-03-19 VITALS — BP 122/58 | HR 95 | Temp 98.5°F | Ht 69.0 in | Wt 198.0 lb

## 2020-03-19 DIAGNOSIS — E7800 Pure hypercholesterolemia, unspecified: Secondary | ICD-10-CM

## 2020-03-19 DIAGNOSIS — M545 Low back pain, unspecified: Secondary | ICD-10-CM | POA: Diagnosis not present

## 2020-03-19 DIAGNOSIS — E299 Testicular dysfunction, unspecified: Secondary | ICD-10-CM | POA: Diagnosis not present

## 2020-03-19 DIAGNOSIS — G8929 Other chronic pain: Secondary | ICD-10-CM | POA: Diagnosis not present

## 2020-03-19 MED ORDER — LIDOCAINE 5 % EX PTCH: 1.0000 | MEDICATED_PATCH | CUTANEOUS | 11 refills | Status: DC

## 2020-03-19 MED ORDER — TESTOSTERONE 50 MG/5GM (1%) TD GEL: 10.0000 g | Freq: Every day | TRANSDERMAL | 1 refills | Status: DC

## 2020-03-19 NOTE — Assessment & Plan Note (Signed)
Pt with chronic low back pain and comorbidity impacting pain options. Would benefit from lidocaine patch to limit sedation and kidney risk of NSAIDs.  Continue regular exercise.

## 2020-03-19 NOTE — Assessment & Plan Note (Addendum)
Testosterone is low and he notes some fatigue. Will increase dose of testosterone from 5 g to 10 g. Return for recheck in 2 weeks. If improved f/u in 3-6 months.

## 2020-03-19 NOTE — Telephone Encounter (Signed)
Called patient no answer. Could not leave VM. Mailbox is full.   See Lindsey's second part of message.

## 2020-03-19 NOTE — Assessment & Plan Note (Signed)
LDL at goal for DM. Cont simvastatin 40 mg

## 2020-03-19 NOTE — Patient Instructions (Signed)
Increase testosterone  Return for lab visit in 2 weeks for testosterone -- between 8-10 am   Return in 6 months for medicare wellness visit or sooner as needed  Topical Voltaren Gel -- topical anti-inflammatory pain medication

## 2020-03-19 NOTE — Progress Notes (Signed)
Subjective:     Gerald Martinez is a 86 y.o. male presenting for Follow-up (6 mo)     HPI  #hypogonadism - feels like the dose is working sort of  - is taking 1 tube currently - was previously on 2 tubes  #back pain - started gabapentin at night which helped with pain and sleep - occurred with fall on the buttock - ortho tried opiates but he had side effects - OTC lidocaine patches were helpful but not sufficient - would like prescription    Review of Systems   Social History   Tobacco Use  Smoking Status Former Smoker  . Packs/day: 1.50  . Years: 35.00  . Pack years: 52.50  . Quit date: 05/25/1997  . Years since quitting: 22.8  Smokeless Tobacco Never Used        Objective:    BP Readings from Last 3 Encounters:  03/19/20 (!) 122/58  01/02/20 120/62  12/27/19 130/80   Wt Readings from Last 3 Encounters:  03/19/20 198 lb (89.8 kg)  01/12/20 199 lb (90.3 kg)  01/02/20 198 lb 4 oz (89.9 kg)    BP (!) 122/58   Pulse 95   Temp 98.5 F (36.9 C) (Temporal)   Ht 5\' 9"  (1.753 m)   Wt 198 lb (89.8 kg)   SpO2 (!) 84%   BMI 29.24 kg/m    Physical Exam Constitutional:      Appearance: Normal appearance. He is not ill-appearing or diaphoretic.  HENT:     Right Ear: External ear normal.     Left Ear: External ear normal.     Nose: Nose normal.  Eyes:     General: No scleral icterus.    Extraocular Movements: Extraocular movements intact.     Conjunctiva/sclera: Conjunctivae normal.  Cardiovascular:     Rate and Rhythm: Normal rate and regular rhythm.  Pulmonary:     Effort: Pulmonary effort is normal.     Breath sounds: Normal breath sounds.  Musculoskeletal:     Cervical back: Neck supple.     Comments: Ambulates with walker LE strength normal  Skin:    General: Skin is warm and dry.  Neurological:     Mental Status: He is alert. Mental status is at baseline.  Psychiatric:        Mood and Affect: Mood normal.           Assessment &  Plan:   Problem List Items Addressed This Visit      Endocrine   Testicular dysfunction    Testosterone is low and he notes some fatigue. Will increase dose of testosterone from 5 g to 10 g. Return for recheck in 2 weeks. If improved f/u in 3-6 months.       Relevant Medications   testosterone (ANDROGEL) 50 MG/5GM (1%) GEL   Other Relevant Orders   Testosterone     Other   HYPERCHOLESTEROLEMIA    LDL at goal for DM. Cont simvastatin 40 mg      Low back pain - Primary    Pt with chronic low back pain and comorbidity impacting pain options. Would benefit from lidocaine patch to limit sedation and kidney risk of NSAIDs.  Continue regular exercise.       Relevant Medications   lidocaine (LIDODERM) 5 %       Return in about 6 months (around 09/17/2020) for wellness visit.  Lesleigh Noe, MD  This visit occurred during the SARS-CoV-2 public health emergency.  Safety protocols were in place, including screening questions prior to the visit, additional usage of staff PPE, and extensive cleaning of exam room while observing appropriate contact time as indicated for disinfecting solutions.

## 2020-03-20 ENCOUNTER — Encounter: Payer: Self-pay | Admitting: Podiatry

## 2020-03-20 ENCOUNTER — Ambulatory Visit: Payer: Medicare HMO | Admitting: Podiatry

## 2020-03-20 DIAGNOSIS — M79676 Pain in unspecified toe(s): Secondary | ICD-10-CM | POA: Diagnosis not present

## 2020-03-20 DIAGNOSIS — B351 Tinea unguium: Secondary | ICD-10-CM | POA: Diagnosis not present

## 2020-03-20 DIAGNOSIS — E1140 Type 2 diabetes mellitus with diabetic neuropathy, unspecified: Secondary | ICD-10-CM

## 2020-03-20 NOTE — Progress Notes (Signed)
This patient returns to my office for at risk foot care.  This patient requires this care by a professional since this patient will be at risk due to having diabetes and coagulation defect..    This patient is unable to cut nails himself since the patient cannot reach his nails.These nails are painful walking and wearing shoes.  This patient presents for at risk foot care today.  General Appearance  Alert, conversant and in no acute stress.  Vascular  Dorsalis pedis  are palpable  bilaterally.  Posterior tibial pulses are absent  B/L. Capillary return is within normal limits  bilaterally. Temperature is within normal limits  Bilaterally. Venous stasis feet/legs  B/L.  Neurologic  Senn-Weinstein monofilament wire test within normal limits  bilaterally. Muscle power within normal limits bilaterally.  Nails Thick disfigured discolored nails with subungual debris  from hallux to fifth toes bilaterally. No evidence of bacterial infection or drainage bilaterally.  Orthopedic  No limitations of motion  feet .  No crepitus or effusions noted.  Hammer toes  B/l.  Skin  normotropic skin with no porokeratosis noted bilaterally.  No signs of infections or ulcers noted.     Onychomycosis  Pain in right toes  Pain in left toes  Consent was obtained for treatment procedures.   Mechanical debridement of nails 1-5  bilaterally performed with a nail nipper.  Filed with dremel without incident.    Return office visit    3 months                 Told patient to return for periodic foot care and evaluation due to potential at risk complications.   Gardiner Barefoot DPM

## 2020-03-23 ENCOUNTER — Ambulatory Visit: Payer: Medicare HMO | Attending: Internal Medicine

## 2020-03-23 ENCOUNTER — Other Ambulatory Visit: Payer: Self-pay

## 2020-03-23 DIAGNOSIS — Z23 Encounter for immunization: Secondary | ICD-10-CM

## 2020-03-23 NOTE — Progress Notes (Signed)
   Covid-19 Vaccination Clinic  Name:  SALIOU BARNIER    MRN: [id number] DOB: 01-05-1934  03/23/2020  Mr. Settlemire was observed post Covid-19 immunization for 15 minutes without incident. He was provided with Vaccine Information Sheet and instruction to access the V-Safe system.   Mr. Baines was instructed to call 911 with any severe reactions post vaccine: Marland Kitchen Difficulty breathing  . Swelling of face and throat  . A fast heartbeat  . A bad rash all over body  . Dizziness and weakness

## 2020-03-29 DIAGNOSIS — H938X1 Other specified disorders of right ear: Secondary | ICD-10-CM | POA: Diagnosis not present

## 2020-03-29 DIAGNOSIS — H7291 Unspecified perforation of tympanic membrane, right ear: Secondary | ICD-10-CM | POA: Diagnosis not present

## 2020-03-29 DIAGNOSIS — H90A31 Mixed conductive and sensorineural hearing loss, unilateral, right ear with restricted hearing on the contralateral side: Secondary | ICD-10-CM | POA: Diagnosis not present

## 2020-03-29 DIAGNOSIS — H90A22 Sensorineural hearing loss, unilateral, left ear, with restricted hearing on the contralateral side: Secondary | ICD-10-CM | POA: Diagnosis not present

## 2020-03-29 DIAGNOSIS — H7402 Tympanosclerosis, left ear: Secondary | ICD-10-CM | POA: Diagnosis not present

## 2020-04-02 ENCOUNTER — Other Ambulatory Visit: Payer: Medicare HMO

## 2020-04-05 ENCOUNTER — Other Ambulatory Visit (INDEPENDENT_AMBULATORY_CARE_PROVIDER_SITE_OTHER): Payer: Medicare HMO

## 2020-04-05 ENCOUNTER — Other Ambulatory Visit: Payer: Self-pay

## 2020-04-05 DIAGNOSIS — E299 Testicular dysfunction, unspecified: Secondary | ICD-10-CM | POA: Diagnosis not present

## 2020-04-05 LAB — TESTOSTERONE: Testosterone: 271.2 ng/dL — ABNORMAL LOW (ref 300.00–890.00)

## 2020-04-08 ENCOUNTER — Other Ambulatory Visit: Payer: Medicare HMO

## 2020-04-09 ENCOUNTER — Other Ambulatory Visit: Payer: Self-pay | Admitting: Family Medicine

## 2020-04-09 DIAGNOSIS — E299 Testicular dysfunction, unspecified: Secondary | ICD-10-CM

## 2020-04-09 MED ORDER — TESTOSTERONE 50 MG/5GM (1%) TD GEL: 10.0000 g | Freq: Every day | TRANSDERMAL | 1 refills | Status: DC

## 2020-04-09 NOTE — Progress Notes (Signed)
Refill sent to pharmacy.   

## 2020-04-16 ENCOUNTER — Ambulatory Visit (INDEPENDENT_AMBULATORY_CARE_PROVIDER_SITE_OTHER): Payer: Medicare HMO | Admitting: Family Medicine

## 2020-04-16 ENCOUNTER — Encounter: Payer: Self-pay | Admitting: Family Medicine

## 2020-04-16 ENCOUNTER — Other Ambulatory Visit: Payer: Self-pay

## 2020-04-16 VITALS — BP 120/68 | HR 60 | Temp 98.0°F | Ht 69.0 in | Wt 198.0 lb

## 2020-04-16 DIAGNOSIS — J309 Allergic rhinitis, unspecified: Secondary | ICD-10-CM | POA: Diagnosis not present

## 2020-04-16 DIAGNOSIS — E299 Testicular dysfunction, unspecified: Secondary | ICD-10-CM

## 2020-04-16 DIAGNOSIS — I10 Essential (primary) hypertension: Secondary | ICD-10-CM | POA: Diagnosis not present

## 2020-04-16 DIAGNOSIS — H7291 Unspecified perforation of tympanic membrane, right ear: Secondary | ICD-10-CM | POA: Diagnosis not present

## 2020-04-16 NOTE — Assessment & Plan Note (Signed)
Trial of flonase given his concern for nasal drainage impacting his ear. If no improvement try claritin.

## 2020-04-16 NOTE — Assessment & Plan Note (Signed)
Stable. Follows with ENT and getting hearing evaluation. No vertigo today

## 2020-04-16 NOTE — Progress Notes (Signed)
Subjective:     Gerald Martinez is a 86 y.o. male presenting for Ear Fullness (R x 2 months ) and Dizziness (when ear gets full feeling )     HPI   # Ear fullness - has had his ears cleaned out w/o help - has a hearing test in the future - right side is worse than left - hearing and fullness - endorses congestion the right side - drainage - left nostril remains try - saw ENT on 03/29/2020 and has his ear wax removed - has small right sided TM perforation  - vertigo occurs a few times a year  Review of Systems   Social History   Tobacco Use  Smoking Status Former Smoker  . Packs/day: 1.50  . Years: 35.00  . Pack years: 52.50  . Quit date: 05/25/1997  . Years since quitting: 22.9  Smokeless Tobacco Never Used        Objective:    BP Readings from Last 3 Encounters:  04/16/20 120/68  03/19/20 (!) 122/58  01/02/20 120/62   Wt Readings from Last 3 Encounters:  04/16/20 198 lb (89.8 kg)  03/19/20 198 lb (89.8 kg)  01/12/20 199 lb (90.3 kg)    BP 120/68   Pulse 60   Temp 98 F (36.7 C) (Temporal)   Ht 5\' 9"  (1.753 m)   Wt 198 lb (89.8 kg)   SpO2 97%   BMI 29.24 kg/m    Physical Exam Constitutional:      Appearance: Normal appearance. He is not ill-appearing or diaphoretic.  HENT:     Right Ear: External ear normal. Tympanic membrane is perforated. Tympanic membrane is not erythematous or retracted.     Left Ear: External ear normal. Tympanic membrane is scarred.     Nose: Nose normal.  Eyes:     General: No scleral icterus.    Extraocular Movements: Extraocular movements intact.     Conjunctiva/sclera: Conjunctivae normal.  Cardiovascular:     Rate and Rhythm: Normal rate.  Pulmonary:     Effort: Pulmonary effort is normal.  Musculoskeletal:     Cervical back: Neck supple.  Skin:    General: Skin is warm and dry.  Neurological:     Mental Status: He is alert. Mental status is at baseline.  Psychiatric:        Mood and Affect: Mood normal.            Assessment & Plan:   Problem List Items Addressed This Visit      Cardiovascular and Mediastinum   Essential hypertension    BP at goal. Cont losartan 100 mg daily and doxazosin 4 mg        Respiratory   Allergic rhinitis    Trial of flonase given his concern for nasal drainage impacting his ear. If no improvement try claritin.         Endocrine   Testicular dysfunction    Last testosterone was still low, though pt feels his symptoms and energy are doing well on current dose. Cont testosterone 10 g gel daily        Nervous and Auditory   Tympanic membrane perforation, right - Primary    Stable. Follows with ENT and getting hearing evaluation. No vertigo today          Return in about 3 months (around 07/17/2020) for medicare wellness visit.  Lesleigh Noe, MD  This visit occurred during the SARS-CoV-2 public health emergency.  Safety protocols were  in place, including screening questions prior to the visit, additional usage of staff PPE, and extensive cleaning of exam room while observing appropriate contact time as indicated for disinfecting solutions.

## 2020-04-16 NOTE — Assessment & Plan Note (Signed)
Last testosterone was still low, though pt feels his symptoms and energy are doing well on current dose. Cont testosterone 10 g gel daily

## 2020-04-16 NOTE — Assessment & Plan Note (Signed)
BP at goal. Cont losartan 100 mg daily and doxazosin 4 mg

## 2020-04-16 NOTE — Patient Instructions (Signed)
Ear fullness  - try Flonase once daily for 1-2 weeks - if working, continue taking - if not working, stop and try taking Claritin or zyrtec (store brand)

## 2020-04-22 ENCOUNTER — Other Ambulatory Visit: Payer: Self-pay | Admitting: Family Medicine

## 2020-05-08 NOTE — Progress Notes (Signed)
Cardiology Office Note   Date:  05/09/2020   ID:  Gerald Martinez, DOB [birth year], MRN [id number]  PCP:  Lesleigh Noe, MD  Cardiologist:   Minus Breeding, MD   Chief Complaint  Patient presents with  . Atrial Flutter     History of Present Illness: Gerald Martinez is a 86 y.o. male who presents for follow up of atrial fibrillation and coronary disease.  Since I last saw him his brother died.  His son was also diagnosed with pancreatic cancer and died within 73 weeks of the diagnosis.  The patient walks with his walker.  He denies any new cardiovascular symptoms.  He has no chest pressure, neck or arm discomfort.  He walks slowly with a walker.  He denies vertigo but says that this is clearly not presyncope or syncope.  He has no symptoms related to his atrial flutter.  He does not feel palpitations.  He has no chest pressure, neck or arm discomfort.  He has had no shortness of breath, weight gain or edema.    Past Medical History:  Diagnosis Date  . AAA (abdominal aortic aneurysm) ([place])   . Barrett's esophagus   . BPH (benign prostatic hypertrophy)   . CAD (coronary artery disease)    Post CABG in 2000.  LIMA to the LAD, SVG to OM, SVG to PDA, last perfusion study in 2012 with no high risk findings  . Colon polyps   . Diabetes mellitus    Type II  . Dyslipidemia   . Esophageal cancer ([place])   . Hearing loss   . Hypertension     Past Surgical History:  Procedure Laterality Date  . CORONARY ARTERY BYPASS GRAFT  2000  . ESOPHAGECTOMY  2000  . [place] ARTHROPLASTY  1980  . VENTRAL HERNIA REPAIR  2001     Current Outpatient Medications  Medication Sig Dispense Refill  . blood glucose meter kit and supplies One touch ultra 2-Check sugar once daily DX E11.59 1 each 12  . BOOSTRIX 5-2.5-18.5 LF-MCG/0.5 injection     . doxazosin (CARDURA) 4 MG tablet TAKE 1 TABLET BY MOUTH DAILY. 90 tablet 3  . ELIQUIS 5 MG TABS tablet TAKE 1 TABLET BY MOUTH 2 TIMES DAILY. 180 tablet 1   . gabapentin (NEURONTIN) 100 MG capsule Take 100 mg by mouth as needed.    Marland Kitchen glucose blood (ONETOUCH ULTRA) test strip Check sugar once daily DX E11.59 100 strip 2  . ketoconazole (NIZORAL) 2 % cream Apply topically 2 (two) times daily.    Marland Kitchen losartan (COZAAR) 50 MG tablet TAKE 1 TABLET (50 MG TOTAL) BY MOUTH DAILY. (Patient taking differently: Take 100 mg by mouth daily. Take 2 tablets daily) 90 tablet 1  . Melatonin 10 MG TABS Take 1 tablet by mouth as needed.    . metFORMIN (GLUCOPHAGE-XR) 500 MG 24 hr tablet Take 1 tablet (500 mg total) by mouth in the morning and at bedtime. (Patient taking differently: Take 1,000 mg by mouth in the morning and at bedtime.) 180 tablet 2  . Multiple Vitamin (MULTIVITAMIN) tablet Take 1 tablet by mouth daily.    . Multiple Vitamins-Minerals (CENTRUM ADULTS) TABS Take by mouth.    Marland Kitchen omeprazole (PRILOSEC) 20 MG capsule TAKE 1 CAPSULE BY MOUTH DAILY. 90 capsule 3  . OneTouch Delica Lancets 34K MISC Check sugar once daily DX E11.59 100 each 2  . simvastatin (ZOCOR) 40 MG tablet TAKE 1 TABLET (40 MG TOTAL) BY MOUTH  AT BEDTIME. 90 tablet 3  . testosterone (ANDROGEL) 50 MG/5GM (1%) GEL Place 10 g onto the skin daily. 900 g 1  . traZODone (DESYREL) 50 MG tablet Take 1 tablet (50 mg total) by mouth at bedtime as needed for sleep. 30 tablet 3   No current facility-administered medications for this visit.    Allergies:   Propoxyphene n-acetaminophen    ROS:  Please see the history of present illness.   Otherwise, review of systems are positive for none.   All other systems are reviewed and negative.    PHYSICAL EXAM: VS:  BP (!) 150/77   Pulse (!) 43   Temp (!) 97.5 F (36.4 C)   Ht '5\' 10"'  (1.778 m)   Wt 203 lb 9.6 oz (92.4 kg)   SpO2 96%   BMI 29.21 kg/m  , BMI Body mass index is 29.21 kg/m. GENERAL: Slightly frail appearing NECK:  No jugular venous distention, waveform within normal limits, carotid upstroke brisk and symmetric, no bruits, no  thyromegaly LUNGS:  Clear to auscultation bilaterally CHEST:  Unremarkable HEART:  PMI not displaced or sustained,S1 and S2 within normal limits, no S3, no clicks, no rubs, no murmurs, irregular ABD:  Flat, positive bowel sounds normal in frequency in pitch, no bruits, no rebound, no guarding, no midline pulsatile mass, no hepatomegaly, no splenomegaly EXT:  2 plus pulses throughout, no edema, no cyanosis no clubbing   EKG:  EKG is  ordered today. Atrial fibrillation, rate 43, axis within normal limits, intervals within normal limits, no acute ST-T wave changes.  Recent Labs: 09/22/2019: Hemoglobin 13.0; Platelets 113.0 03/15/2020: ALT 27; BUN 22; Creatinine, Ser 1.02; Potassium 4.5; Sodium 137    Lipid Panel    Component Value Date/Time   CHOL 112 03/15/2020 1102   TRIG 70.0 03/15/2020 1102   HDL 51.80 03/15/2020 1102   CHOLHDL 2 03/15/2020 1102   VLDL 14.0 03/15/2020 1102   LDLCALC 46 03/15/2020 1102   LDLDIRECT 69.0 03/09/2018 1451      Wt Readings from Last 3 Encounters:  05/09/20 203 lb 9.6 oz (92.4 kg)  04/16/20 198 lb (89.8 kg)  03/19/20 198 lb (89.8 kg)      Other studies Reviewed: Additional studies/ records that were reviewed today include: None. Review of the above records demonstrates: NA  ASSESSMENT AND PLAN:  ATRIAL FLUTTER:   Gerald Martinez has a CHA2DS2 - VASc score of 5.   He has a very slow rate but he tolerates this.  He does not feel his palpitations.  He has no presyncope or syncope.  He tolerates anticoagulation.  He does not want to switch to another agent because of cost.  He had I have had a long discussion about this and he would let me know if he has any presyncope or syncope.  At this time there is no indication for further therapy.  CORONARY ATHEROSCLEROSIS NATIVE CORONARY ARTERY: He has no symptoms consistent with obstructive coronary disease.  No change in therapy.\  HYPERTENSION -   The blood pressure is at target.  No change in therapy.    COVID EDUCATION: He has been vaccinated.   Current medicines are reviewed at length with the patient today.  The patient does not have concerns regarding medicines.  The following changes have been made:  None  Labs/ tests ordered today include: None  Orders Placed This Encounter  Procedures  . EKG 12-Lead     Disposition:   FU with me in  in 12 months.     Signed, Minus Breeding, MD  05/09/2020 4:18 PM    [facility]

## 2020-05-09 ENCOUNTER — Other Ambulatory Visit: Payer: Self-pay

## 2020-05-09 ENCOUNTER — Encounter: Payer: Self-pay | Admitting: Cardiology

## 2020-05-09 ENCOUNTER — Ambulatory Visit (INDEPENDENT_AMBULATORY_CARE_PROVIDER_SITE_OTHER): Payer: Medicare HMO | Admitting: Cardiology

## 2020-05-09 VITALS — BP 150/77 | HR 43 | Temp 97.5°F | Ht 70.0 in | Wt 203.6 lb

## 2020-05-09 DIAGNOSIS — I2510 Atherosclerotic heart disease of native coronary artery without angina pectoris: Secondary | ICD-10-CM

## 2020-05-09 DIAGNOSIS — I10 Essential (primary) hypertension: Secondary | ICD-10-CM | POA: Diagnosis not present

## 2020-05-09 DIAGNOSIS — I483 Typical atrial flutter: Secondary | ICD-10-CM

## 2020-05-09 NOTE — Patient Instructions (Signed)
Medication Instructions:  No changes *If you need a refill on your cardiac medications before your next appointment, please call your pharmacy*  Lab Work: None ordered this visit  Testing/Procedures: None ordered this visit  Follow-Up: At [facility], you and your health needs are our priority.  As part of our continuing mission to provide you with exceptional heart care, we have created designated Provider Care Teams.  These Care Teams include your primary Cardiologist (physician) and Advanced Practice Providers (APPs -  Physician Assistants and Nurse Practitioners) who all work together to provide you with the care you need, when you need it.  Your next appointment:   12 month(s)  You will receive a reminder letter in the mail two months in advance. If you don't receive a letter, please call our office to schedule the follow-up appointment.  The format for your next appointment:   In Person  Provider:   Parag Hochrein, MD  

## 2020-05-11 ENCOUNTER — Other Ambulatory Visit: Payer: Self-pay | Admitting: Family Medicine

## 2020-05-13 DIAGNOSIS — H7291 Unspecified perforation of tympanic membrane, right ear: Secondary | ICD-10-CM | POA: Diagnosis not present

## 2020-05-13 DIAGNOSIS — H90A22 Sensorineural hearing loss, unilateral, left ear, with restricted hearing on the contralateral side: Secondary | ICD-10-CM | POA: Diagnosis not present

## 2020-05-13 DIAGNOSIS — H906 Mixed conductive and sensorineural hearing loss, bilateral: Secondary | ICD-10-CM | POA: Diagnosis not present

## 2020-05-13 DIAGNOSIS — H90A31 Mixed conductive and sensorineural hearing loss, unilateral, right ear with restricted hearing on the contralateral side: Secondary | ICD-10-CM | POA: Diagnosis not present

## 2020-05-20 DIAGNOSIS — Z7901 Long term (current) use of anticoagulants: Secondary | ICD-10-CM | POA: Diagnosis not present

## 2020-05-20 DIAGNOSIS — H7291 Unspecified perforation of tympanic membrane, right ear: Secondary | ICD-10-CM | POA: Diagnosis not present

## 2020-05-20 DIAGNOSIS — H9221 Otorrhagia, right ear: Secondary | ICD-10-CM | POA: Diagnosis not present

## 2020-05-22 ENCOUNTER — Other Ambulatory Visit: Payer: Self-pay | Admitting: Family Medicine

## 2020-05-22 NOTE — Telephone Encounter (Signed)
Pharmacy requests refill on: Simvastatin 40 mg   LAST REFILL: 05/05/2019 (Q-90, R-3) LAST OV: 04/16/2020 NEXT OV: 09/17/2020 PHARMACY: [facility] [place], [place]

## 2020-06-03 DIAGNOSIS — H7291 Unspecified perforation of tympanic membrane, right ear: Secondary | ICD-10-CM | POA: Diagnosis not present

## 2020-06-03 DIAGNOSIS — Z8669 Personal history of other diseases of the nervous system and sense organs: Secondary | ICD-10-CM | POA: Diagnosis not present

## 2020-06-17 ENCOUNTER — Other Ambulatory Visit: Payer: Self-pay | Admitting: Family Medicine

## 2020-07-01 ENCOUNTER — Telehealth: Payer: Self-pay | Admitting: Family Medicine

## 2020-07-01 NOTE — Progress Notes (Signed)
°  Chronic Care Management   Outreach Note  07/01/2020 Name: Gerald Martinez MRN: [id number] DOB: Oct 10, 1933  Referred by: Lesleigh Noe, MD Reason for referral : Chronic Care Management   An unsuccessful telephone outreach was attempted today. The patient was referred to the pharmacist for assistance with care management and care coordination.   Follow Up Plan:   Gerald Martinez  Upstream Scheduler

## 2020-07-09 ENCOUNTER — Other Ambulatory Visit: Payer: Self-pay

## 2020-07-09 ENCOUNTER — Ambulatory Visit (INDEPENDENT_AMBULATORY_CARE_PROVIDER_SITE_OTHER): Payer: Medicare HMO | Admitting: Podiatry

## 2020-07-09 ENCOUNTER — Encounter: Payer: Self-pay | Admitting: Podiatry

## 2020-07-09 DIAGNOSIS — E1140 Type 2 diabetes mellitus with diabetic neuropathy, unspecified: Secondary | ICD-10-CM | POA: Diagnosis not present

## 2020-07-09 DIAGNOSIS — M79676 Pain in unspecified toe(s): Secondary | ICD-10-CM

## 2020-07-09 DIAGNOSIS — B351 Tinea unguium: Secondary | ICD-10-CM

## 2020-07-09 NOTE — Progress Notes (Signed)
This patient returns to my office for at risk foot care.  This patient requires this care by a professional since this patient will be at risk due to having diabetes and coagulation defect..    This patient is unable to cut nails himself since the patient cannot reach his nails.These nails are painful walking and wearing shoes.  This patient presents for at risk foot care today.  General Appearance  Alert, conversant and in no acute stress.  Vascular  Dorsalis pedis  are palpable  bilaterally.  Posterior tibial pulses are absent  B/L. Capillary return is within normal limits  bilaterally. Cold feet Bilaterally. Venous stasis feet/legs  B/L.  Absent digital hair.   Venous congestion feet/toes  B/L.  Neurologic  Senn-Weinstein monofilament wire test within normal limits  bilaterally. Muscle power within normal limits bilaterally.  Nails Thick disfigured discolored nails with subungual debris  from hallux to fifth toes bilaterally. No evidence of bacterial infection or drainage bilaterally.  Orthopedic  No limitations of motion  feet .  No crepitus or effusions noted.  Hammer toes  B/l.  Skin  normotropic skin with no porokeratosis noted bilaterally.  No signs of infections or ulcers noted.     Onychomycosis  Pain in right toes  Pain in left toes  Consent was obtained for treatment procedures.   Mechanical debridement of nails 1-5  bilaterally performed with a nail nipper.  Filed with dremel without incident.    Return office visit    3 months                 Told patient to return for periodic foot care and evaluation due to potential at risk complications.   Gardiner Barefoot DPM

## 2020-07-12 ENCOUNTER — Telehealth: Payer: Self-pay | Admitting: Family Medicine

## 2020-07-12 NOTE — Progress Notes (Signed)
°  Chronic Care Management   Outreach Note  07/12/2020 Name: Gerald Martinez MRN: [id number] DOB: 1933/07/18  Referred by: Lesleigh Noe, MD Reason for referral : Chronic Care Management   A second unsuccessful telephone outreach was attempted today. The patient was referred to pharmacist for assistance with care management and care coordination.  Follow Up Plan:   Gerald Martinez  Upstream Scheduler

## 2020-07-25 ENCOUNTER — Telehealth: Payer: Self-pay | Admitting: Family Medicine

## 2020-07-25 NOTE — Telephone Encounter (Signed)
Pharmacy is calling about prior authorization for one of the prescription  Testerone gel

## 2020-07-30 NOTE — Telephone Encounter (Signed)
Patient left a voicemail stating that Medicare has denied his Testosterone. Patient stated that he needs someone to work on the appeal because he is almost out of the medication.

## 2020-07-30 NOTE — Telephone Encounter (Signed)
[place]: [id number] - PA Case ID: [id number] Need help? Call us at [phone] Status Sent to [employer] Testosterone 50 MG/5GM(1%) gel Form [employer] Electronic PA Form [phone] NCPDP)

## 2020-07-30 NOTE — Telephone Encounter (Signed)
Forwarding this to you in case you have time to work on it.

## 2020-08-01 NOTE — Telephone Encounter (Signed)
[place]: [id number] - PA Case ID: [id number] Need help? Call us at [phone] Outcome Denied on March 9 Your request has been denied Drug Testosterone 50 MG/5GM(1%) gel Form [employer] Electronic PA Form [id number] NCPDP)  Will complete appeal

## 2020-08-02 ENCOUNTER — Telehealth: Payer: Self-pay | Admitting: *Deleted

## 2020-08-02 NOTE — Telephone Encounter (Signed)
PA approved today through 05/24/2021 by [facility].

## 2020-08-02 NOTE — Telephone Encounter (Signed)
Received fax from [place] requesting PA forms be completed for Testosterone Gel.  Forms completed and faxed back to [phone].

## 2020-08-02 NOTE — Telephone Encounter (Signed)
PA approved through 05/24/2021.

## 2020-08-02 NOTE — Telephone Encounter (Signed)
Pt notified through mychart.

## 2020-09-02 ENCOUNTER — Telehealth: Payer: Self-pay | Admitting: Family Medicine

## 2020-09-02 NOTE — Telephone Encounter (Signed)
Pt already has wellness visits scheduled for later this month

## 2020-09-02 NOTE — Telephone Encounter (Signed)
Pt needs to be scheduled for a medicare wellness visit, please. 3 month refill sent in.

## 2020-09-11 ENCOUNTER — Other Ambulatory Visit: Payer: Self-pay

## 2020-09-11 ENCOUNTER — Ambulatory Visit: Payer: Medicare HMO

## 2020-09-11 ENCOUNTER — Telehealth: Payer: Self-pay

## 2020-09-11 NOTE — Telephone Encounter (Signed)
Called patient to complete his AWV. Patient stated that this was an absolute terrible time for him to try to complete this. He stated he didn't know I was going to call. Tried rescheduling but nothing worked for him. Patient wanted to complete in office with provider at his visit on 09/17/2020. Appointment cancelled.

## 2020-09-17 ENCOUNTER — Encounter: Payer: Self-pay | Admitting: Family Medicine

## 2020-09-17 ENCOUNTER — Ambulatory Visit (INDEPENDENT_AMBULATORY_CARE_PROVIDER_SITE_OTHER): Payer: Medicare HMO | Admitting: Family Medicine

## 2020-09-17 ENCOUNTER — Other Ambulatory Visit: Payer: Self-pay

## 2020-09-17 VITALS — BP 116/60 | HR 80 | Temp 98.1°F | Ht 70.0 in | Wt 206.2 lb

## 2020-09-17 DIAGNOSIS — E785 Hyperlipidemia, unspecified: Secondary | ICD-10-CM

## 2020-09-17 DIAGNOSIS — E1159 Type 2 diabetes mellitus with other circulatory complications: Secondary | ICD-10-CM | POA: Diagnosis not present

## 2020-09-17 DIAGNOSIS — E299 Testicular dysfunction, unspecified: Secondary | ICD-10-CM | POA: Diagnosis not present

## 2020-09-17 DIAGNOSIS — I10 Essential (primary) hypertension: Secondary | ICD-10-CM | POA: Diagnosis not present

## 2020-09-17 DIAGNOSIS — Z0000 Encounter for general adult medical examination without abnormal findings: Secondary | ICD-10-CM

## 2020-09-17 DIAGNOSIS — D509 Iron deficiency anemia, unspecified: Secondary | ICD-10-CM

## 2020-09-17 NOTE — Progress Notes (Signed)
Subjective:   Gerald Martinez is a 87 y.o. male who presents for Medicare Annual/Subsequent preventive examination.  Review of Systems    Review of Systems  Constitutional: Negative for chills and fever.  HENT: Negative for congestion and sore throat.   Eyes: Negative for blurred vision and double vision.  Respiratory: Negative for shortness of breath.   Cardiovascular: Negative for chest pain.  Gastrointestinal: Negative for heartburn, nausea and vomiting.  Genitourinary: Negative.   Musculoskeletal: Negative.  Negative for myalgias.  Skin: Negative for rash.  Neurological: Negative for dizziness and headaches.  Endo/Heme/Allergies: Does not bruise/bleed easily.  Psychiatric/Behavioral: Negative for depression. The patient is not nervous/anxious.     Cardiac Risk Factors include: advanced age (>17mn, >>81women);diabetes mellitus;hypertension;male gender     Objective:    Today's Vitals   09/17/20 1418  BP: 116/60  Pulse: 80  Temp: 98.1 F (36.7 C)  TempSrc: Temporal  SpO2: 96%  Weight: 206 lb 4 oz (93.6 kg)  Height: '5\' 10"'  (1.778 m)   Body mass index is 29.59 kg/m.  Advanced Directives 09/17/2020 03/15/2019 03/09/2018 03/09/2017 01/31/2016 02/27/2015 01/29/2015  Does Patient Have a Medical Advance Directive? Yes No No No No No No  Does patient want to make changes to medical advance directive? Yes (MAU/Ambulatory/Procedural Areas - Information given) - - - - - -  Would patient like information on creating a medical advance directive? - No - Patient declined - - - No - patient declined information -    Current Medications (verified) Outpatient Encounter Medications as of 09/17/2020  Medication Sig  . blood glucose meter kit and supplies One touch ultra 2-Check sugar once daily DX E11.59  . BOOSTRIX 5-2.5-18.5 LF-MCG/0.5 injection   . doxazosin (CARDURA) 4 MG tablet TAKE 1 TABLET BY MOUTH DAILY.  .Marland KitchenELIQUIS 5 MG TABS tablet TAKE 1 TABLET BY MOUTH 2 TIMES DAILY.  .Marland Kitchen gabapentin (NEURONTIN) 100 MG capsule Take 100 mg by mouth as needed.  .Marland Kitchenglucose blood (ONETOUCH ULTRA) test strip Check sugar once daily DX E11.59  . ketoconazole (NIZORAL) 2 % cream Apply topically 2 (two) times daily.  .Marland Kitchenlosartan (COZAAR) 50 MG tablet Take 2 tablets (100 mg total) by mouth daily.  . Melatonin 10 MG TABS Take 1 tablet by mouth as needed.  . metFORMIN (GLUCOPHAGE-XR) 500 MG 24 hr tablet TAKE 1 TABLET BY MOUTH IN THE MORNING AND 1 TABLET AT BEDTIME.  . Multiple Vitamin (MULTIVITAMIN) tablet Take 1 tablet by mouth daily.  . Multiple Vitamins-Minerals (CENTRUM ADULTS) TABS Take by mouth.  .Marland Kitchenomeprazole (PRILOSEC) 20 MG capsule TAKE 1 CAPSULE BY MOUTH DAILY.  .Glory RosebushDelica Lancets 384ZMISC Check sugar once daily DX E11.59  . simvastatin (ZOCOR) 40 MG tablet TAKE 1 TABLET BY MOUTH AT BEDTIME.  .Marland Kitchentestosterone (ANDROGEL) 50 MG/5GM (1%) GEL Place 10 g onto the skin daily.  . traZODone (DESYREL) 50 MG tablet Take 1 tablet (50 mg total) by mouth at bedtime as needed for sleep.   No facility-administered encounter medications on file as of 09/17/2020.    Allergies (verified) Propoxyphene n-acetaminophen   History: Past Medical History:  Diagnosis Date  . AAA (abdominal aortic aneurysm) ([place]   . Barrett's esophagus   . BPH (benign prostatic hypertrophy)   . CAD (coronary artery disease)    Post CABG in 2000.  LIMA to the LAD, SVG to OM, SVG to PDA, last perfusion study in 2012 with no high risk findings  . Colon  polyps   . Diabetes mellitus    Type II  . Dyslipidemia   . Esophageal cancer ([place])   . Hearing loss   . Hypertension    Past Surgical History:  Procedure Laterality Date  . CORONARY ARTERY BYPASS GRAFT  2000  . ESOPHAGECTOMY  2000  . [place] ARTHROPLASTY  1980  . VENTRAL HERNIA REPAIR  2001   Family History  Problem Relation Age of Onset  . Heart disease Mother   . Stroke Mother 20  . Lymphoma Father   . Cancer Sister        Uncertain  .  Dementia Brother   . Frontotemporal dementia Brother   . Memory loss Sister   . Cervical cancer Sister   . Lymphoma Sister   . Skin cancer Sister   . Prostate cancer Brother   . Cancer Brother        cancer of the jaw  . Colon cancer Neg Hx    Social History   Socioeconomic History  . Marital status: Married    Spouse name: Gerald Martinez  . Number of children: 2  . Years of education: 3 years of college  . Highest education level: Not on file  Occupational History  . Occupation: retired  Tobacco Use  . Smoking status: Former Smoker    Packs/day: 1.50    Years: 35.00    Pack years: 52.50    Quit date: 05/25/1997    Years since quitting: 23.3  . Smokeless tobacco: Never Used  Vaping Use  . Vaping Use: Never used  Substance and Sexual Activity  . Alcohol use: Yes    Alcohol/week: 6.0 standard drinks    Types: 6 Glasses of wine per week    Comment: small glass of wine each night  . Drug use: No  . Sexual activity: Not Currently  Other Topics Concern  . Not on file  Social History Narrative   Lives with wife Gaffer)   2 children (Gerald Martinez - [place]) and Gerald Martinez   Enjoys: genealogy research   Exercise: goes to the [employer] - 5 days a week most weeks   Social Determinants of Radio broadcast assistant Strain: Not on [employer] Insecurity: Not on file  Transportation Needs: Not on file  Physical Activity: Not on file  Stress: Not on file  Social Connections: Not on file    Tobacco Counseling Counseling given: Not Answered   Clinical Intake:  Pre-visit preparation completed: No  Pain : No/denies pain     BMI - recorded: 29 Nutritional Status: BMI 25 -29 Overweight Nutritional Risks: None Diabetes: Yes CBG done?: No Did pt. bring in CBG monitor from home?: No  How often do you need to have someone help you when you read instructions, pamphlets, or other written materials from your doctor or pharmacy?: 1 - Never What is the last grade level you completed in  school?: college  Diabetic? yes  Interpreter Needed?: No      Activities of Daily Living In your present state of health, do you have any difficulty performing the following activities: 09/17/2020  Hearing? N  Vision? N  Difficulty concentrating or making decisions? Y  Walking or climbing stairs? Y  Dressing or bathing? N  Doing errands, shopping? N  Preparing Food and eating ? N  Using the Toilet? N  In the past six months, have you accidently leaked urine? N  Do you have problems with loss of bowel control? N  Managing your Medications? N  Managing your Finances? N  Housekeeping or managing your Housekeeping? N  Some recent data might be hidden    Patient Care Team: Lesleigh Noe, MD as PCP - General (Family Medicine) Minus Breeding, MD (Cardiology) Ralene Bathe, MD (Ophthalmology) Gardiner Barefoot, DPM as Consulting Physician (Podiatry) [place], Camelia Eng, PA (Otolaryngology)  Indicate any recent Medical Services you may have received from other than [facility] providers in the past year (date may be approximate).     Assessment:   This is a routine wellness examination for [place].  Hearing/Vision screen  Hearing Screening   '125Hz'  '250Hz'  '500Hz'  '1000Hz'  '2000Hz'  '3000Hz'  '4000Hz'  '6000Hz'  '8000Hz'   Right ear:  '20 20 20 ' 0  0    Left ear:  '20 20 20 ' 0  0      Visual Acuity Screening   Right eye Left eye Both eyes  Without correction:   20/20  With correction: 20/20 20/30     Dietary issues and exercise activities discussed: Current Exercise Habits: Structured exercise class, Type of exercise: walking;treadmill;strength training/weights, Time (Minutes): 25, Frequency (Times/Week): 7 (gym 3-4 days a week), Weekly Exercise (Minutes/Week): 175, Intensity: Moderate, Exercise limited by: orthopedic condition(s);cardiac condition(s)  Goals    . Exercise 6-7x per week (30 min per time)    . Patient Stated     03/15/2019, I will exercise more daily and start back going to the [facility].        Depression Screen PHQ 2/9 Scores 09/17/2020 09/17/2020 03/19/2020 03/15/2019  PHQ - 2 Score 0 0 3 0  PHQ- 9 Score - - 7 0    Fall Risk Fall Risk  09/17/2020 03/19/2020 03/15/2019  Falls in the past year? '1 1 1  ' Number falls in past yr: 0 1 1  Injury with Fall? - 1 0  Risk for fall due to : - - History of fall(s);Impaired balance/gait;Medication side effect  Follow up - - Falls evaluation completed;Falls prevention discussed    FALL RISK PREVENTION PERTAINING TO THE HOME:  Any stairs in or around the home? Yes  If so, are there any without handrails? No  Home free of loose throw rugs in walkways, pet beds, electrical cords, etc? Yes  Adequate lighting in your home to reduce risk of falls? Yes   ASSISTIVE DEVICES UTILIZED TO PREVENT FALLS:  Life alert? No  Use of a cane, walker or w/c? Yes  Grab bars in the bathroom? Yes  Shower chair or bench in shower? No  Elevated toilet seat or a handicapped toilet? Yes   TIMED UP AND GO:  Was the test performed? Yes .  Length of time to ambulate 10 feet: 3 sec.   Gait slow and steady with assistive device  Cognitive Function: MMSE - Mini Mental State Exam 03/15/2019  Orientation to time 5  Orientation to Place 5  Registration 3  Attention/ Calculation 5  Recall 3  Language- repeat 1       Mini-Cog - 09/17/20 1440    Normal clock drawing test? yes    How many words correct? 3              Immunizations Immunization History  Administered Date(s) Administered  . H1N1 05/01/2008  . Influenza Split 03/12/2011  . Influenza Whole 02/21/2008, 02/20/2009, 02/21/2010  . Influenza, High Dose Seasonal PF 03/09/2017, 01/31/2019  . Influenza,inj,Quad PF,6+ Mos 02/27/2013, 01/25/2014, 01/29/2015, 01/31/2016  . Influenza-Unspecified 01/26/2018, 01/25/2020  . PFIZER(Purple Top)SARS-COV-2 Vaccination 06/07/2019, 06/27/2019, 03/23/2020  . Pneumococcal  Conjugate-13 01/25/2014  . Pneumococcal Polysaccharide-23 12/24/2003,  01/14/2009  . Td 01/14/2009  . Tdap 05/23/2019  . Zoster 01/24/2011    TDAP status: Up to date  Flu Vaccine status: Up to date  Pneumococcal vaccine status: Up to date  Covid-19 vaccine status: Completed vaccines  Qualifies for Shingles Vaccine? Yes   Zostavax completed Yes   Shingrix Completed?: No.    Education has been provided regarding the importance of this vaccine. Patient has been advised to call insurance company to determine out of pocket expense if they have not yet received this vaccine. Advised may also receive vaccine at local pharmacy or Health Dept. Verbalized acceptance and understanding.  Screening Tests Health Maintenance  Topic Date Due  . OPHTHALMOLOGY EXAM  01/24/2020  . HEMOGLOBIN A1C  07/04/2020  . COVID-19 Vaccine (4 - Booster for Pfizer series) 09/21/2020  . INFLUENZA VACCINE  12/23/2020  . FOOT EXAM  01/01/2021  . TETANUS/TDAP  05/22/2029  . PNA vac Low Risk Adult  Completed  . HPV VACCINES  Aged Out    Health Maintenance  Health Maintenance Due  Topic Date Due  . OPHTHALMOLOGY EXAM  01/24/2020  . HEMOGLOBIN A1C  07/04/2020  . COVID-19 Vaccine (4 - Booster for Pfizer series) 09/21/2020    Colorectal cancer screening: No longer required.   Lung Cancer Screening: (Low Dose CT Chest recommended if Age 51-80 years, 30 pack-year currently smoking OR have quit w/in 15years.) does not qualify.    Lung Cancer Screening Referral: n/a  Additional Screening:  Hepatitis C Screening: does qualify; declined  Vision Screening: Recommended annual ophthalmology exams for early detection of glaucoma and other disorders of the eye. Is the patient up to date with their annual eye exam?  Yes  Who is the provider or what is the name of the office in which the patient attends annual eye exams? [facility] If pt is not established with a provider, would they like to be referred to a provider to establish care? No .   Dental Screening: Recommended annual dental  exams for proper oral hygiene  Community Resource Referral / Chronic Care Management: CRR required this visit?  No   CCM required this visit?  No      Plan:     I have personally reviewed and noted the following in the patient's chart:   . Medical and social history . Use of alcohol, tobacco or illicit drugs  . Current medications and supplements . Functional ability and status . Nutritional status . Physical activity . Advanced directives . List of other physicians . Hospitalizations, surgeries, and ER visits in previous 12 months . Vitals . Screenings to include cognitive, depression, and falls . Referrals and appointments  In addition, I have reviewed and discussed with patient certain preventive protocols, quality metrics, and best practice recommendations. A written personalized care plan for preventive services as well as general preventive health recommendations were provided to patient.     Lesleigh Noe, MD   09/17/2020

## 2020-09-17 NOTE — Patient Instructions (Addendum)
Check with pharmacist about the Shingrix Vaccine - 2 part vaccine  Blood work today  Get your eye exam

## 2020-09-18 LAB — COMPREHENSIVE METABOLIC PANEL
ALT: 33 U/L (ref 0–53)
AST: 34 U/L (ref 0–37)
Albumin: 4.2 g/dL (ref 3.5–5.2)
Alkaline Phosphatase: 93 U/L (ref 39–117)
BUN: 19 mg/dL (ref 6–23)
CO2: 27 mEq/L (ref 19–32)
Calcium: 9.5 mg/dL (ref 8.4–10.5)
Chloride: 102 mEq/L (ref 96–112)
Creatinine, Ser: 1.08 mg/dL (ref 0.40–1.50)
GFR: 63.03 mL/min (ref >60.00)
Glucose, Bld: 107 mg/dL — ABNORMAL HIGH (ref 70–99)
Potassium: 5 mEq/L (ref 3.5–5.1)
Sodium: 138 mEq/L (ref 135–145)
Total Bilirubin: 0.7 mg/dL (ref 0.2–1.2)
Total Protein: 7.4 g/dL (ref 6.0–8.3)

## 2020-09-18 LAB — CBC
HCT: 38.8 % — ABNORMAL LOW (ref 39.0–52.0)
Hemoglobin: 12.8 g/dL — ABNORMAL LOW (ref 13.0–17.0)
MCHC: 32.9 g/dL (ref 30.0–36.0)
MCV: 84.2 fl (ref 78.0–100.0)
Platelets: 149 10*3/uL — ABNORMAL LOW (ref 150.0–400.0)
RBC: 4.62 Mil/uL (ref 4.22–5.81)
RDW: 17 % — ABNORMAL HIGH (ref 11.5–15.5)
WBC: 4.6 10*3/uL (ref 4.0–10.5)

## 2020-09-18 LAB — LIPID PANEL
Cholesterol: 106 mg/dL (ref 0–200)
HDL: 39.7 mg/dL (ref >39.00)
LDL Cholesterol: 52 mg/dL (ref 0–99)
NonHDL: 66.07
Total CHOL/HDL Ratio: 3
Triglycerides: 72 mg/dL (ref 0.0–149.0)
VLDL: 14.4 mg/dL (ref 0.0–40.0)

## 2020-09-18 LAB — HEMOGLOBIN A1C: Hgb A1c MFr Bld: 7 % — ABNORMAL HIGH (ref 4.6–6.5)

## 2020-09-18 LAB — TESTOSTERONE: Testosterone: 360.86 ng/dL (ref 300.00–890.00)

## 2020-10-07 ENCOUNTER — Other Ambulatory Visit: Payer: Self-pay | Admitting: Family Medicine

## 2020-10-11 DIAGNOSIS — H52203 Unspecified astigmatism, bilateral: Secondary | ICD-10-CM | POA: Diagnosis not present

## 2020-10-11 DIAGNOSIS — H524 Presbyopia: Secondary | ICD-10-CM | POA: Diagnosis not present

## 2020-10-11 DIAGNOSIS — H4720 Unspecified optic atrophy: Secondary | ICD-10-CM | POA: Diagnosis not present

## 2020-10-11 DIAGNOSIS — E119 Type 2 diabetes mellitus without complications: Secondary | ICD-10-CM | POA: Diagnosis not present

## 2020-10-11 LAB — HM DIABETES EYE EXAM

## 2020-10-15 ENCOUNTER — Other Ambulatory Visit: Payer: Self-pay | Admitting: Cardiology

## 2020-10-15 NOTE — Telephone Encounter (Signed)
51m, 93.6kg, scr 1.08 09/17/20, lovw/hochrein 05/09/20

## 2020-10-18 ENCOUNTER — Encounter: Payer: Self-pay | Admitting: Family Medicine

## 2020-10-29 ENCOUNTER — Other Ambulatory Visit: Payer: Self-pay

## 2020-10-29 ENCOUNTER — Encounter: Payer: Self-pay | Admitting: Podiatry

## 2020-10-29 ENCOUNTER — Other Ambulatory Visit: Payer: Self-pay | Admitting: Family Medicine

## 2020-10-29 ENCOUNTER — Ambulatory Visit: Payer: Medicare HMO | Admitting: Podiatry

## 2020-10-29 DIAGNOSIS — E299 Testicular dysfunction, unspecified: Secondary | ICD-10-CM

## 2020-10-29 DIAGNOSIS — B351 Tinea unguium: Secondary | ICD-10-CM

## 2020-10-29 DIAGNOSIS — M79676 Pain in unspecified toe(s): Secondary | ICD-10-CM

## 2020-10-29 DIAGNOSIS — E1140 Type 2 diabetes mellitus with diabetic neuropathy, unspecified: Secondary | ICD-10-CM

## 2020-10-29 NOTE — Progress Notes (Signed)
This patient returns to my office for at risk foot care.  This patient requires this care by a professional since this patient will be at risk due to having diabetes and coagulation defect..    This patient is unable to cut nails himself since the patient cannot reach his nails.These nails are painful walking and wearing shoes.  This patient presents for at risk foot care today.  General Appearance  Alert, conversant and in no acute stress.  Vascular  Dorsalis pedis  are palpable  bilaterally.  Posterior tibial pulses are absent  B/L. Capillary return is within normal limits  bilaterally. Cold feet Bilaterally. Venous stasis feet/legs  B/L.  Absent digital hair.   Venous congestion feet/toes  B/L.  Neurologic  Senn-Weinstein monofilament wire test within normal limits  bilaterally. Muscle power within normal limits bilaterally.  Nails Thick disfigured discolored nails with subungual debris  from hallux to fifth toes bilaterally. No evidence of bacterial infection or drainage bilaterally.  Orthopedic  No limitations of motion  feet .  No crepitus or effusions noted.  Hammer toes  B/l.  Skin  normotropic skin with no porokeratosis noted bilaterally.  No signs of infections or ulcers noted.     Onychomycosis  Pain in right toes  Pain in left toes  Consent was obtained for treatment procedures.   Mechanical debridement of nails 1-5  bilaterally performed with a nail nipper.  Filed with dremel without incident.    Return office visit    3 months                 Told patient to return for periodic foot care and evaluation due to potential at risk complications.   Gardiner Barefoot DPM

## 2020-10-30 NOTE — Telephone Encounter (Signed)
Last office visit 09/17/2020 for [place].  Last refilled 04/09/2020 for 900 g with 1 refill.  Last Testosterone level 09/17/2020 normal at 360.86ng/ml.  Next Appt: 12/23/2020 for follow up DM.

## 2020-11-27 ENCOUNTER — Other Ambulatory Visit: Payer: Self-pay | Admitting: Family Medicine

## 2020-11-29 ENCOUNTER — Telehealth: Payer: Self-pay | Admitting: *Deleted

## 2020-11-29 NOTE — Chronic Care Management (AMB) (Signed)
  Chronic Care Management   Outreach Note  11/29/2020 Name: Gerald Martinez MRN: [id number] DOB: [birth year]  Gerald Martinez is a 87 y.o. year old male who is a primary care patient of Einar Pheasant Jobe Marker, MD. I reached out to [place] by phone today in response to a referral sent by Gerald Martinez PCP Lesleigh Noe, MD     An unsuccessful telephone outreach was attempted today. The patient was referred to the case management team for assistance with care management and care coordination.   Follow Up Plan: A HIPAA compliant phone message was left for the patient providing contact information and requesting a return call.  If patient returns call to provider office, please advise to call Embedded Care Management Care Guide Anthonyjames Bargar at [place], [place] Management  Direct Dial: [phone]

## 2020-11-29 NOTE — Chronic Care Management (AMB) (Signed)
  Chronic Care Management   Note  11/29/2020 Name: Gerald Martinez MRN: [id number] DOB: 1933-07-14  [name] is a 87 y.o. year old male who is a primary care patient of Einar Pheasant, Jobe Marker, MD. I reached out to [place] by phone today in response to a referral sent by [name] PCP Lesleigh Noe, MD     [name] was given information about Chronic Care Management services today including:  CCM service includes personalized support from designated clinical staff supervised by his physician, including individualized plan of care and coordination with other care providers 24/7 contact phone numbers for assistance for urgent and routine care needs. Service will only be billed when office clinical staff spend 20 minutes or more in a month to coordinate care. Only one practitioner may furnish and bill the service in a calendar month. The patient may stop CCM services at any time (effective at the end of the month) by phone call to the office staff. The patient will be responsible for cost sharing (co-pay) of up to 20% of the service fee (after annual deductible is met).  Patient agreed to services and verbal consent obtained.   Follow up plan: Telephone appointment with care management team member scheduled for:12/26/2020  Julian Hy, [place] Management  Direct Dial: [phone]

## 2020-12-09 ENCOUNTER — Other Ambulatory Visit: Payer: Self-pay | Admitting: Family Medicine

## 2020-12-20 ENCOUNTER — Other Ambulatory Visit: Payer: Self-pay

## 2020-12-23 ENCOUNTER — Other Ambulatory Visit: Payer: Self-pay

## 2020-12-23 ENCOUNTER — Ambulatory Visit (INDEPENDENT_AMBULATORY_CARE_PROVIDER_SITE_OTHER): Payer: Medicare HMO | Admitting: Family Medicine

## 2020-12-23 ENCOUNTER — Encounter: Payer: Self-pay | Admitting: Family Medicine

## 2020-12-23 VITALS — BP 128/72 | HR 76 | Temp 97.9°F | Ht 70.0 in | Wt 211.0 lb

## 2020-12-23 DIAGNOSIS — R400 Somnolence: Secondary | ICD-10-CM | POA: Diagnosis not present

## 2020-12-23 DIAGNOSIS — E299 Testicular dysfunction, unspecified: Secondary | ICD-10-CM

## 2020-12-23 DIAGNOSIS — E1159 Type 2 diabetes mellitus with other circulatory complications: Secondary | ICD-10-CM | POA: Diagnosis not present

## 2020-12-23 LAB — POCT GLYCOSYLATED HEMOGLOBIN (HGB A1C): Hemoglobin A1C: 6.6 % — AB (ref 4.0–5.6)

## 2020-12-23 NOTE — Assessment & Plan Note (Signed)
Worse on days when his nighttime sleep is poor. Discussed continuing to monitor and if worsening to reach out for sleep apnea referral (does snore).

## 2020-12-23 NOTE — Assessment & Plan Note (Signed)
Stable on testosterone. He will continue 10 g dosing of gel.

## 2020-12-23 NOTE — Assessment & Plan Note (Signed)
Lab Results  Component Value Date   HGBA1C 6.6 (A) 12/23/2020   Improved from prior. Cont diabetic diet and metformin 500 mg.

## 2020-12-23 NOTE — Progress Notes (Signed)
Subjective:     Gerald Martinez is a 87 y.o. male presenting for Diabetes (3 mo/)     Diabetes   #Diabetes Currently taking metformin (Glucophage, [place])  Using medications without difficulties: No Hypoglycemic episodes:No  Hyperglycemic episodes:No  Feet problems:No  Blood Sugars averaging: 100-120s Last HgbA1c:  Lab Results  Component Value Date   HGBA1C 6.6 (A) 12/23/2020   Diet: doing well with diabetes Exercise: continues to exercise regularly  Diabetes Health Maintenance Due:    Diabetes Health Maintenance Due  Topic Date Due   FOOT EXAM  01/01/2021   HEMOGLOBIN A1C  06/25/2021   OPHTHALMOLOGY EXAM  10/11/2021   #Testosterone - not "jumping mountains"  - energy level is holding - satisfied with current dose - falling asleep too easily and running out of energy - sleeps well at night, occasionally waking up overnight - does use the bathroom since he is up - sometimes wakes up at 4 am and cannot go back to sleep - thinks he snores - no hx of getting work-up for sleep apnea - sleeping in a chair - back and leg pain  - daytime sleepiness worse after a night of poor sleep - worrying keeps him up   Review of Systems   Social History   Tobacco Use  Smoking Status Former   Packs/day: 1.50   Years: 35.00   Pack years: 52.50   Types: Cigarettes   Quit date: 05/25/1997   Years since quitting: 23.5  Smokeless Tobacco Never        Objective:    BP Readings from Last 3 Encounters:  12/23/20 128/72  09/17/20 116/60  05/09/20 (!) 150/77   Wt Readings from Last 3 Encounters:  12/23/20 211 lb (95.7 kg)  09/17/20 206 lb 4 oz (93.6 kg)  05/09/20 203 lb 9.6 oz (92.4 kg)    BP 128/72   Pulse 76   Temp 97.9 F (36.6 C) (Temporal)   Ht '5\' 10"'$  (1.778 m)   Wt 211 lb (95.7 kg)   SpO2 95%   BMI 30.28 kg/m    Physical Exam Constitutional:      Appearance: Normal appearance. He is not ill-appearing or diaphoretic.  HENT:     Right Ear: External ear  normal.     Left Ear: External ear normal.     Nose: Nose normal.  Eyes:     General: No scleral icterus.    Extraocular Movements: Extraocular movements intact.     Conjunctiva/sclera: Conjunctivae normal.  Cardiovascular:     Rate and Rhythm: Normal rate.  Pulmonary:     Effort: Pulmonary effort is normal.  Musculoskeletal:     Cervical back: Neck supple.  Skin:    General: Skin is warm and dry.  Neurological:     Mental Status: He is alert. Mental status is at baseline.  Psychiatric:        Mood and Affect: Mood normal.          Assessment & Plan:   Problem List Items Addressed This Visit       Endocrine   Diabetes ([place]) - Primary    Lab Results  Component Value Date   HGBA1C 6.6 (A) 12/23/2020  Improved from prior. Cont diabetic diet and metformin 500 mg.        Relevant Orders   POCT glycosylated hemoglobin (Hb A1C) (Completed)   Testicular dysfunction    Stable on testosterone. He will continue 10 g dosing of gel.  Other   Daytime sleepiness    Worse on days when his nighttime sleep is poor. Discussed continuing to monitor and if worsening to reach out for sleep apnea referral (does snore).          Return in about 8 months (around 08/23/2021) for for annual wellness visit - with health nurse first.  Lesleigh Noe, MD  This visit occurred during the SARS-CoV-2 public health emergency.  Safety protocols were in place, including screening questions prior to the visit, additional usage of staff PPE, and extensive cleaning of exam room while observing appropriate contact time as indicated for disinfecting solutions.

## 2020-12-23 NOTE — Patient Instructions (Addendum)
#  Diabetes - Keep up the good work! - continue the metformin  #Daytime sleepiness - if this gets worse let me know and can plan for a referral to a sleep specialist

## 2020-12-26 ENCOUNTER — Telehealth: Payer: Self-pay

## 2020-12-26 ENCOUNTER — Telehealth: Payer: Medicare HMO

## 2020-12-26 NOTE — Telephone Encounter (Signed)
  Care Management   Follow Up Note   12/26/2020 Name: Gerald Martinez MRN: [id number] DOB: 05/28/1933   Referred by: Lesleigh Noe, MD Reason for referral : Chronic Care Management (Initial assessment)   An unsuccessful telephone outreach was attempted today. The patient was referred to the case management team for assistance with care management and care coordination.  Initial telephone call to patient's listed home number. Phone sounded like fax. Attempted call to listed home number again. Unable to leave message due to phone ringing busy.  Attempted call to listed spouse mobile number.  Unable to reach or leave message due to voice mailbox being full.  12:48pm Received message from Donzetta Matters with [facility] stating patient was returning call.  12:53pm Attempted return call to both home and mobile number. Unable to reach. Mobile number voice mailbox full.   Follow Up Plan: The care management team will reach out to the patient again over the next 10 days.   Quinn Plowman RN,BSN,CCM RN Case Manager [place]  [phone]

## 2020-12-27 ENCOUNTER — Telehealth: Payer: Self-pay | Admitting: *Deleted

## 2020-12-27 NOTE — Chronic Care Management (AMB) (Signed)
  Care Management   Note  12/27/2020 Name: Gerald Martinez MRN: [id number] DOB: [birth year]  Gerald Martinez is a 87 y.o. year old male who is a primary care patient of Lesleigh Noe, MD and is actively engaged with the care management team. I reached out to [place] by phone today to assist with re-scheduling an initial visit with the RN Case Manager  Follow up plan: Telephone appointment with care management team member scheduled for: 01/14/2021  Julian Hy, [place], [place] Management  Direct Dial: [phone]

## 2020-12-27 NOTE — Chronic Care Management (AMB) (Signed)
  Care Management   Note  12/27/2020 Name: Gerald Martinez MRN: [id number] DOB: [birth year]  Gerald Martinez is a 87 y.o. year old male who is a primary care patient of Lesleigh Noe, MD and is actively engaged with the care management team. I reached out to [place] by phone today to assist with re-scheduling an initial visit with the RN Case Manager  Follow up plan: Unsuccessful telephone outreach attempt made. A HIPAA compliant phone message was left for the patient providing contact information and requesting a return call.   Julian Hy, [place] Management  Direct Dial: [phone]

## 2021-01-14 ENCOUNTER — Ambulatory Visit (INDEPENDENT_AMBULATORY_CARE_PROVIDER_SITE_OTHER): Payer: Medicare HMO

## 2021-01-14 DIAGNOSIS — E1159 Type 2 diabetes mellitus with other circulatory complications: Secondary | ICD-10-CM | POA: Diagnosis not present

## 2021-01-14 DIAGNOSIS — I10 Essential (primary) hypertension: Secondary | ICD-10-CM | POA: Diagnosis not present

## 2021-01-14 NOTE — Patient Instructions (Signed)
Visit Information:  Thank you for taking the time to speak with me today.   PATIENT GOALS:   Goals Addressed             This Visit's Progress    Monitor and Manage My Blood Sugar-Diabetes Type 2   On track    Timeframe:  Long-Range Goal Priority:  Medium Start Date:  01/14/2021                           Expected End Date:  04/23/2021                     Follow Up Date 02/18/2021    - Take your medications as prescribed - Follow up with your doctor as recommended - Notify your doctor for new or ongoing symptoms.  - check blood sugar at least 1-2 times per week and if blood sugar feels too high or low. (Report low blood sugars below 70 to your provider) - Follow:  How to treat low blood sugars (Blood sugar less than 70 mg/dl  Please follow the RULE OF 15 for the treatment of hypoglycemia treatment (When your blood sugars are less than 70 mg/ dl) STEP  1:  Take 15 grams of carbohydrates when your blood sugar is low, which includes:   3-4 glucose tabs or  3-4 oz of juice or regular soda or  One tube of glucose gel STEP 2:  Recheck blood sugar in 15 minutes STEP 3:  If your blood sugar is still low at the 15 minute recheck ---then, go back to STEP 1 and treat again with another 15 grams of carbohydrates the Rule of 15 for Low blood sugars:    Why is this important?   Checking your blood sugar at home helps to keep it from getting very high or very low.  Writing the results in a diary or log helps the doctor know how to care for you.  Your blood sugar log should have the time, date and the results.  Also, write down the amount of insulin or other medicine that you take.  Other information, like what you ate, exercise done and how you were feeling, will also be helpful.          COMPLETED: Patient Stated       03/15/2019, I will exercise more daily and start back going to the [facility].      Track and Manage My Blood Pressure-Hypertension   On track    Timeframe:  Long-Range  Goal Priority:  Medium Start Date:      01/14/2021                       Expected End Date:   04/23/2021                Follow Up Date 02/18/2021    - Follow a low salt/ heart healthy diet - Follow up with your providers as recommended - Take your medications as prescribed - Monitor blood pressure at least 1-2 times per month and record.  - Review information sent to you in MyChart on managing your blood pressure.    Why is this important?   You won't feel high blood pressure, but it can still hurt your blood vessels.  High blood pressure can cause heart or kidney problems. It can also cause a stroke.  Making lifestyle changes like losing a little weight or  eating less salt will help.  Checking your blood pressure at home and at different times of the day can help to control blood pressure.  If the doctor prescribes medicine remember to take it the way the doctor ordered.  Call the office if you cannot afford the medicine or if there are questions about it.           Consent to CCM Services: Mr. Casanova was given information about Chronic Care Management services including:  CCM service includes personalized support from designated clinical staff supervised by his physician, including individualized plan of care and coordination with other care providers 24/7 contact phone numbers for assistance for urgent and routine care needs. Service will only be billed when office clinical staff spend 20 minutes or more in a month to coordinate care. Only one practitioner may furnish and bill the service in a calendar month. The patient may stop CCM services at any time (effective at the end of the month) by phone call to the office staff. The patient will be responsible for cost sharing (co-pay) of up to 20% of the service fee (after annual deductible is met).  Patient agreed to services and verbal consent obtained.   Patient verbalizes understanding of instructions provided today and agrees to  view in [place].   The patient has been provided with contact information for the care management team and has been advised to call with any health related questions or concerns.  The care management team will reach out to the patient again over the next 45 days.   Quinn Plowman RN,BSN,CCM RN Case Manager [place]  [phone]   CLINICAL CARE PLAN: Patient Care Plan: Diabetes Type 2 (Adult)     Problem Identified: Glycemic Management (Diabetes, Type 2)   Priority: Medium     Long-Range Goal: Glycemic Management Optimized   Start Date: 01/14/2021  Expected End Date: 04/23/2021  This Visit's Progress: On track  Priority: Medium  Note:   Objective:  Lab Results  Component Value Date   HGBA1C 6.6 (A) 12/23/2020   Lab Results  Component Value Date   CREATININE 1.08 09/17/2020   CREATININE 1.02 03/15/2020   CREATININE 0.98 09/22/2019  Current Barriers:  Knowledge Deficits related to long term care plan for self management of  Diabetes:  Patient reports having diabetes for approximately 15 years. Patient confirms having a glucometer and states it has been a couple of weeks since he checked his blood sugar last.  Patient states he takes his medications as prescribed and states he is able to afford his medication at this time. Patient states he exercises at least 3-5 days per week doing weights and cardio. Per chart review next follow up with primary care provider is 02/27/2021. Case Manager Clinical Goal(s):  patient will demonstrate improved adherence to prescribed treatment plan for diabetes self care/management as evidenced by: Monitoring blood sugars weekly and recording of CBG,   adherence to ADA/ carb modified diet,  exercise at least 3-5 days/week , adherence to prescribed medication regimen, and contacting provider for new or worsened symptoms or questions Interventions:  Collaboration with Lesleigh Noe, MD regarding development and update of comprehensive plan of  care as evidenced by provider attestation and co-signature Inter-disciplinary care team collaboration (see longitudinal plan of care) Reviewed medications with patient and discussed importance of medication adherence Discussed plans with patient for ongoing care management follow up and provided patient with direct contact information for care management team Provided patient with written educational  materials related to hypo and hyperglycemia and importance of correct treatment Reviewed scheduled/upcoming provider appointments  Advised patient to monitor blood sugars weekly and record. Notifying provider for blood sugars <70 Patient Goals: - Take your medications as prescribed - Follow up with your doctor as recommended - Notify your doctor for new or ongoing symptoms.  - check blood sugar at least 1-2 times per week and if blood sugar feels too high or low. (Report low blood sugars below 70 to your provider) - Follow:  How to treat low blood sugars (Blood sugar less than 70 mg/dl  Please follow the RULE OF 15 for the treatment of hypoglycemia treatment (When your blood sugars are less than 70 mg/ dl) STEP  1:  Take 15 grams of carbohydrates when your blood sugar is low, which includes:   3-4 glucose tabs or  3-4 oz of juice or regular soda or  One tube of glucose gel STEP 2:  Recheck blood sugar in 15 minutes STEP 3:  If your blood sugar is still low at the 15 minute recheck ---then, go back to STEP 1 and treat again with another 15 grams of carbohydrates the Rule of 15 for Low blood sugars:    Follow Up Plan: The patient has been provided with contact information for the care management team and has been advised to call with any health related questions or concerns.  The care management team will reach out to the patient again over the next 45 days.      Patient Care Plan: Cardiovascular     Problem Identified: Hypertension (Hypertension)      Long-Range Goal: Hypertension  Monitored   Start Date: 01/14/2021  Expected End Date: 04/23/2021  This Visit's Progress: On track  Priority: Medium  Note:   Objective:  Last practice recorded BP readings:  BP Readings from Last 3 Encounters:  12/23/20 128/72  09/17/20 116/60  05/09/20 (!) 150/77  Current Barriers:  Knowledge Deficits related to basic understanding of hypertension pathophysiology and self care management  Case Manager Clinical Goal(s):  patient will attend all scheduled medical appointments patient will demonstrate adherence to prescribed treatment plan for hypertension as evidenced by taking all medications as prescribed, occasional monitoring and recording blood pressure,  adhering to low sodium/DASH diet patient will verbalize basic understanding of hypertension disease process and self health management plan Interventions:  Collaboration with Lesleigh Noe, MD regarding development and update of comprehensive plan of care as evidenced by provider attestation and co-signature Inter-disciplinary care team collaboration (see longitudinal plan of care) Evaluation of current treatment plan related to hypertension self management and patient's adherence to plan as established by provider. Provided education to patient re: stroke prevention, s/s of heart attack and stroke, DASH diet, complications of uncontrolled blood pressure Reviewed medications with patient and discussed importance of compliance Discussed plans with patient for ongoing care management follow up and provided patient with direct contact information for care management team Advised patient, providing education and rationale, to monitor blood pressure daily and record, calling PCP for findings outside established parameters.  Reviewed scheduled/upcoming provider appointments including:   Patient Goals: - Follow a low salt/ heart healthy diet - Follow up with your providers as recommended - Take your medications as prescribed -  Monitor blood pressure at least 1-2 times per month and record.  - Review information sent to you in MyChart on managing your blood pressure.   Follow Up Plan: The patient has been provided with contact information for  the care management team and has been advised to call with any health related questions or concerns.  The care management team will reach out to the patient again over the next 45 days.

## 2021-01-14 NOTE — Chronic Care Management (AMB) (Signed)
Chronic Care Management   CCM RN Visit Note  01/14/2021 Name: Gerald Martinez MRN: [id number] DOB: 1933/12/05  Subjective: Gerald Martinez is a 87 y.o. year old male who is a primary care patient of Einar Pheasant, Jobe Marker, MD. The care management team was consulted for assistance with disease management and care coordination needs.    Engaged with patient by telephone for initial visit in response to provider referral for case management and/or care coordination services.   Consent to Services:  The patient was given the following information about Chronic Care Management services today, agreed to services, and gave verbal consent: 1. CCM service includes personalized support from designated clinical staff supervised by the primary care provider, including individualized plan of care and coordination with other care providers 2. 24/7 contact phone numbers for assistance for urgent and routine care needs. 3. Service will only be billed when office clinical staff spend 20 minutes or more in a month to coordinate care. 4. Only one practitioner may furnish and bill the service in a calendar month. 5.The patient may stop CCM services at any time (effective at the end of the month) by phone call to the office staff. 6. The patient will be responsible for cost sharing (co-pay) of up to 20% of the service fee (after annual deductible is met). Patient agreed to services and consent obtained.  Patient agreed to services and verbal consent obtained.   Assessment: Review of patient past medical history, allergies, medications, health status, including review of consultants reports, laboratory and other test data, was performed as part of comprehensive evaluation and provision of chronic care management services.   SDOH (Social Determinants of Health) assessments and interventions performed:  SDOH Interventions    Flowsheet Row Most Recent Value  SDOH Interventions   Food Insecurity Interventions Intervention Not  Indicated  Housing Interventions Intervention Not Indicated  Transportation Interventions Intervention Not Indicated        CCM Care Plan  Allergies  Allergen Reactions   Propoxyphene N-Acetaminophen     Not sure if this is accurate per patient. Not sure    Outpatient Encounter Medications as of 01/14/2021  Medication Sig Note   doxazosin (CARDURA) 4 MG tablet TAKE 1 TABLET BY MOUTH DAILY.    ELIQUIS 5 MG TABS tablet TAKE 1 TABLET BY MOUTH 2 TIMES DAILY.    gabapentin (NEURONTIN) 100 MG capsule Take 100 mg by mouth as needed.    ketoconazole (NIZORAL) 2 % cream Apply topically 2 (two) times daily. 01/14/2021: Patient states he takes as needed.    losartan (COZAAR) 50 MG tablet TAKE 1 TABLET BY MOUTH DAILY.    Melatonin 10 MG TABS Take 1 tablet by mouth as needed.    metFORMIN (GLUCOPHAGE-XR) 500 MG 24 hr tablet Take 2 tablets in the morning and 1 tablet at bedtime    Multiple Vitamin (MULTIVITAMIN) tablet Take 1 tablet by mouth daily.    omeprazole (PRILOSEC) 20 MG capsule TAKE 1 CAPSULE BY MOUTH DAILY.    simvastatin (ZOCOR) 40 MG tablet TAKE 1 TABLET BY MOUTH AT BEDTIME.    testosterone (ANDROGEL) 50 MG/5GM (1%) GEL PLACE 10 GRAMS (2 PACKETS) ONTO THE SKIN DAILY    blood glucose meter kit and supplies One touch ultra 2-Check sugar once daily DX E11.59    BOOSTRIX 5-2.5-18.5 LF-MCG/0.5 injection     glucose blood (ONETOUCH ULTRA) test strip Check sugar once daily DX E11.59    Multiple Vitamins-Minerals (CENTRUM ADULTS) TABS Take by mouth.  OneTouch Delica Lancets 95M MISC Check sugar once daily DX E11.59    traZODone (DESYREL) 50 MG tablet Take 1 tablet (50 mg total) by mouth at bedtime as needed for sleep. 01/14/2021: Patient states he takes as needed.    No facility-administered encounter medications on file as of 01/14/2021.    Patient Active Problem List   Diagnosis Date Noted   Daytime sleepiness 12/23/2020   Weakness of left foot 01/02/2020   Educated about COVID-19  virus infection 11/13/2019   Typical atrial flutter ([place]) 03/28/2019   Dyslipidemia 03/27/2019   Insomnia 03/21/2019   AAA (abdominal aortic aneurysm) ([place]) 10/26/2018   Fatigue 09/07/2017   Numbness 09/07/2017   Bilateral impacted cerumen 06/15/2017   Mixed conductive and sensorineural hearing loss of both ears 06/15/2017   Tympanic membrane perforation, right 06/15/2017   Low back pain 06/01/2017   Balance problem 03/17/2017   History of esophageal cancer 08/06/2016   Pseudophakia of both eyes 01/22/2016   Vitreomacular traction syndrome of left eye 01/22/2016   Allergic rhinitis 01/25/2014   Hyponatremia 05/05/2013   GERD (gastroesophageal reflux disease) 09/01/2011   Encounter for long-term (current) use of other medications 01/19/2011   Overweight(278.02) 12/16/2010   BPH (benign prostatic hyperplasia) 09/15/2010   HYPERCHOLESTEROLEMIA 01/01/2010   CORONARY ATHEROSCLEROSIS NATIVE CORONARY ARTERY 05/30/2009   UNSPEC PERSONAL HX PRESENTING HAZARDS HEALTH 05/30/2009   RASH-NONVESICULAR 10/17/2008   Iron deficiency anemia 01/16/2008   OTHER DYSPHAGIA 08/23/2007   Testicular dysfunction 04/26/2007   Diabetes ([place]) 12/24/2006   Essential hypertension 12/24/2006    Conditions to be addressed/monitored:HTN and DMII  Care Plan : Diabetes Type 2 (Adult)  Updates made by Dannielle Karvonen, RN since 01/14/2021 12:00 AM     Problem: Glycemic Management (Diabetes, Type 2)   Priority: Medium     Long-Range Goal: Glycemic Management Optimized   Start Date: 01/14/2021  Expected End Date: 04/23/2021  This Visit's Progress: On track  Priority: Medium  Note:   Objective:  Lab Results  Component Value Date   HGBA1C 6.6 (A) 12/23/2020   Lab Results  Component Value Date   CREATININE 1.08 09/17/2020   CREATININE 1.02 03/15/2020   CREATININE 0.98 09/22/2019  Current Barriers:  Knowledge Deficits related to long term care plan for self management of  Diabetes:  Patient reports  having diabetes for approximately 15 years. Patient confirms having a glucometer and states it has been a couple of weeks since he checked his blood sugar last.  Patient states he takes his medications as prescribed and states he is able to afford his medication at this time. Patient states he exercises at least 3-5 days per week doing weights and cardio. Per chart review next follow up with primary care provider is 02/27/2021. Case Manager Clinical Goal(s):  patient will demonstrate improved adherence to prescribed treatment plan for diabetes self care/management as evidenced by: Monitoring blood sugars weekly and recording of CBG,   adherence to ADA/ carb modified diet,  exercise at least 3-5 days/week , adherence to prescribed medication regimen, and contacting provider for new or worsened symptoms or questions Interventions:  Collaboration with Lesleigh Noe, MD regarding development and update of comprehensive plan of care as evidenced by provider attestation and co-signature Inter-disciplinary care team collaboration (see longitudinal plan of care) Reviewed medications with patient and discussed importance of medication adherence Discussed plans with patient for ongoing care management follow up and provided patient with direct contact information for care management team Provided patient with written educational  materials related to hypo and hyperglycemia and importance of correct treatment Reviewed scheduled/upcoming provider appointments  Advised patient to monitor blood sugars weekly and record. Notifying provider for blood sugars <70 Patient Goals: - Take your medications as prescribed - Follow up with your doctor as recommended - Notify your doctor for new or ongoing symptoms.  - check blood sugar at least 1-2 times per week and if blood sugar feels too high or low. (Report low blood sugars below 70 to your provider) - Follow:  How to treat low blood sugars (Blood sugar less than 70  mg/dl  Please follow the RULE OF 15 for the treatment of hypoglycemia treatment (When your blood sugars are less than 70 mg/ dl) STEP  1:  Take 15 grams of carbohydrates when your blood sugar is low, which includes:   3-4 glucose tabs or  3-4 oz of juice or regular soda or  One tube of glucose gel STEP 2:  Recheck blood sugar in 15 minutes STEP 3:  If your blood sugar is still low at the 15 minute recheck ---then, go back to STEP 1 and treat again with another 15 grams of carbohydrates the Rule of 15 for Low blood sugars:    Follow Up Plan: The patient has been provided with contact information for the care management team and has been advised to call with any health related questions or concerns.  The care management team will reach out to the patient again over the next 45 days.      Care Plan : Cardiovascular  Updates made by Dannielle Karvonen, RN since 01/14/2021 12:00 AM     Problem: Hypertension (Hypertension)      Long-Range Goal: Hypertension Monitored   Start Date: 01/14/2021  Expected End Date: 04/23/2021  This Visit's Progress: On track  Priority: Medium  Note:   Objective:  Last practice recorded BP readings:  BP Readings from Last 3 Encounters:  12/23/20 128/72  09/17/20 116/60  05/09/20 (!) 150/77  Current Barriers:  Knowledge Deficits related to basic understanding of hypertension pathophysiology and self care management  Case Manager Clinical Goal(s):  patient will attend all scheduled medical appointments patient will demonstrate adherence to prescribed treatment plan for hypertension as evidenced by taking all medications as prescribed, occasional monitoring and recording blood pressure,  adhering to low sodium/DASH diet patient will verbalize basic understanding of hypertension disease process and self health management plan Interventions:  Collaboration with Lesleigh Noe, MD regarding development and update of comprehensive plan of care as evidenced by  provider attestation and co-signature Inter-disciplinary care team collaboration (see longitudinal plan of care) Evaluation of current treatment plan related to hypertension self management and patient's adherence to plan as established by provider. Provided education to patient re: stroke prevention, s/s of heart attack and stroke, DASH diet, complications of uncontrolled blood pressure Reviewed medications with patient and discussed importance of compliance Discussed plans with patient for ongoing care management follow up and provided patient with direct contact information for care management team Advised patient, providing education and rationale, to monitor blood pressure daily and record, calling PCP for findings outside established parameters.  Reviewed scheduled/upcoming provider appointments including:   Patient Goals: - Follow a low salt/ heart healthy diet - Follow up with your providers as recommended - Take your medications as prescribed - Monitor blood pressure at least 1-2 times per month and record.  - Review information sent to you in MyChart on managing your blood pressure.   Follow  Up Plan: The patient has been provided with contact information for the care management team and has been advised to call with any health related questions or concerns.  The care management team will reach out to the patient again over the next 45 days.        Plan:The patient has been provided with contact information for the care management team and has been advised to call with any health related questions or concerns.  and The care management team will reach out to the patient again over the next 45 days. Quinn Plowman RN,BSN,CCM RN Case Manager [place]  [phone]

## 2021-01-17 ENCOUNTER — Telehealth: Payer: Self-pay | Admitting: Cardiology

## 2021-01-17 NOTE — Telephone Encounter (Signed)
Left a message for the pt to call back.   Last OV 04/2020 Family med appt 02/18/21

## 2021-01-17 NOTE — Telephone Encounter (Signed)
Patient called and made mention that he experienced an electric shock on two separate occasions last night that woke him up out of his sleep.

## 2021-01-21 NOTE — Telephone Encounter (Signed)
Left message for pt to call.

## 2021-01-21 NOTE — Telephone Encounter (Signed)
Patient called again. He states he never got a call back after he called last week.  Patient does not want to wait until December to see Dr. Percival Spanish. He states "he may be dead by then". The patient has not had the shocking sensation again since it happened.

## 2021-01-21 NOTE — Telephone Encounter (Signed)
No answer on home number. Unable to reach pt or leave a message mailbox is full.

## 2021-01-21 NOTE — Telephone Encounter (Signed)
Patient is returning call.  °

## 2021-01-22 DIAGNOSIS — L819 Disorder of pigmentation, unspecified: Secondary | ICD-10-CM | POA: Diagnosis not present

## 2021-01-22 DIAGNOSIS — L814 Other melanin hyperpigmentation: Secondary | ICD-10-CM | POA: Diagnosis not present

## 2021-01-22 DIAGNOSIS — L570 Actinic keratosis: Secondary | ICD-10-CM | POA: Diagnosis not present

## 2021-01-22 DIAGNOSIS — D229 Melanocytic nevi, unspecified: Secondary | ICD-10-CM | POA: Diagnosis not present

## 2021-01-22 DIAGNOSIS — L821 Other seborrheic keratosis: Secondary | ICD-10-CM | POA: Diagnosis not present

## 2021-02-10 ENCOUNTER — Other Ambulatory Visit: Payer: Self-pay | Admitting: Family Medicine

## 2021-02-18 ENCOUNTER — Ambulatory Visit (INDEPENDENT_AMBULATORY_CARE_PROVIDER_SITE_OTHER): Payer: Medicare HMO

## 2021-02-18 DIAGNOSIS — E1159 Type 2 diabetes mellitus with other circulatory complications: Secondary | ICD-10-CM

## 2021-02-18 DIAGNOSIS — I10 Essential (primary) hypertension: Secondary | ICD-10-CM

## 2021-02-18 NOTE — Patient Instructions (Signed)
Visit Information:  Thank you for taking the time to speak with me today.   PATIENT GOALS:  Goals Addressed             This Visit's Progress    Monitor and Manage My Blood Sugar-Diabetes Type 2   On track       Timeframe:  Long-Range Goal Priority:  Medium Start Date:  01/14/2021                           Expected End Date:  05/23/2021                   Follow Up Date 05/13/2021   - Take your medications as prescribed   Continue to take your medications as prescribed and refill timely -  Continue to follow up with your doctor as recommended - Notify your doctor for new or ongoing symptoms.  - check blood sugar at least 1-2 times per week and if blood sugar feels too high or low. (Report low blood sugars below 70 to your provider) - Follow:  How to treat low blood sugars (Blood sugar less than 70 mg/dl  Please follow the RULE OF 15 for the treatment of hypoglycemia treatment (When your blood sugars are less than 70 mg/ dl) STEP  1:  Take 15 grams of carbohydrates when your blood sugar is low, which includes:   3-4 glucose tabs or  3-4 oz of juice or regular soda or  One tube of glucose gel STEP 2:  Recheck blood sugar in 15 minutes STEP 3:  If your blood sugar is still low at the 15 minute recheck ---then, go back to STEP 1 and treat again with another 15 grams of carbohydrates the Rule of 15 for Low blood sugars:    Why is this important?   Checking your blood sugar at home helps to keep it from getting very high or very low.  Writing the results in a diary or log helps the doctor know how to care for you.  Your blood sugar log should have the time, date and the results.  Also, write down the amount of insulin or other medicine that you take.  Other information, like what you ate, exercise done and how you were feeling, will also be helpful.          Track and Manage My Blood Pressure-Hypertension   On track    Timeframe:  Long-Range Goal Priority:  Medium Start Date:       01/14/2021                       Expected End Date:   05/23/2021                Follow Up Date 05/13/2021   - Continue to follow a low salt/ heart healthy diet - Follow up with your providers as recommended - Continue to take your medications as prescribed and refill timely - Continue to monitor blood pressure at least 1-2 times per month and record.    Why is this important?   You won't feel high blood pressure, but it can still hurt your blood vessels.  High blood pressure can cause heart or kidney problems. It can also cause a stroke.  Making lifestyle changes like losing a little weight or eating less salt will help.  Checking your blood pressure at home and at different times of the day  can help to control blood pressure.  If the doctor prescribes medicine remember to take it the way the doctor ordered.  Call the office if you cannot afford the medicine or if there are questions about it.           Patient verbalizes understanding of instructions provided today and agrees to view in Hand.   The patient has been provided with contact information for the care management team and has been advised to call with any health related questions or concerns.  The care management team will reach out to the patient again over the next 2-3 months.     Quinn Plowman RN,BSN,CCM RN Case Manager [place]  [phone]

## 2021-02-18 NOTE — Chronic Care Management (AMB) (Signed)
Chronic Care Management   CCM RN Visit Note  02/18/2021 Name: Gerald Martinez MRN: [id number] DOB: 04/04/1934  Subjective: Gerald Martinez is a 87 y.o. year old male who is a primary care patient of Einar Pheasant, Jobe Marker, MD. The care management team was consulted for assistance with disease management and care coordination needs.    Engaged with patient by telephone for follow up visit in response to provider referral for case management and/or care coordination services.   Consent to Services:  The patient was given information about Chronic Care Management services, agreed to services, and gave verbal consent prior to initiation of services.  Please see initial visit note for detailed documentation.   Patient agreed to services and verbal consent obtained.   Assessment: Review of patient past medical history, allergies, medications, health status, including review of consultants reports, laboratory and other test data, was performed as part of comprehensive evaluation and provision of chronic care management services.   SDOH (Social Determinants of Health) assessments and interventions performed:    CCM Care Plan  Allergies  Allergen Reactions   Propoxyphene N-Acetaminophen     Not sure if this is accurate per patient. Not sure    Outpatient Encounter Medications as of 02/18/2021  Medication Sig Note   Blood Glucose Monitoring Suppl (ONE TOUCH ULTRA 2) w/Device KIT CHECK SUGAR ONCE DAILY    BOOSTRIX 5-2.5-18.5 LF-MCG/0.5 injection     doxazosin (CARDURA) 4 MG tablet TAKE 1 TABLET BY MOUTH DAILY.    ELIQUIS 5 MG TABS tablet TAKE 1 TABLET BY MOUTH 2 TIMES DAILY.    gabapentin (NEURONTIN) 100 MG capsule Take 100 mg by mouth as needed.    glucose blood (ONETOUCH ULTRA) test strip Check sugar once daily DX E11.59    ketoconazole (NIZORAL) 2 % cream Apply topically 2 (two) times daily. 01/14/2021: Patient states he takes as needed.    losartan (COZAAR) 50 MG tablet TAKE 1 TABLET BY MOUTH DAILY.     Melatonin 10 MG TABS Take 1 tablet by mouth as needed.    metFORMIN (GLUCOPHAGE-XR) 500 MG 24 hr tablet Take 2 tablets in the morning and 1 tablet at bedtime    Multiple Vitamin (MULTIVITAMIN) tablet Take 1 tablet by mouth daily.    Multiple Vitamins-Minerals (CENTRUM ADULTS) TABS Take by mouth.    omeprazole (PRILOSEC) 20 MG capsule TAKE 1 CAPSULE BY MOUTH DAILY.    OneTouch Delica Lancets 38U MISC Check sugar once daily DX E11.59    simvastatin (ZOCOR) 40 MG tablet TAKE 1 TABLET BY MOUTH AT BEDTIME.    testosterone (ANDROGEL) 50 MG/5GM (1%) GEL PLACE 10 GRAMS (2 PACKETS) ONTO THE SKIN DAILY    traZODone (DESYREL) 50 MG tablet Take 1 tablet (50 mg total) by mouth at bedtime as needed for sleep. 01/14/2021: Patient states he takes as needed.    No facility-administered encounter medications on file as of 02/18/2021.    Patient Active Problem List   Diagnosis Date Noted   Daytime sleepiness 12/23/2020   Weakness of left foot 01/02/2020   Educated about COVID-19 virus infection 11/13/2019   Typical atrial flutter ([place]) 03/28/2019   Dyslipidemia 03/27/2019   Insomnia 03/21/2019   AAA (abdominal aortic aneurysm) ([place]) 10/26/2018   Fatigue 09/07/2017   Numbness 09/07/2017   Bilateral impacted cerumen 06/15/2017   Mixed conductive and sensorineural hearing loss of both ears 06/15/2017   Tympanic membrane perforation, right 06/15/2017   Low back pain 06/01/2017   Balance problem 03/17/2017  History of esophageal cancer 08/06/2016   Pseudophakia of both eyes 01/22/2016   Vitreomacular traction syndrome of left eye 01/22/2016   Allergic rhinitis 01/25/2014   Hyponatremia 05/05/2013   GERD (gastroesophageal reflux disease) 09/01/2011   Encounter for long-term (current) use of other medications 01/19/2011   Overweight(278.02) 12/16/2010   BPH (benign prostatic hyperplasia) 09/15/2010   HYPERCHOLESTEROLEMIA 01/01/2010   CORONARY ATHEROSCLEROSIS NATIVE CORONARY ARTERY 05/30/2009    UNSPEC PERSONAL HX PRESENTING HAZARDS HEALTH 05/30/2009   RASH-NONVESICULAR 10/17/2008   Iron deficiency anemia 01/16/2008   OTHER DYSPHAGIA 08/23/2007   Testicular dysfunction 04/26/2007   Diabetes ([place]) 12/24/2006   Essential hypertension 12/24/2006    Conditions to be addressed/monitored:HTN and DMII  Care Plan : Diabetes Type 2 (Adult)  Updates made by Dannielle Karvonen, RN since 02/18/2021 12:00 AM     Problem: Glycemic Management (Diabetes, Type 2)   Priority: Medium     Long-Range Goal: Glycemic Management Optimized   Start Date: 01/14/2021  Expected End Date: 05/23/2021  This Visit's Progress: On track  Recent Progress: On track  Priority: Medium  Note:   Objective:  Lab Results  Component Value Date   HGBA1C 6.6 (A) 12/23/2020   Lab Results  Component Value Date   CREATININE 1.08 09/17/2020   CREATININE 1.02 03/15/2020   CREATININE 0.98 09/22/2019  Current Barriers:  Knowledge Deficits related to long term care plan for self management of  Diabetes:  Patient reports he is doing well. He states he continues to check his blood sugars at least 3 days per week.   He reports blood sugars have ranged from 50 to 150 with low blood sugars occurring approximately 1 times per month.  Patient states he drinks a coke for low blood sugar treatment.  Patient states he continues to exercise on his treadmill at least 3-5 times per week.  He denies any medication changes. Patient states he is scheduled to follow up with his primary care provider on 02/27/2021 but due to the office relocating for renovations he will reschedule due to the distance.  Case Manager Clinical Goal(s):  patient will demonstrate improved adherence to prescribed treatment plan for diabetes self care/management as evidenced by: Monitoring blood sugars weekly and recording of CBG,   adherence to ADA/ carb modified diet,  exercise at least 3-5 days/week , adherence to prescribed medication regimen, and contacting  provider for new or worsened symptoms or questions Interventions:  Collaboration with Lesleigh Noe, MD regarding development and update of comprehensive plan of care as evidenced by provider attestation and co-signature Inter-disciplinary care team collaboration (see longitudinal plan of care) Reviewed medications with patient and discussed importance of medication adherence Discussed plans with patient for ongoing care management follow up and provided patient with direct contact information for care management team Provided patient with written educational materials related to hypo and hyperglycemia and importance of correct treatment Reviewed scheduled/upcoming provider appointments  Advised patient to monitor blood sugars weekly and record. Notifying provider for blood sugars <70 Patient Goals: - Continue to take your medications as prescribed and refill timely -  Continue to follow up with your doctor as recommended - Notify your doctor for new or ongoing symptoms.  - check blood sugar at least 1-2 times per week and if blood sugar feels too high or low. (Report low blood sugars below 70 to your provider) - Follow:  How to treat low blood sugars (Blood sugar less than 70 mg/dl  Please follow the RULE OF 15 for  the treatment of hypoglycemia treatment (When your blood sugars are less than 70 mg/ dl) STEP  1:  Take 15 grams of carbohydrates when your blood sugar is low, which includes:   3-4 glucose tabs or  3-4 oz of juice or regular soda or  One tube of glucose gel STEP 2:  Recheck blood sugar in 15 minutes STEP 3:  If your blood sugar is still low at the 15 minute recheck ---then, go back to STEP 1 and treat again with another 15 grams of carbohydrates the Rule of 15 for Low blood sugars:    Follow Up Plan: The patient has been provided with contact information for the care management team and has been advised to call with any health related questions or concerns.  The care management  team will reach out to the patient again over the next 2-3 months.      Care Plan : Cardiovascular  Updates made by Dannielle Karvonen, RN since 02/18/2021 12:00 AM     Problem: Hypertension (Hypertension)   Priority: Medium     Long-Range Goal: Hypertension Monitored   Start Date: 01/14/2021  Expected End Date: 05/23/2021  Recent Progress: On track  Priority: Medium  Note:   Objective:  Last practice recorded BP readings:  BP Readings from Last 3 Encounters:  12/23/20 128/72  09/17/20 116/60  05/09/20 (!) 150/77  Current Barriers:  Knowledge Deficits related to basic understanding of hypertension pathophysiology and self care management:  Patient states he is doing well. He reports blood pressure readings: 120/70, 130/65.   Case Manager Clinical Goal(s):  patient will attend all scheduled medical appointments patient will demonstrate adherence to prescribed treatment plan for hypertension as evidenced by taking all medications as prescribed, occasional monitoring and recording blood pressure,  adhering to low sodium/DASH diet patient will verbalize basic understanding of hypertension disease process and self health management plan Interventions:  Collaboration with Lesleigh Noe, MD regarding development and update of comprehensive plan of care as evidenced by provider attestation and co-signature Inter-disciplinary care team collaboration (see longitudinal plan of care) Evaluation of current treatment plan related to hypertension self management and patient's adherence to plan as established by provider. Provided education to patient re: stroke prevention, s/s of heart attack and stroke, DASH diet, complications of uncontrolled blood pressure Reviewed medications with patient and discussed importance of compliance Discussed plans with patient for ongoing care management follow up and provided patient with direct contact information for care management team Advised patient,  providing education and rationale, to monitor blood pressure daily and record, calling PCP for findings outside established parameters.  Reviewed scheduled/upcoming provider appointments including:   Patient Goals: - Continue to follow a low salt/ heart healthy diet - Follow up with your providers as recommended - Continue to take your medications as prescribed and refill timely - Continue to monitor blood pressure at least 1-2 times per month and record.   Follow Up Plan: The patient has been provided with contact information for the care management team and has been advised to call with any health related questions or concerns.  The care management team will reach out to the patient again over the next 2-3 months.         Plan:The patient has been provided with contact information for the care management team and has been advised to call with any health related questions or concerns.  and The care management team will reach out to the patient again over the next 2-3  months. Quinn Plowman RN,BSN,CCM RN Case Manager [place]  [phone]

## 2021-02-21 DIAGNOSIS — I10 Essential (primary) hypertension: Secondary | ICD-10-CM

## 2021-02-21 DIAGNOSIS — E1159 Type 2 diabetes mellitus with other circulatory complications: Secondary | ICD-10-CM | POA: Diagnosis not present

## 2021-02-26 ENCOUNTER — Ambulatory Visit (INDEPENDENT_AMBULATORY_CARE_PROVIDER_SITE_OTHER): Payer: Medicare HMO | Admitting: Podiatry

## 2021-02-26 ENCOUNTER — Encounter: Payer: Self-pay | Admitting: Podiatry

## 2021-02-26 ENCOUNTER — Other Ambulatory Visit: Payer: Self-pay

## 2021-02-26 DIAGNOSIS — B351 Tinea unguium: Secondary | ICD-10-CM | POA: Diagnosis not present

## 2021-02-26 DIAGNOSIS — E1140 Type 2 diabetes mellitus with diabetic neuropathy, unspecified: Secondary | ICD-10-CM

## 2021-02-26 DIAGNOSIS — M79676 Pain in unspecified toe(s): Secondary | ICD-10-CM | POA: Diagnosis not present

## 2021-02-26 NOTE — Progress Notes (Signed)
This patient returns to my office for at risk foot care.  This patient requires this care by a professional since this patient will be at risk due to having diabetes and coagulation defect..    This patient is unable to cut nails himself since the patient cannot reach his nails.These nails are painful walking and wearing shoes.  This patient presents for at risk foot care today.  General Appearance  Alert, conversant and in no acute stress.  Vascular  Dorsalis pedis  are palpable  bilaterally.  Posterior tibial pulses are absent  B/L. Capillary return is within normal limits  bilaterally. Cold feet Bilaterally. Venous stasis feet/legs  B/L.  Absent digital hair.   Venous congestion feet/toes  B/L.  Neurologic  Senn-Weinstein monofilament wire test within normal limits  bilaterally. Muscle power within normal limits bilaterally.  Nails Thick disfigured discolored nails with subungual debris  from hallux to fifth toes bilaterally. No evidence of bacterial infection or drainage bilaterally.  Orthopedic  No limitations of motion  feet .  No crepitus or effusions noted.  Hammer toes  B/l.  Skin  normotropic skin with no porokeratosis noted bilaterally.  No signs of infections or ulcers noted.     Onychomycosis  Pain in right toes  Pain in left toes  Consent was obtained for treatment procedures.   Mechanical debridement of nails 1-5  bilaterally performed with a nail nipper.  Filed with dremel without incident.    Return office visit    3 months                 Told patient to return for periodic foot care and evaluation due to potential at risk complications.   Gardiner Barefoot DPM

## 2021-02-27 ENCOUNTER — Ambulatory Visit: Payer: Medicare HMO | Admitting: Family Medicine

## 2021-03-05 DIAGNOSIS — R7989 Other specified abnormal findings of blood chemistry: Secondary | ICD-10-CM | POA: Diagnosis not present

## 2021-03-05 DIAGNOSIS — E6609 Other obesity due to excess calories: Secondary | ICD-10-CM | POA: Diagnosis not present

## 2021-03-05 DIAGNOSIS — I499 Cardiac arrhythmia, unspecified: Secondary | ICD-10-CM | POA: Diagnosis not present

## 2021-03-05 DIAGNOSIS — Z7984 Long term (current) use of oral hypoglycemic drugs: Secondary | ICD-10-CM | POA: Diagnosis not present

## 2021-03-05 DIAGNOSIS — E1159 Type 2 diabetes mellitus with other circulatory complications: Secondary | ICD-10-CM | POA: Diagnosis not present

## 2021-03-05 DIAGNOSIS — E7849 Other hyperlipidemia: Secondary | ICD-10-CM | POA: Diagnosis not present

## 2021-03-05 DIAGNOSIS — G63 Polyneuropathy in diseases classified elsewhere: Secondary | ICD-10-CM | POA: Diagnosis not present

## 2021-03-05 DIAGNOSIS — Z6830 Body mass index (BMI) 30.0-30.9, adult: Secondary | ICD-10-CM | POA: Diagnosis not present

## 2021-03-05 DIAGNOSIS — R12 Heartburn: Secondary | ICD-10-CM | POA: Diagnosis not present

## 2021-03-05 DIAGNOSIS — I10 Essential (primary) hypertension: Secondary | ICD-10-CM | POA: Diagnosis not present

## 2021-03-27 ENCOUNTER — Telehealth: Payer: Self-pay | Admitting: *Deleted

## 2021-03-27 NOTE — Chronic Care Management (AMB) (Signed)
  Care Management   Note  03/27/2021 Name: Gerald Martinez MRN: [id number] DOB: December 01, 1933  Gerald Martinez is a 87 y.o. year old male who is a primary care patient of Lesleigh Noe, MD and is actively engaged with the care management team. I reached out to [place] by phone today to assist with re-scheduling a follow up visit with the RN Case Manager  Follow up plan: Telephone appointment with care management team member scheduled for: 06/26/2021  Julian Hy, [place], [place] Management  Direct Dial: [phone]

## 2021-03-27 NOTE — Chronic Care Management (AMB) (Signed)
  Care Management   Note  03/27/2021 Name: Gerald Martinez MRN: [id number] DOB: 10-08-1933  [name] is a 87 y.o. year old male who is a primary care patient of Lesleigh Noe, MD and is actively engaged with the care management team. I reached out to [place] by phone today to assist with re-scheduling a follow up visit with the RN Case Manager  Follow up plan: Unsuccessful telephone outreach attempt made. A HIPAA compliant phone message was left for the patient providing contact information and requesting a return call.   Julian Hy, [place] Management  Direct Dial: [phone]

## 2021-04-14 ENCOUNTER — Other Ambulatory Visit: Payer: Self-pay | Admitting: Family Medicine

## 2021-04-14 ENCOUNTER — Other Ambulatory Visit: Payer: Self-pay | Admitting: Cardiology

## 2021-04-14 DIAGNOSIS — E299 Testicular dysfunction, unspecified: Secondary | ICD-10-CM

## 2021-04-14 NOTE — Telephone Encounter (Signed)
Amy, will you take a look and fill if warranted? Thanks.

## 2021-04-14 NOTE — Telephone Encounter (Signed)
Prescription refill request for Eliquis received. Indication:Aflutter Last office visit:12/21 Scr:1.0 Age: 87 Weight:95.7 kg  Prescription refilled

## 2021-04-15 NOTE — Telephone Encounter (Signed)
Hg and testosterone is nml range at last check  08/2020.  PDMP reviewed: no red flags.  Will refill as requested.

## 2021-04-28 ENCOUNTER — Other Ambulatory Visit: Payer: Self-pay | Admitting: Family Medicine

## 2021-05-05 ENCOUNTER — Ambulatory Visit: Payer: Medicare HMO | Admitting: Cardiology

## 2021-05-05 ENCOUNTER — Other Ambulatory Visit: Payer: Self-pay | Admitting: Family Medicine

## 2021-05-07 DIAGNOSIS — I4892 Unspecified atrial flutter: Secondary | ICD-10-CM | POA: Diagnosis not present

## 2021-05-07 DIAGNOSIS — I10 Essential (primary) hypertension: Secondary | ICD-10-CM | POA: Diagnosis not present

## 2021-05-07 DIAGNOSIS — Z0000 Encounter for general adult medical examination without abnormal findings: Secondary | ICD-10-CM | POA: Diagnosis not present

## 2021-05-07 DIAGNOSIS — E1159 Type 2 diabetes mellitus with other circulatory complications: Secondary | ICD-10-CM | POA: Diagnosis not present

## 2021-05-07 DIAGNOSIS — R7989 Other specified abnormal findings of blood chemistry: Secondary | ICD-10-CM | POA: Diagnosis not present

## 2021-05-07 DIAGNOSIS — D649 Anemia, unspecified: Secondary | ICD-10-CM | POA: Diagnosis not present

## 2021-05-07 DIAGNOSIS — E6609 Other obesity due to excess calories: Secondary | ICD-10-CM | POA: Diagnosis not present

## 2021-05-07 DIAGNOSIS — Z1211 Encounter for screening for malignant neoplasm of colon: Secondary | ICD-10-CM | POA: Diagnosis not present

## 2021-05-07 DIAGNOSIS — E7849 Other hyperlipidemia: Secondary | ICD-10-CM | POA: Diagnosis not present

## 2021-05-07 DIAGNOSIS — I4891 Unspecified atrial fibrillation: Secondary | ICD-10-CM | POA: Diagnosis not present

## 2021-05-08 NOTE — Progress Notes (Signed)
Cardiology Office Note   Date:  05/09/2021   ID:  Gerald Martinez, DOB Apr 19, 1934, MRN [id number]  PCP:  Kathalene Frames, MD  Cardiologist:   Minus Breeding, MD   Chief Complaint  Patient presents with   Atrial Flutter      History of Present Illness: Gerald Martinez is a 87 y.o. male who presents for follow up of atrial fibrillation and coronary disease.  Prior to the last visit his brother had died and his son had died suddenly from pancreatic cancer.  He had a lot of grief he was dealing with that now he is doing better.  He goes to the gym.  He actually walks on the treadmill.  He does balance exercises at home.  He says that he does get dizzy once in a while.  He will take meclizine and this goes away.  Eating better.  He is concentrating on his sleep and his activity.    He denies any palpitations, presyncope or syncope.  He has had no weight gain or edema.  He does get around slowly with his walker for balance   Past Medical History:  Diagnosis Date   AAA (abdominal aortic aneurysm)    Barrett's esophagus    BPH (benign prostatic hypertrophy)    CAD (coronary artery disease)    Post CABG in 2000.  LIMA to the LAD, SVG to OM, SVG to PDA, last perfusion study in 2012 with no high risk findings   Colon polyps    Diabetes mellitus    Type II   Dyslipidemia    Esophageal cancer (HCC)    Hearing loss    Hypertension     Past Surgical History:  Procedure Laterality Date   CORONARY ARTERY BYPASS GRAFT  2000   ESOPHAGECTOMY  [place]     Current Outpatient Medications  Medication Sig Dispense Refill   Blood Glucose Monitoring Suppl (ONE TOUCH ULTRA 2) w/Device KIT CHECK SUGAR ONCE DAILY 1 kit 0   BOOSTRIX 5-2.5-18.5 LF-MCG/0.5 injection      doxazosin (CARDURA) 4 MG tablet TAKE 1 TABLET BY MOUTH DAILY. 90 tablet 3   gabapentin (NEURONTIN) 100 MG capsule Take 100 mg by mouth as needed.     glucose blood  (ONETOUCH ULTRA) test strip Check sugar once daily DX E11.59 100 strip 2   ketoconazole (NIZORAL) 2 % cream Apply topically 2 (two) times daily.     losartan (COZAAR) 50 MG tablet TAKE 1 TABLET BY MOUTH DAILY. 90 tablet 1   Melatonin 10 MG TABS Take 1 tablet by mouth as needed.     metFORMIN (GLUCOPHAGE-XR) 500 MG 24 hr tablet Take 2 tablets in the morning and 1 tablet at bedtime 270 tablet 3   Multiple Vitamin (MULTIVITAMIN) tablet Take 1 tablet by mouth daily.     Multiple Vitamins-Minerals (CENTRUM ADULTS) TABS Take by mouth.     omeprazole (PRILOSEC) 20 MG capsule TAKE 1 CAPSULE BY MOUTH DAILY. 90 capsule 1   OneTouch Delica Lancets 71H MISC Check sugar once daily DX E11.59 100 each 2   simvastatin (ZOCOR) 40 MG tablet TAKE 1 TABLET BY MOUTH AT BEDTIME. 90 tablet 3   testosterone (ANDROGEL) 50 MG/5GM (1%) GEL PLACE 10 GRAMS (2 PACKETS) ONTO THE SKIN DAILY 900 g 1   traZODone (DESYREL) 50 MG tablet Take 1 tablet (50 mg total) by mouth at bedtime as needed for  sleep. 30 tablet 3   apixaban (ELIQUIS) 5 MG TABS tablet Take 1 tablet (5 mg total) by mouth 2 (two) times daily. 56 tablet 0   No current facility-administered medications for this visit.    Allergies:   Propoxyphene n-acetaminophen    ROS:  Please see the history of present illness.   Otherwise, review of systems are positive for none.   All other systems are reviewed and negative.    PHYSICAL EXAM: VS:  BP (!) 118/58 (BP Location: Left Arm, Patient Position: Sitting, Cuff Size: Normal)    Pulse 63    Ht _0  (1.778 m)    Wt 210 lb (95.3 kg)    BMI 30.13 kg/m  , BMI Body mass index is 30.13 kg/m. GENERAL:  Well appearing NECK:  No jugular venous distention, waveform within normal limits, carotid upstroke brisk and symmetric, no bruits, no thyromegaly LUNGS:  Clear to auscultation bilaterally CHEST:  Unremarkable HEART:  PMI not displaced or sustained,S1 and S2 within normal limits, no S3,  no clicks, no rubs, no murmurs,  irregular ABD:  Flat, positive bowel sounds normal in frequency in pitch, no bruits, no rebound, no guarding, no midline pulsatile mass, no hepatomegaly, no splenomegaly EXT:  2 plus pulses throughout, no edema, no cyanosis no clubbing   EKG:  EKG is  ordered today. Atrial flutter with variable conduction, axis within normal limits, intervals within normal limits, no acute ST-T wave changes.  Recent Labs: 09/17/2020: ALT 33; BUN 19; Creatinine, Ser 1.08; Hemoglobin 12.8; Platelets 149.0; Potassium 5.0; Sodium 138    Lipid Panel    Component Value Date/Time   CHOL 106 09/17/2020 1458   TRIG 72.0 09/17/2020 1458   HDL 39.70 09/17/2020 1458   CHOLHDL 3 09/17/2020 1458   VLDL 14.4 09/17/2020 1458   LDLCALC 52 09/17/2020 1458   LDLDIRECT 69.0 03/09/2018 1451      Wt Readings from Last 3 Encounters:  05/09/21 210 lb (95.3 kg)  12/23/20 211 lb (95.7 kg)  09/17/20 206 lb 4 oz (93.6 kg)      Other studies Reviewed: Additional studies/ records that were reviewed today include: Labs. Review of the above records demonstrates: See elsewhere  ASSESSMENT AND PLAN:  ATRIAL FLUTTER:   Mr. Gerald Martinez has a CHA2DS2 - VASc score of 5.  He tolerates this rate and anticoagulation.  No change in therapy.  He is careful and not falling.  He is up-to-date with blood work and on the appropriate dose.  CORONARY ATHEROSCLEROSIS NATIVE CORONARY ARTERY: He has no symptoms.  No change in therapy.   HYPERTENSION -   The blood pressure is well controlled.  No change in therapy.    Current medicines are reviewed at length with the patient today.  The patient does not have concerns regarding medicines.  The following changes have been made:  None  Labs/ tests ordered today include: None  Orders Placed This Encounter  Procedures   EKG 12-Lead      Disposition:   FU with me in in 6 months.     Signed, Minus Breeding, MD  05/09/2021 3:06 PM    [facility]

## 2021-05-09 ENCOUNTER — Ambulatory Visit: Payer: Medicare HMO | Admitting: Cardiology

## 2021-05-09 ENCOUNTER — Other Ambulatory Visit: Payer: Self-pay

## 2021-05-09 ENCOUNTER — Other Ambulatory Visit: Payer: Self-pay | Admitting: *Deleted

## 2021-05-09 ENCOUNTER — Encounter: Payer: Self-pay | Admitting: Cardiology

## 2021-05-09 VITALS — BP 118/58 | HR 63 | Ht 70.0 in | Wt 210.0 lb

## 2021-05-09 DIAGNOSIS — I4892 Unspecified atrial flutter: Secondary | ICD-10-CM

## 2021-05-09 DIAGNOSIS — I10 Essential (primary) hypertension: Secondary | ICD-10-CM | POA: Diagnosis not present

## 2021-05-09 DIAGNOSIS — I2510 Atherosclerotic heart disease of native coronary artery without angina pectoris: Secondary | ICD-10-CM | POA: Diagnosis not present

## 2021-05-09 MED ORDER — APIXABAN 5 MG PO TABS: 5.0000 mg | ORAL_TABLET | Freq: Two times a day (BID) | ORAL | 0 refills | Status: DC

## 2021-05-09 NOTE — Patient Instructions (Signed)
Medication Instructions:  Your Physician recommend you continue on your current medication as directed.    *If you need a refill on your cardiac medications before your next appointment, please call your pharmacy*   Follow-Up: At [facility], you and your health needs are our priority.  As part of our continuing mission to provide you with exceptional heart care, we have created designated Provider Care Teams.  These Care Teams include your primary Cardiologist (physician) and Advanced Practice Providers (APPs -  Physician Assistants and Nurse Practitioners) who all work together to provide you with the care you need, when you need it.  We recommend signing up for the patient portal called "MyChart".  Sign up information is provided on this After Visit Summary.  MyChart is used to connect with patients for Virtual Visits (Telemedicine).  Patients are able to view lab/test results, encounter notes, upcoming appointments, etc.  Non-urgent messages can be sent to your provider as well.   To learn more about what you can do with MyChart, go to [email or web address].    Your next appointment:   6 month(s)  The format for your next appointment:   In Person  Provider:   Minus Breeding, MD

## 2021-05-13 ENCOUNTER — Telehealth: Payer: Medicare HMO

## 2021-05-13 DIAGNOSIS — R7989 Other specified abnormal findings of blood chemistry: Secondary | ICD-10-CM | POA: Diagnosis not present

## 2021-05-27 DIAGNOSIS — Z1211 Encounter for screening for malignant neoplasm of colon: Secondary | ICD-10-CM | POA: Diagnosis not present

## 2021-05-30 DIAGNOSIS — N401 Enlarged prostate with lower urinary tract symptoms: Secondary | ICD-10-CM | POA: Diagnosis not present

## 2021-05-30 DIAGNOSIS — R350 Frequency of micturition: Secondary | ICD-10-CM | POA: Diagnosis not present

## 2021-06-02 ENCOUNTER — Encounter: Payer: Self-pay | Admitting: Internal Medicine

## 2021-06-02 ENCOUNTER — Ambulatory Visit: Payer: Medicare HMO | Admitting: Internal Medicine

## 2021-06-02 VITALS — BP 132/72 | HR 75 | Ht 70.0 in | Wt 206.0 lb

## 2021-06-02 DIAGNOSIS — Z7901 Long term (current) use of anticoagulants: Secondary | ICD-10-CM

## 2021-06-02 DIAGNOSIS — K219 Gastro-esophageal reflux disease without esophagitis: Secondary | ICD-10-CM

## 2021-06-02 DIAGNOSIS — R195 Other fecal abnormalities: Secondary | ICD-10-CM

## 2021-06-02 DIAGNOSIS — D509 Iron deficiency anemia, unspecified: Secondary | ICD-10-CM

## 2021-06-02 DIAGNOSIS — Z8501 Personal history of malignant neoplasm of esophagus: Secondary | ICD-10-CM | POA: Diagnosis not present

## 2021-06-02 NOTE — Progress Notes (Signed)
HISTORY OF PRESENT ILLNESS:  Gerald Martinez is a 88 y.o. male with past medical history as listed below and a history of short segment Barrett's with focal adenocarcinoma status post subtotal esophagectomy 22 years ago.  He also has a history of adenomatous colon polyps and has aged out of surveillance.  He is sent today by his primary care provider regarding iron deficiency anemia and Hemoccult positive stool.  Review of outside blood work reveals mild anemia with hemoglobin of 11.9.  Microcytosis with MCV 78.4.  Ferritin level 14.1.  Iron saturation 9%.  Stools positive for occult blood.  His last upper endoscopy was April 2009.  He was found to have esophageal stricture and esophagitis.  He is maintained on omeprazole 20 mg daily for GERD.  He denies dysphagia.  His last colonoscopy was performed August 2014.  He was found to have severe diverticulosis.  Otherwise normal.  Patient's GI review of systems is entirely negative.  He is on chronic Eliquis therapy for history of atrial arrhythmia.  His cardiologist is Dr. Percival Spanish.  REVIEW OF SYSTEMS:  All non-GI ROS negative unless otherwise stated in the HPI except for back pain, urinary leakage, hard of hearing  Past Medical History:  Diagnosis Date   AAA (abdominal aortic aneurysm)    Atrial flutter (HCC)    Barrett's esophagus    BPH (benign prostatic hypertrophy)    CAD (coronary artery disease)    Post CABG in 2000.  LIMA to the LAD, SVG to OM, SVG to PDA, last perfusion study in 2012 with no high risk findings   Chronic kidney disease    Colon polyps    Diabetes mellitus    Type II   Dyslipidemia    Esophageal cancer (HCC)    GERD (gastroesophageal reflux disease)    Hearing loss    Hypertension    Iron deficiency anemia    Obesity     Past Surgical History:  Procedure Laterality Date   CORONARY ARTERY BYPASS GRAFT  2000   ESOPHAGECTOMY  2000   LUMBAR DISC ARTHROPLASTY  1980   VENTRAL HERNIA REPAIR  2001    Social  History Gerald Martinez  reports that he quit smoking about 24 years ago. His smoking use included cigarettes. He has a 52.50 pack-year smoking history. He has never used smokeless tobacco. He reports current alcohol use of about 6.0 standard drinks per week. He reports that he does not use drugs.  family history includes Cancer in his brother and sister; Cervical cancer in his sister; Dementia in his brother; Frontotemporal dementia in his brother; Heart disease in his mother; Lymphoma in his father and sister; Memory loss in his sister; Pancreatic cancer (age of onset: 32) in his son; Prostate cancer in his brother; Skin cancer in his sister; Stroke (age of onset: 4) in his mother.  Allergies  Allergen Reactions   Propoxyphene N-Acetaminophen     Not sure if this is accurate per patient. Not sure       PHYSICAL EXAMINATION: Vital signs: BP 132/72    Pulse 75    Ht 5\' 10"  (1.778 m)    Wt 206 lb (93.4 kg)    SpO2 97%    BMI 29.56 kg/m   Constitutional: generally well-appearing, no acute distress Psychiatric: alert and oriented x3, cooperative Eyes: extraocular movements intact, anicteric, conjunctiva pink Mouth: oral pharynx moist, no lesions Neck: supple no lymphadenopathy Cardiovascular: Irregular, no murmur Lungs: clear to auscultation bilaterally Abdomen: soft, nontender, nondistended,  no obvious ascites, no peritoneal signs, normal bowel sounds, no organomegaly Rectal: Deferred till colonoscopy Extremities: no clubbing or cyanosis.  Lower extremity edema bilaterally, right greater than left Skin: no lesions on visible extremities Neuro: No focal deficits..  Nerves intact  ASSESSMENT:  1.  Iron deficiency anemia and Hemoccult positive stool in a gentleman on chronic Eliquis therapy 2.  History of adenocarcinoma of the esophagus arising out of Barrett's status post esophagectomy 22 years ago 3.  GERD.  On PPI 4.  History of adenomatous colon polyps.  Aged out of surveillance.   Last examination 2014 5.  Multiple medical problems.  On chronic anticoagulation therapy   PLAN:  1.  Colonoscopy to evaluate iron deficiency anemia and Hemoccult positive stool in the patient with adenomatous colon polyps.  The patient is HIGH RISK given his age, comorbidities, and the need to address his chronic anticoagulation.The nature of the procedure, as well as the risks, benefits, and alternatives were carefully and thoroughly reviewed with the patient. Ample time for discussion and questions allowed. The patient understood, was satisfied, and agreed to proceed.  2.  Upper endoscopy to evaluate iron deficiency anemia and Hemoccult positive stool in a patient with a prior history of esophageal cancer and GERD.  Patient is high risk as above.The nature of the procedure, as well as the risks, benefits, and alternatives were carefully and thoroughly reviewed with the patient. Ample time for discussion and questions allowed. The patient understood, was satisfied, and agreed to proceed.  3.  Hold Eliquis 2 days prior to the procedure.  We will check with Dr. Percival Spanish to see if this is acceptable.  We would anticipate immediate resumption of anticoagulation therapy pending the outcome of the examinations. 4.  Continue PPI

## 2021-06-02 NOTE — Patient Instructions (Signed)
If you are age 86 or older, your body mass index should be between 23-30. Your Body mass index is 29.56 kg/m. If this is out of the aforementioned range listed, please consider follow up with your Primary Care Provider.  If you are age 56 or younger, your body mass index should be between 19-25. Your Body mass index is 29.56 kg/m. If this is out of the aformentioned range listed, please consider follow up with your Primary Care Provider.   ________________________________________________________  The [place] [facility] providers would like to encourage you to use [facility] to communicate with providers for non-urgent requests or questions.  Due to long hold times on the telephone, sending your provider a message by [facility] may be a faster and more efficient way to get a response.  Please allow 48 business hours for a response.  Please remember that this is for non-urgent requests.  _______________________________________________________  I will call you to schedule your endoscopy/colonoscopy

## 2021-06-04 ENCOUNTER — Encounter: Payer: Self-pay | Admitting: Internal Medicine

## 2021-06-14 ENCOUNTER — Other Ambulatory Visit: Payer: Self-pay | Admitting: Family Medicine

## 2021-06-16 ENCOUNTER — Other Ambulatory Visit: Payer: Self-pay | Admitting: Family Medicine

## 2021-06-20 ENCOUNTER — Telehealth: Payer: Self-pay | Admitting: *Deleted

## 2021-06-20 DIAGNOSIS — R7989 Other specified abnormal findings of blood chemistry: Secondary | ICD-10-CM | POA: Diagnosis not present

## 2021-06-20 NOTE — Chronic Care Management (AMB) (Signed)
°  Care Management   Note  06/20/2021 Name: Gerald Martinez MRN: [id number] DOB: [birth year]  [name] is a 88 y.o. year old male who is a primary care patient of Kathalene Frames, MD and is actively engaged with the care management team. I reached out to [place] by phone today to assist with re-scheduling a follow up visit with the RN Case Manager  Follow up plan: Unsuccessful telephone outreach attempt made. A HIPAA compliant phone message was left for the patient providing contact information and requesting a return call.   Julian Hy, [employer]  Direct Dial: [phone]

## 2021-06-26 ENCOUNTER — Telehealth: Payer: Medicare HMO

## 2021-06-27 ENCOUNTER — Telehealth: Payer: Self-pay

## 2021-06-27 ENCOUNTER — Telehealth: Payer: Self-pay | Admitting: *Deleted

## 2021-06-27 NOTE — Telephone Encounter (Signed)
Magda Paganini,  Pt needs Eliquis hold per the OV note with Dr Henrene Pastor 06-02-2021.  PV is 2-7- please obtain hold for PV 07-01-21,  Thanks  Lelan Pons PV

## 2021-06-27 NOTE — Telephone Encounter (Signed)
Sent anti coag form to pool.  Will call patient when I get an answer.

## 2021-06-27 NOTE — Telephone Encounter (Signed)
[facility] Pre-operative Risk Assessment     Request for surgical clearance:     Endoscopy Procedure  What type of surgery is being performed?     Endo/Colon  When is this surgery scheduled?     08/21/2021  What type of clearance is required ?   Pharmacy  Are there any medications that need to be held prior to surgery and how long? Xarelto - 2 days  Practice name and name of physician performing surgery?      [facility]  What is your office phone and fax number?      Phone- [phone]  [phone]  Anesthesia type (None, local, MAC, general) ?       MAC

## 2021-06-30 NOTE — Telephone Encounter (Signed)
° ° °  Patient Name: Gerald Martinez  DOB: 1934/01/03 MRN: [id number]  Primary Cardiologist: Minus Breeding, MD  Chart reviewed as part of pre-operative protocol coverage. Patient has upcoming colonoscopy/endoscopy planned and we were asked to give our recommendations for holding Eliquis. Per Pharmacy and office protocol, patient can hold Eliquis for 2 days prior to procedure as requested. Please restart this as soon as safely possible afterwards.   I will route this recommendation to the requesting party via Epic fax function and remove from pre-op pool.  Please call with questions.  Darreld Mclean, PA-C 06/30/2021, 10:27 AM

## 2021-06-30 NOTE — Telephone Encounter (Signed)
Patient with diagnosis of afib/aflutter on Eliquis for anticoagulation.    Procedure: endoscopy/colonoscopy Date of procedure: 08/21/21  CHA2DS2-VASc Score = 5  This indicates a 7.2% annual risk of stroke. The patient's score is based upon: CHF History: 0 HTN History: 1 Diabetes History: 1 Stroke History: 0 Vascular Disease History: 1 Age Score: 2 Gender Score: 0  CrCl 68mL/min Platelet count 149K  Per office protocol, patient can hold Eliquis for 2 days prior to procedure as requested.

## 2021-06-30 NOTE — Telephone Encounter (Signed)
Pharmacy, can you please comment on how long Eliquis can be held for upcoming endoscopy/colonoscopy? Clearance forms mentions Xarelto but looks like patient is actually on Eliquis.  Thank you!

## 2021-07-01 ENCOUNTER — Telehealth: Payer: Self-pay

## 2021-07-01 DIAGNOSIS — D509 Iron deficiency anemia, unspecified: Secondary | ICD-10-CM

## 2021-07-01 DIAGNOSIS — R195 Other fecal abnormalities: Secondary | ICD-10-CM

## 2021-07-01 MED ORDER — PLENVU 140 G PO SOLR: 1.0000 | Freq: Once | ORAL | 0 refills | Status: AC

## 2021-07-01 NOTE — Telephone Encounter (Signed)
Gerald Martinez will call pt with ELiquis hold

## 2021-07-01 NOTE — Telephone Encounter (Signed)
Gerald Martinez did his PV today and went over instructions with him per Izora Gala.

## 2021-07-01 NOTE — Telephone Encounter (Signed)
My schedule is 100% booked.  Sorry. Please keep an eye out for any cancellations.  Thanks

## 2021-07-01 NOTE — Telephone Encounter (Signed)
FYI - this patient was one that we saw at the end of January before the March [facility] schedule came out.  He was contacted as soon as March was available and scheduled for a double.  He needs a morning because of his diabetes.  His procedure is 08/21/21.  I called him to let him know I was mailing his instructions to him and how long to hold his Eliquis, etc.  He was very frustrated by the fact his procedure is so far out, concerned that it would be dangerous for him to wait that long.  I told him I would let you know when it was and make sure you didn't have concerns about that length of time.  I looked for cancellations to see if I could move him up but you do not have anything.  I told him I would continue to watch for cancellations just in case.

## 2021-07-02 NOTE — Telephone Encounter (Signed)
Spoke to patient and told him that per cardiology he could hold his Eliquis for 2 days prior to his procedure.  Patient agreed.

## 2021-07-03 ENCOUNTER — Telehealth: Payer: Self-pay

## 2021-07-03 NOTE — Telephone Encounter (Signed)
-----   Message from Irene Shipper, MD sent at 07/03/2021 12:28 PM EST ----- Regarding: Colonoscopy/Endo in [place],  Contact this patient to see if he can take the colonoscopy/upper endoscopy slot on Tuesday, February 14 at 9 AM.  This is the slot that Kathaleen Maser had (canceled due to COVID).  If he can, then provide instructions.  Magda Paganini was working with him as well.  He called the office to complain that he had to wait until March for his procedures.  We told him that we look for cancellations and plug him in if possible.  It is possible! He is already approved to [facility].  Let me know.  Thanks  Dr. Henrene Pastor

## 2021-07-03 NOTE — Telephone Encounter (Signed)
Left message for pt to call back to see if he can do the procedures on 07/08/21. Awaiting call back.

## 2021-07-03 NOTE — Telephone Encounter (Signed)
Pt rescheduled to 07/08/21 at 9am, he knows to arrive at [place] have you already sent him instructions?

## 2021-07-03 NOTE — Telephone Encounter (Signed)
He said he can just adjust the time by an hour. He said he was good.

## 2021-07-03 NOTE — Telephone Encounter (Signed)
Spoke with patient, told him I would redo his procedure instructions reflecting the new date and time.  He stated his wife would come pick them up.

## 2021-07-08 ENCOUNTER — Encounter: Payer: Self-pay | Admitting: Internal Medicine

## 2021-07-08 ENCOUNTER — Other Ambulatory Visit: Payer: Self-pay

## 2021-07-08 ENCOUNTER — Ambulatory Visit (AMBULATORY_SURGERY_CENTER): Payer: Medicare HMO | Admitting: Internal Medicine

## 2021-07-08 VITALS — BP 118/82 | HR 72 | Temp 98.1°F | Resp 17 | Ht 70.0 in | Wt 206.0 lb

## 2021-07-08 DIAGNOSIS — Z538 Procedure and treatment not carried out for other reasons: Secondary | ICD-10-CM

## 2021-07-08 DIAGNOSIS — D509 Iron deficiency anemia, unspecified: Secondary | ICD-10-CM | POA: Diagnosis not present

## 2021-07-08 DIAGNOSIS — K219 Gastro-esophageal reflux disease without esophagitis: Secondary | ICD-10-CM

## 2021-07-08 DIAGNOSIS — K5730 Diverticulosis of large intestine without perforation or abscess without bleeding: Secondary | ICD-10-CM

## 2021-07-08 DIAGNOSIS — Z8501 Personal history of malignant neoplasm of esophagus: Secondary | ICD-10-CM

## 2021-07-08 DIAGNOSIS — R195 Other fecal abnormalities: Secondary | ICD-10-CM | POA: Diagnosis not present

## 2021-07-08 MED ORDER — NA SULFATE-K SULFATE-MG SULF 17.5-3.13-1.6 GM/177ML PO SOLN: 1.0000 | Freq: Once | ORAL | 0 refills | Status: AC

## 2021-07-08 MED ORDER — SODIUM CHLORIDE 0.9 % IV SOLN: 500.0000 mL | Freq: Once | INTRAVENOUS | Status: DC

## 2021-07-08 NOTE — Progress Notes (Signed)
Sedate, gd SR, tolerated procedure well, VSS, report to RN 

## 2021-07-08 NOTE — Progress Notes (Signed)
HISTORY OF PRESENT ILLNESS:   Gerald Martinez is a 88 y.o. male with past medical history as listed below and a history of short segment Barrett's with focal adenocarcinoma status post subtotal esophagectomy 22 years ago.  He also has a history of adenomatous colon polyps and has aged out of surveillance.  He is sent today by his primary care provider regarding iron deficiency anemia and Hemoccult positive stool.  Review of outside blood work reveals mild anemia with hemoglobin of 11.9.  Microcytosis with MCV 78.4.  Ferritin level 14.1.  Iron saturation 9%.  Stools positive for occult blood.  His last upper endoscopy was April 2009.  He was found to have esophageal stricture and esophagitis.  He is maintained on omeprazole 20 mg daily for GERD.  He denies dysphagia.  His last colonoscopy was performed August 2014.  He was found to have severe diverticulosis.  Otherwise normal.  Patient's GI review of systems is entirely negative.  He is on chronic Eliquis therapy for history of atrial arrhythmia.  His cardiologist is Dr. Percival Spanish.   REVIEW OF SYSTEMS:   All non-GI ROS negative unless otherwise stated in the HPI except for back pain, urinary leakage, hard of hearing       Past Medical History:  Diagnosis Date   AAA (abdominal aortic aneurysm)     Atrial flutter (HCC)     Barrett's esophagus     BPH (benign prostatic hypertrophy)     CAD (coronary artery disease)      Post CABG in 2000.  LIMA to the LAD, SVG to OM, SVG to PDA, last perfusion study in 2012 with no high risk findings   Chronic kidney disease     Colon polyps     Diabetes mellitus      Type II   Dyslipidemia     Esophageal cancer (HCC)     GERD (gastroesophageal reflux disease)     Hearing loss     Hypertension     Iron deficiency anemia     Obesity             Past Surgical History:  Procedure Laterality Date   CORONARY ARTERY BYPASS GRAFT   2000   ESOPHAGECTOMY   2000   LUMBAR DISC ARTHROPLASTY   1980   VENTRAL HERNIA  REPAIR   2001      Social History Gerald Martinez  reports that he quit smoking about 24 years ago. His smoking use included cigarettes. He has a 52.50 pack-year smoking history. He has never used smokeless tobacco. He reports current alcohol use of about 6.0 standard drinks per week. He reports that he does not use drugs.   family history includes Cancer in his brother and sister; Cervical cancer in his sister; Dementia in his brother; Frontotemporal dementia in his brother; Heart disease in his mother; Lymphoma in his father and sister; Memory loss in his sister; Pancreatic cancer (age of onset: 24) in his son; Prostate cancer in his brother; Skin cancer in his sister; Stroke (age of onset: 70) in his mother.        Allergies  Allergen Reactions   Propoxyphene N-Acetaminophen        Not sure if this is accurate per patient. Not sure          PHYSICAL EXAMINATION: Vital signs: BP 132/72    Pulse 75    Ht 5\' 10"  (1.778 m)    Wt 206 lb (93.4 kg)    SpO2  97%    BMI 29.56 kg/m   Constitutional: generally well-appearing, no acute distress Psychiatric: alert and oriented x3, cooperative Eyes: extraocular movements intact, anicteric, conjunctiva pink Mouth: oral pharynx moist, no lesions Neck: supple no lymphadenopathy Cardiovascular: Irregular, no murmur Lungs: clear to auscultation bilaterally Abdomen: soft, nontender, nondistended, no obvious ascites, no peritoneal signs, normal bowel sounds, no organomegaly Rectal: Deferred till colonoscopy Extremities: no clubbing or cyanosis.  Lower extremity edema bilaterally, right greater than left Skin: no lesions on visible extremities Neuro: No focal deficits..  Nerves intact   ASSESSMENT:   1.  Iron deficiency anemia and Hemoccult positive stool in a gentleman on chronic Eliquis therapy 2.  History of adenocarcinoma of the esophagus arising out of Barrett's status post esophagectomy 22 years ago 3.  GERD.  On PPI 4.  History of adenomatous  colon polyps.  Aged out of surveillance.  Last examination 2014 5.  Multiple medical problems.  On chronic anticoagulation therapy     PLAN:   1.  Colonoscopy to evaluate iron deficiency anemia and Hemoccult positive stool in the patient with adenomatous colon polyps.  The patient is HIGH RISK given his age, comorbidities, and the need to address his chronic anticoagulation.The nature of the procedure, as well as the risks, benefits, and alternatives were carefully and thoroughly reviewed with the patient. Ample time for discussion and questions allowed. The patient understood, was satisfied, and agreed to proceed.  2.  Upper endoscopy to evaluate iron deficiency anemia and Hemoccult positive stool in a patient with a prior history of esophageal cancer and GERD.  Patient is high risk as above.The nature of the procedure, as well as the risks, benefits, and alternatives were carefully and thoroughly reviewed with the patient. Ample time for discussion and questions allowed. The patient understood, was satisfied, and agreed to proceed.  3.  Hold Eliquis 2 days prior to the procedure.  We will check with Dr. Percival Spanish to see if this is acceptable.  We would anticipate immediate resumption of anticoagulation therapy pending the outcome of the examinations. 4.  Continue PPI  Patient was seen in the office by myself last month.  Complete H&P as outlined above.  No interval change.  He is now for colonoscopy and upper endoscopy.

## 2021-07-08 NOTE — Op Note (Signed)
[place] Patient Name: Gerald Martinez Procedure Date: 07/08/2021 8:52 AM MRN: [id number] Endoscopist: Docia Chuck. Henrene Pastor , MD Age: 88 Referring MD:  Date of Birth: [birth year] Gender: Male Account #: [id number] Procedure:                Colonoscopy Indications:              Heme positive stool, Iron deficiency anemia Medicines:                Monitored Anesthesia Care Procedure:                Pre-Anesthesia Assessment:                           - Prior to the procedure, a History and Physical                            was performed, and patient medications and                            allergies were reviewed. The patient's tolerance of                            previous anesthesia was also reviewed. The risks                            and benefits of the procedure and the sedation                            options and risks were discussed with the patient.                            All questions were answered, and informed consent                            was obtained. Prior Anticoagulants: The patient has                            taken Eliquis (apixaban), last dose was 3 days                            prior to procedure. ASA Grade Assessment: III - A                            patient with severe systemic disease. After                            reviewing the risks and benefits, the patient was                            deemed in satisfactory condition to undergo the                            procedure.  After obtaining informed consent, the colonoscope                            was passed under direct vision. Throughout the                            procedure, the patient's blood pressure, pulse, and                            oxygen saturations were monitored continuously. The                            CF [id number] #[id number] was introduced through the anus                            with the intention of advancing to the cecum. The                             scope was advanced to the hepatic flexure before                            the procedure was aborted due to poor preparation.                            Medications were given. The rectum was                            photographed. The quality of the bowel preparation                            was poor. The colonoscopy was performed without                            difficulty. The patient tolerated the procedure                            well. The bowel preparation used was GoLYTELY via                            split dose instruction. Scope In: 9:18:25 AM Scope Out: 9:31:59 AM Total Procedure Duration: 0 hours 13 minutes 34 seconds  Findings:                 Many small and large-mouthed diverticula were found                            in the entire examined colon. The procedure was                            aborted in the region of the hepatic flexure due to                            poor preparation. Retroflexed view of the rectum  revealed hemorrhoids. Complications:            No immediate complications. Estimated blood loss:                            None. Estimated Blood Loss:     Estimated blood loss: none. Impression:               - Preparation of the colon was poor.                           - Diverticulosis in the entire examined colon.                           - Incomplete exam due to poor prep. Recommendation:           1. Repeat colonoscopy next available that works for                            the patient in the upcoming weeks because the bowel                            preparation was suboptimal. SEE BELOW                           2. Patient has a contact number available for                            emergencies. The signs and symptoms of potential                            delayed complications were discussed with the                            patient. Return to normal activities tomorrow.                             Written discharge instructions were provided to the                            patient.                           3. Resume previous diet.                           4. Continue present medications.                           5. Resume Eliquis today                           EXTENSIVE PREP REQUIRED FOR NEXT EXAM. Recommend 2                            days of clear liquids, MiraLAX prep the morning 1  day prior to the procedure followed by standard                            split Suprep. Docia Chuck. Henrene Pastor, MD 07/08/2021 9:43:19 AM This report has been signed electronically.

## 2021-07-08 NOTE — Op Note (Signed)
[facility] Patient Name: Gerald Martinez Procedure Date: 07/08/2021 8:51 AM MRN: [id number] Endoscopist: Docia Chuck. Henrene Pastor , MD Age: 88 Referring MD:  Date of Birth: 05-02-1934 Gender: Male Account #: [id number] Procedure:                Upper GI endoscopy Indications:              Iron deficiency anemia, Heme positive stool Medicines:                Monitored Anesthesia Care Procedure:                Pre-Anesthesia Assessment:                           - Prior to the procedure, a History and Physical                            was performed, and patient medications and                            allergies were reviewed. The patient's tolerance of                            previous anesthesia was also reviewed. The risks                            and benefits of the procedure and the sedation                            options and risks were discussed with the patient.                            All questions were answered, and informed consent                            was obtained. Prior Anticoagulants: The patient has                            taken Eliquis (apixaban), last dose was 3 days                            prior to procedure. ASA Grade Assessment: III - A                            patient with severe systemic disease. After                            reviewing the risks and benefits, the patient was                            deemed in satisfactory condition to undergo the                            procedure.  After obtaining informed consent, the endoscope was                            passed under direct vision. Throughout the                            procedure, the patient's blood pressure, pulse, and                            oxygen saturations were monitored continuously. The                            Endoscope was introduced through the mouth, and                            advanced to the second part of duodenum. The upper                             GI endoscopy was accomplished without difficulty.                            The patient tolerated the procedure well. Scope In: Scope Out: Findings:                 There was evidence of subtotal esophagectomy with                            the gastroesophageal anastomosis at 20 cm.                           The stomach was normal status post subtotal                            esophagectomy with pull-through.                           The examined duodenum was normal.                           The visualized stomach was normal on retroflexion. Complications:            No immediate complications. Estimated Blood Loss:     Estimated blood loss: none. Impression:               1. Normal examination status post subtotal                            esophagectomy. Recommendation:           - Patient has a contact number available for                            emergencies. The signs and symptoms of potential                            delayed complications were discussed with the  patient. Return to normal activities tomorrow.                            Written discharge instructions were provided to the                            patient.                           - Resume previous diet.                           - Continue present medications.                           - Resume Eliquis (apixaban) at prior dose today. Docia Chuck. Henrene Pastor, MD 07/08/2021 9:53:47 AM This report has been signed electronically.

## 2021-07-08 NOTE — Progress Notes (Signed)
PT predominately in NSR with short periods of aflutter, BP WNL, Dr Henrene Pastor aware

## 2021-07-08 NOTE — Patient Instructions (Signed)
YOU HAD AN ENDOSCOPIC PROCEDURE TODAY AT [facility]:   Refer to the procedure report that was given to you for any specific questions about what was found during the examination.  If the procedure report does not answer your questions, please call your gastroenterologist to clarify.  If you requested that your care partner not be given the details of your procedure findings, then the procedure report has been included in a sealed envelope for you to review at your convenience later.  **Handout given on diverticulosis**  YOU SHOULD EXPECT: Some feelings of bloating in the abdomen. Passage of more gas than usual.  Walking can help get rid of the air that was put into your GI tract during the procedure and reduce the bloating. If you had a lower endoscopy (such as a colonoscopy or flexible sigmoidoscopy) you may notice spotting of blood in your stool or on the toilet paper. If you underwent a bowel prep for your procedure, you may not have a normal bowel movement for a few days.  Please Note:  You might notice some irritation and congestion in your nose or some drainage.  This is from the oxygen used during your procedure.  There is no need for concern and it should clear up in a day or so.  SYMPTOMS TO REPORT IMMEDIATELY:  Following lower endoscopy (colonoscopy or flexible sigmoidoscopy):  Excessive amounts of blood in the stool  Significant tenderness or worsening of abdominal pains  Swelling of the abdomen that is new, acute  Fever of 100F or higher  Following upper endoscopy (EGD)  Vomiting of blood or coffee ground material  New chest pain or pain under the shoulder blades  Painful or persistently difficult swallowing  New shortness of breath  Fever of 100F or higher  Black, tarry-looking stools  For urgent or emergent issues, a gastroenterologist can be reached at any hour by calling [phone]. Do not use MyChart messaging for urgent concerns.    DIET:  We do  recommend a small meal at first, but then you may proceed to your regular diet.  Drink plenty of fluids but you should avoid alcoholic beverages for 24 hours.  ACTIVITY:  You should plan to take it easy for the rest of today and you should NOT DRIVE or use heavy machinery until tomorrow (because of the sedation medicines used during the test).    FOLLOW UP: Our staff will call the number listed on your records 48-72 hours following your procedure to check on you and address any questions or concerns that you may have regarding the information given to you following your procedure. If we do not reach you, we will leave a message.  We will attempt to reach you two times.  During this call, we will ask if you have developed any symptoms of COVID 19. If you develop any symptoms (ie: fever, flu-like symptoms, shortness of breath, cough etc.) before then, please call [phone].  If you test positive for Covid 19 in the 2 weeks post procedure, please call and report this information to [place].    If any biopsies were taken you will be contacted by phone or by letter within the next 1-3 weeks.  Please call us at [phone] if you have not heard about the biopsies in 3 weeks.    SIGNATURES/CONFIDENTIALITY: You and/or your care partner have signed paperwork which will be entered into your electronic medical record.  These signatures attest to the fact that that the  information above on your After Visit Summary has been reviewed and is understood.  Full responsibility of the confidentiality of this discharge information lies with you and/or your care-partner.

## 2021-07-10 ENCOUNTER — Telehealth: Payer: Self-pay

## 2021-07-10 NOTE — Telephone Encounter (Signed)
°  Follow up Call-  Call back number 07/08/2021  Post procedure Call Back phone  # [phone]  Permission to leave phone message Yes  Some recent data might be hidden     Patient questions:  Do you have a fever, pain , or abdominal swelling? No. Pain Score  0 *  Have you tolerated food without any problems? Yes.    Have you been able to return to your normal activities? Yes.    Do you have any questions about your discharge instructions: Diet   No. Medications  No. Follow up visit  No.  Do you have questions or concerns about your Care? No.  Actions: * If pain score is 4 or above: No action needed, pain <4.

## 2021-07-15 NOTE — Chronic Care Management (AMB) (Signed)
°  Care Management   Note  07/15/2021 Name: Gerald Martinez MRN: [id number] DOB: Dec 29, 1933  Gerald Martinez is a 88 y.o. year old male who is a primary care patient of Kathalene Frames, MD and is actively engaged with the care management team. I reached out to [place] by phone today to assist with re-scheduling a follow up visit with the RN Case Manager  Follow up plan:  Successful outreach made, per pt he is no longer with [facility] and has new pcp, he was appreciative but declined to reschedule a follow up with RNCM.    Julian Hy, [facility] Management  Direct Dial: [phone]

## 2021-07-16 ENCOUNTER — Ambulatory Visit (INDEPENDENT_AMBULATORY_CARE_PROVIDER_SITE_OTHER): Payer: Medicare HMO | Admitting: Podiatry

## 2021-07-16 ENCOUNTER — Encounter: Payer: Self-pay | Admitting: Podiatry

## 2021-07-16 ENCOUNTER — Other Ambulatory Visit: Payer: Self-pay

## 2021-07-16 DIAGNOSIS — E1140 Type 2 diabetes mellitus with diabetic neuropathy, unspecified: Secondary | ICD-10-CM

## 2021-07-16 DIAGNOSIS — B079 Viral wart, unspecified: Secondary | ICD-10-CM | POA: Insufficient documentation

## 2021-07-16 DIAGNOSIS — M79676 Pain in unspecified toe(s): Secondary | ICD-10-CM | POA: Diagnosis not present

## 2021-07-16 DIAGNOSIS — L570 Actinic keratosis: Secondary | ICD-10-CM | POA: Insufficient documentation

## 2021-07-16 DIAGNOSIS — B351 Tinea unguium: Secondary | ICD-10-CM | POA: Diagnosis not present

## 2021-07-16 DIAGNOSIS — L700 Acne vulgaris: Secondary | ICD-10-CM | POA: Insufficient documentation

## 2021-07-16 NOTE — Progress Notes (Signed)
This patient returns to my office for at risk foot care.  This patient requires this care by a professional since this patient will be at risk due to having diabetes and coagulation defect.. Patient is taking eliquis.   This patient is unable to cut nails himself since the patient cannot reach his nails.These nails are painful walking and wearing shoes.  This patient presents for at risk foot care today.  General Appearance  Alert, conversant and in no acute stress.  Vascular  Dorsalis pedis  are  weaklypalpable  bilaterally.  Posterior tibial pulses are absent  B/L. Capillary return is within normal limits  bilaterally. Cold feet Bilaterally. Venous stasis feet/legs  B/L.  Absent digital hair.   Venous congestion feet/toes  B/L.  Neurologic  Senn-Weinstein monofilament wire test within normal limits  bilaterally. Muscle power within normal limits bilaterally.  Nails Thick disfigured discolored nails with subungual debris  from hallux to fifth toes bilaterally. No evidence of bacterial infection or drainage bilaterally.  Orthopedic  No limitations of motion  feet .  No crepitus or effusions noted.  Hammer toes  B/l.  Skin  normotropic skin with no porokeratosis noted bilaterally.  No signs of infections or ulcers noted.     Onychomycosis  Pain in right toes  Pain in left toes  Consent was obtained for treatment procedures.   Mechanical debridement of nails 1-5  bilaterally performed with a nail nipper.  Filed with dremel without incident.    Return office visit    4 months                 Told patient to return for periodic foot care and evaluation due to potential at risk complications.   Gardiner Barefoot DPM

## 2021-07-29 NOTE — Progress Notes (Signed)
This encounter was created in error - please disregard.

## 2021-08-01 ENCOUNTER — Ambulatory Visit: Payer: Self-pay

## 2021-08-01 DIAGNOSIS — E1159 Type 2 diabetes mellitus with other circulatory complications: Secondary | ICD-10-CM

## 2021-08-01 DIAGNOSIS — I10 Essential (primary) hypertension: Secondary | ICD-10-CM

## 2021-08-01 NOTE — Chronic Care Management (AMB) (Signed)
?  Care Management  ? ?Follow Up Note ? ? ?08/01/2021 ?Name: KARANDEEP RESENDE MRN: [id number] DOB: [birth year] ? ? ?Referred by: Kathalene Frames, MD ?Reason for referral : Chronic Care Management ? ? ?Per care guide note from 06/20/21 patient declines further follow up with [facility] chronic case management program.  ? ?Follow Up Plan: No further follow up required:   ? ?Quinn Plowman RN,BSN,CCM ?RN Case Manager ?[facility]  ?[phone] ? ? ?

## 2021-08-15 DIAGNOSIS — R7989 Other specified abnormal findings of blood chemistry: Secondary | ICD-10-CM | POA: Diagnosis not present

## 2021-08-15 DIAGNOSIS — E291 Testicular hypofunction: Secondary | ICD-10-CM | POA: Diagnosis not present

## 2021-08-21 ENCOUNTER — Encounter: Payer: Medicare HMO | Admitting: Internal Medicine

## 2021-08-25 ENCOUNTER — Encounter: Payer: Medicare HMO | Admitting: Family Medicine

## 2021-08-26 ENCOUNTER — Ambulatory Visit (AMBULATORY_SURGERY_CENTER): Payer: Medicare HMO | Admitting: Internal Medicine

## 2021-08-26 ENCOUNTER — Encounter: Payer: Self-pay | Admitting: Internal Medicine

## 2021-08-26 VITALS — BP 145/96 | HR 76 | Temp 98.1°F | Resp 10 | Ht 70.0 in | Wt 206.0 lb

## 2021-08-26 DIAGNOSIS — D509 Iron deficiency anemia, unspecified: Secondary | ICD-10-CM | POA: Diagnosis not present

## 2021-08-26 DIAGNOSIS — I10 Essential (primary) hypertension: Secondary | ICD-10-CM | POA: Diagnosis not present

## 2021-08-26 DIAGNOSIS — K5730 Diverticulosis of large intestine without perforation or abscess without bleeding: Secondary | ICD-10-CM

## 2021-08-26 DIAGNOSIS — R195 Other fecal abnormalities: Secondary | ICD-10-CM | POA: Diagnosis not present

## 2021-08-26 DIAGNOSIS — K648 Other hemorrhoids: Secondary | ICD-10-CM | POA: Diagnosis not present

## 2021-08-26 DIAGNOSIS — D122 Benign neoplasm of ascending colon: Secondary | ICD-10-CM | POA: Diagnosis not present

## 2021-08-26 DIAGNOSIS — E119 Type 2 diabetes mellitus without complications: Secondary | ICD-10-CM | POA: Diagnosis not present

## 2021-08-26 DIAGNOSIS — I2510 Atherosclerotic heart disease of native coronary artery without angina pectoris: Secondary | ICD-10-CM | POA: Diagnosis not present

## 2021-08-26 MED ORDER — SODIUM CHLORIDE 0.9 % IV SOLN: 500.0000 mL | Freq: Once | INTRAVENOUS | Status: DC

## 2021-08-26 NOTE — Op Note (Signed)
[facility] ?Patient Name: Gerald Martinez ?Procedure Date: 08/26/2021 12:24 PM ?MRN: [id number] ?Endoscopist: Docia Chuck. Henrene Pastor , MD ?Age: 88 ?Referring MD:  ?Date of Birth: [birth year] ?Gender: Male ?Account #: [id number] ?Procedure:                Colonoscopy with cold snare polypectomy x 1 ?Indications:              Heme positive stool, Iron deficiency anemia.  ?                          Previous examinations 2004, 2009, 2014. More  ?                          recently July 08, 2021 (aborted due to poor  ?                          prep) ?Medicines:                Monitored Anesthesia Care ?Procedure:                Pre-Anesthesia Assessment: ?                          - Prior to the procedure, a History and Physical  ?                          was performed, and patient medications and  ?                          allergies were reviewed. The patient's tolerance of  ?                          previous anesthesia was also reviewed. The risks  ?                          and benefits of the procedure and the sedation  ?                          options and risks were discussed with the patient.  ?                          All questions were answered, and informed consent  ?                          was obtained. Prior Anticoagulants: The patient has  ?                          taken Eliquis (apixaban), last dose was 3 days  ?                          prior to procedure. ASA Grade Assessment: III - A  ?                          patient with severe systemic disease. After  ?  reviewing the risks and benefits, the patient was  ?                          deemed in satisfactory condition to undergo the  ?                          procedure. ?                          After obtaining informed consent, the colonoscope  ?                          was passed under direct vision. Throughout the  ?                          procedure, the patient's blood pressure, pulse, and  ?                           oxygen saturations were monitored continuously. The  ?                          Olympus CF-HQ190L (#[id number]) Colonoscope was  ?                          introduced through the anus and advanced to the the  ?                          cecum, identified by appendiceal orifice and  ?                          ileocecal valve. The ileocecal valve, appendiceal  ?                          orifice, and rectum were photographed. The quality  ?                          of the bowel preparation was good. The colonoscopy  ?                          was performed with difficulty (tortuous colon) with  ?                          cecal intubation achieved after moving the patient  ?                          in multiple positions and applying counterpressure.  ?                          The patient tolerated the procedure well. The bowel  ?                          preparation used was extensive with MiraLAX  ?  followed by SUPREP via split dose instruction. ?Scope In: 1:47:45 PM ?Scope Out: 2:15:43 PM ?Scope Withdrawal Time: 0 hours 11 minutes 33 seconds  ?Total Procedure Duration: 0 hours 27 minutes 58 seconds  ?Findings:                 A 8 mm polyp was found in the ascending colon. The  ?                          polyp was removed with a cold snare. Resection and  ?                          retrieval were complete. ?                          Many small and large-mouthed diverticula were found  ?                          in the entire colon. ?                          Hemorrhoids were found during retroflexion. The  ?                          hemorrhoids were moderate. ?                          The exam was otherwise without abnormality on  ?                          direct and retroflexion views. Tortuous colon ?Complications:            No immediate complications. Estimated blood loss:  ?                          None. ?Estimated Blood Loss:     Estimated blood loss: none. ?Impression:               -  One 8 mm polyp in the ascending colon, removed  ?                          with a cold snare. Resected and retrieved. ?                          - Diverticulosis in the entire examined colon. ?                          - Hemorrhoids. Tortuous colon ?                          - The examination was otherwise normal on direct  ?                          and retroflexion views. ?Recommendation:           - Repeat colonoscopy is not recommended for  ?  surveillance. ?                          - Resume Eliquis (apixaban) today at prior dose. ?                          - Patient has a contact number available for  ?                          emergencies. The signs and symptoms of potential  ?                          delayed complications were discussed with the  ?                          patient. Return to normal activities tomorrow.  ?                          Written discharge instructions were provided to the  ?                          patient. ?                          - Resume previous diet. ?                          - Continue present medications. ?                          - Await pathology results. ?Docia Chuck. Henrene Pastor, MD ?08/26/2021 2:25:49 PM ?This report has been signed electronically. ?

## 2021-08-26 NOTE — Progress Notes (Signed)
Approx 1407 pt started gupping like he was going to vomit.  Sedation stopped and suction started.  Suction got nothing out.  No more sedation was given.  Right at end of procedure, pt spit out small amount of bile colored fluid.  Pt could cough and swallow on command.  Sats never dropped BS clear in PACU ?

## 2021-08-26 NOTE — Progress Notes (Signed)
Pt's states no medical or surgical changes since previsit or office visit. 

## 2021-08-26 NOTE — Patient Instructions (Signed)
?RESUME ELIQUIS (APIXABAN) TODAY AT PRIOR DOSE.  ? ?Handouts on hemorrhoids, diverticulosis and polyps given.  ? ?Await pathology results.  ? ? ?YOU HAD AN ENDOSCOPIC PROCEDURE TODAY AT [facility]:   Refer to the procedure report that was given to you for any specific questions about what was found during the examination.  If the procedure report does not answer your questions, please call your gastroenterologist to clarify.  If you requested that your care partner not be given the details of your procedure findings, then the procedure report has been included in a sealed envelope for you to review at your convenience later. ? ?YOU SHOULD EXPECT: Some feelings of bloating in the abdomen. Passage of more gas than usual.  Walking can help get rid of the air that was put into your GI tract during the procedure and reduce the bloating. If you had a lower endoscopy (such as a colonoscopy or flexible sigmoidoscopy) you may notice spotting of blood in your stool or on the toilet paper. If you underwent a bowel prep for your procedure, you may not have a normal bowel movement for a few days. ? ?Please Note:  You might notice some irritation and congestion in your nose or some drainage.  This is from the oxygen used during your procedure.  There is no need for concern and it should clear up in a day or so. ? ?SYMPTOMS TO REPORT IMMEDIATELY: ? ?Following lower endoscopy (colonoscopy or flexible sigmoidoscopy): ? Excessive amounts of blood in the stool ? Significant tenderness or worsening of abdominal pains ? Swelling of the abdomen that is new, acute ? Fever of 100?F or higher ? ? ?For urgent or emergent issues, a gastroenterologist can be reached at any hour by calling [phone]. ?Do not use MyChart messaging for urgent concerns.  ? ? ?DIET:  We do recommend a small meal at first, but then you may proceed to your regular diet.  Drink plenty of fluids but you should avoid alcoholic beverages for 24  hours. ? ?ACTIVITY:  You should plan to take it easy for the rest of today and you should NOT DRIVE or use heavy machinery until tomorrow (because of the sedation medicines used during the test).   ? ?FOLLOW UP: ?Our staff will call the number listed on your records 48-72 hours following your procedure to check on you and address any questions or concerns that you may have regarding the information given to you following your procedure. If we do not reach you, we will leave a message.  We will attempt to reach you two times.  During this call, we will ask if you have developed any symptoms of COVID 19. If you develop any symptoms (ie: fever, flu-like symptoms, shortness of breath, cough etc.) before then, please call [phone].  If you test positive for Covid 19 in the 2 weeks post procedure, please call and report this information to [place].   ? ?If any biopsies were taken you will be contacted by phone or by letter within the next 1-3 weeks.  Please call us at [phone] if you have not heard about the biopsies in 3 weeks.  ? ? ?SIGNATURES/CONFIDENTIALITY: ?You and/or your care partner have signed paperwork which will be entered into your electronic medical record.  These signatures attest to the fact that that the information above on your After Visit Summary has been reviewed and is understood.  Full responsibility of the confidentiality of this discharge information lies  with you and/or your care-partner.  ?

## 2021-08-26 NOTE — Progress Notes (Signed)
HISTORY OF PRESENT ILLNESS: ? ?Gerald Martinez is a 88 y.o. male who was evaluated recently for iron deficiency anemia and Hemoccult positive stool.  He underwent upper endoscopy and colonoscopy July 08, 2021.  Colonoscopy was incomplete due to poor preparation.  He has been reprepped and is now for repeat colonoscopy. ? ?REVIEW OF SYSTEMS: ? ?All non-GI ROS negative. ? ?Past Medical History:  ?Diagnosis Date  ? AAA (abdominal aortic aneurysm) ([place])   ? Atrial flutter ([place])   ? Barrett's esophagus   ? BPH (benign prostatic hypertrophy)   ? CAD (coronary artery disease)   ? Post CABG in 2000.  LIMA to the LAD, SVG to OM, SVG to PDA, last perfusion study in 2012 with no high risk findings  ? Cataract   ? Chronic kidney disease   ? Colon polyps   ? Diabetes mellitus   ? Type II  ? Dyslipidemia   ? Esophageal cancer ([place])   ? GERD (gastroesophageal reflux disease)   ? Hearing loss   ? Hypertension   ? Iron deficiency anemia   ? Neuropathy   ? Obesity   ? ? ?Past Surgical History:  ?Procedure Laterality Date  ? ARTERIAL ANEURYSM REPAIR    ? CATARACT EXTRACTION    ? CORONARY ARTERY BYPASS GRAFT  2000  ? ear drum surgery Right   ? ESOPHAGECTOMY  2000  ? [place] ARTHROPLASTY  1980  ? VENTRAL HERNIA REPAIR  2001  ? ? ?Social History ?Mirko Tailor Kagan  reports that he quit smoking about 24 years ago. His smoking use included cigarettes. He has a 52.50 pack-year smoking history. He has never used smokeless tobacco. He reports current alcohol use of about 7.0 standard drinks per week. He reports that he does not use drugs. ? ?family history includes Cancer in his brother and sister; Cervical cancer in his sister; Dementia in his brother; Frontotemporal dementia in his brother; Heart disease in his mother; Lymphoma in his father and sister; Memory loss in his sister; Pancreatic cancer (age of onset: 71) in his son; Prostate cancer in his brother; Skin cancer in his sister; Stroke (age of onset: 76) in his mother. ? ?Allergies   ?Allergen Reactions  ? Propoxyphene N-Acetaminophen   ?  Not sure if this is accurate per patient. Not sure  ? ? ?  ? ?PHYSICAL EXAMINATION: ? ?Vital signs: BP 132/62   Pulse 78   Temp 98.1 ?F (36.7 ?C) (Temporal)   Ht '5\' 10"'$  (1.778 m)   Wt 206 lb (93.4 kg)   SpO2 96%   BMI 29.56 kg/m?  ?General: Well-developed, well-nourished, no acute distress ?HEENT: Sclerae are anicteric, conjunctiva pink. Oral mucosa intact ?Lungs: Clear ?Heart: Regular ?Abdomen: soft, nontender, nondistended, no obvious ascites, no peritoneal signs, normal bowel sounds. No organomegaly. ?Extremities: No edema ?Psychiatric: alert and oriented x3. Cooperative  ? ? ? ?ASSESSMENT: ? ?1.  Iron deficiency anemia ?2.  Heme positive stool ? ? ?PLAN: ? ?1.  Colonoscopy ? ? ? ? ?  ?

## 2021-08-26 NOTE — Progress Notes (Signed)
Called to room to assist during endoscopic procedure.  Patient ID and intended procedure confirmed with present staff. Received instructions for my participation in the procedure from the performing physician.  

## 2021-08-28 ENCOUNTER — Telehealth: Payer: Self-pay

## 2021-08-28 ENCOUNTER — Telehealth: Payer: Self-pay | Admitting: *Deleted

## 2021-08-28 NOTE — Telephone Encounter (Signed)
No answer for post procedure call back. LEft VM. 

## 2021-08-28 NOTE — Telephone Encounter (Signed)
?  Follow up Call- ? ? ?  08/26/2021  ? 12:49 PM 07/08/2021  ?  7:57 AM  ?Call back number  ?Post procedure Call Back phone  # [phone]  ?Permission to leave phone message Yes Yes  ? First follow up call , LVM ?

## 2021-09-01 ENCOUNTER — Encounter: Payer: Self-pay | Admitting: Internal Medicine

## 2021-09-02 ENCOUNTER — Telehealth: Payer: Self-pay | Admitting: Physician Assistant

## 2021-09-02 NOTE — Telephone Encounter (Signed)
Pt called states he feel and hurt his ribs from a fall. Accidentally hung up and tried to call pt several times. Per Kathlee Nations need to be seen at urgent care for rib pains ?

## 2021-09-03 ENCOUNTER — Encounter: Payer: Self-pay | Admitting: Surgery

## 2021-09-03 ENCOUNTER — Ambulatory Visit: Payer: Self-pay

## 2021-09-03 ENCOUNTER — Ambulatory Visit (INDEPENDENT_AMBULATORY_CARE_PROVIDER_SITE_OTHER): Payer: Medicare HMO | Admitting: Surgery

## 2021-09-03 DIAGNOSIS — R0781 Pleurodynia: Secondary | ICD-10-CM

## 2021-09-03 DIAGNOSIS — S2242XA Multiple fractures of ribs, left side, initial encounter for closed fracture: Secondary | ICD-10-CM

## 2021-09-03 NOTE — Progress Notes (Signed)
Office Visit Note   Patient: Gerald Martinez           Date of Birth: 1934/02/17           MRN: [id number] Visit Date: 09/03/2021              Requested by: Kathalene Frames, MD [address], [place],  [place] [id number] PCP: Kathalene Frames, MD   Assessment & Plan: Visit Diagnoses:  1. Rib pain on left side   2. Closed fracture of multiple ribs of left side, initial encounter     Plan: Patient advised that for rib fractures it just takes time to improve.  Avoid any heavy lifting.  Follow-up with Dr. Lorin Mercy in 4 weeks for recheck.  If he has any complications with breathing he should go to the emergency room immediately.  Follow-Up Instructions: Return in about 4 weeks (around 10/01/2021) for with Dr Lorin Mercy recheck rib fractures.   Orders:  Orders Placed This Encounter  Procedures   XR Ribs Unilateral Left   No orders of the defined types were placed in this encounter.     Procedures: No procedures performed   Clinical Data: No additional findings.   Subjective: Chief Complaint  Patient presents with   Middle Back - Follow-up    HPI 88 year old male comes in today with complaints of left-sided rib pain.  Patient states that he fell yesterday after his right foot gave out. To his left side and feels like "ribs are cracked".  Hurts when he takes a breath. Review of Systems No current complaints of cardiopulmonary GI/GU issues  Objective: Vital Signs: There were no vitals taken for this visit.  Physical Exam HENT:     Head: Normocephalic and atraumatic.  Pulmonary:     Effort: No respiratory distress.  Musculoskeletal:     Comments: Patient is have left anterior rib tenderness.  Neurological:     Mental Status: He is alert and oriented to person, place, and time.     Ortho Exam  Specialty Comments:  No specialty comments available.  Imaging: No results found.   PMFS History: Patient Active Problem List   Diagnosis Date  Noted   Chronic atrial fibrillation ([place]) 11/06/2021   Open comedone 07/16/2021   Actinic keratosis 07/16/2021   Viral warts 07/16/2021   Daytime sleepiness 12/23/2020   Weakness of left foot 01/02/2020   Educated about COVID-19 virus infection 11/13/2019   Typical atrial flutter ([place]) 03/28/2019   Dyslipidemia 03/27/2019   Insomnia 03/21/2019   AAA (abdominal aortic aneurysm) ([place]) 10/26/2018   Fatigue 09/07/2017   Numbness 09/07/2017   Bilateral impacted cerumen 06/15/2017   Mixed conductive and sensorineural hearing loss of both ears 06/15/2017   Tympanic membrane perforation, right 06/15/2017   Low back pain 06/01/2017   Balance problem 03/17/2017   History of esophageal cancer 08/06/2016   Pseudophakia of both eyes 01/22/2016   Vitreomacular traction syndrome of left eye 01/22/2016   Allergic rhinitis 01/25/2014   Hyponatremia 05/05/2013   GERD (gastroesophageal reflux disease) 09/01/2011   Encounter for long-term (current) use of other medications 01/19/2011   Overweight(278.02) 12/16/2010   BPH (benign prostatic hyperplasia) 09/15/2010   HYPERCHOLESTEROLEMIA 01/01/2010   CORONARY ATHEROSCLEROSIS NATIVE CORONARY ARTERY 05/30/2009   UNSPEC PERSONAL HX PRESENTING HAZARDS HEALTH 05/30/2009   RASH-NONVESICULAR 10/17/2008   Iron deficiency anemia 01/16/2008   OTHER DYSPHAGIA 08/23/2007   Testicular dysfunction 04/26/2007   Diabetes ([place]) 12/24/2006   Essential hypertension 12/24/2006  Past Medical History:  Diagnosis Date   AAA (abdominal aortic aneurysm) (HCC)    Atrial flutter (HCC)    Barrett's esophagus    BPH (benign prostatic hypertrophy)    CAD (coronary artery disease)    Post CABG in 2000.  LIMA to the LAD, SVG to OM, SVG to PDA, last perfusion study in 2012 with no high risk findings   Cataract    Chronic kidney disease    Colon polyps    Diabetes mellitus    Type II   Dyslipidemia    Esophageal cancer (HCC)    GERD (gastroesophageal reflux disease)     Hearing loss    Hypertension    Iron deficiency anemia    Neuropathy    Obesity     Family History  Problem Relation Age of Onset   Heart disease Mother    Stroke Mother 23   Lymphoma Father    Cancer Sister        Uncertain   Memory loss Sister    Cervical cancer Sister    Lymphoma Sister    Skin cancer Sister    Dementia Brother    Frontotemporal dementia Brother    Prostate cancer Brother    Cancer Brother        cancer of the jaw   Pancreatic cancer Son 45   Colon cancer Neg Hx     Past Surgical History:  Procedure Laterality Date   ARTERIAL ANEURYSM REPAIR     CATARACT EXTRACTION     CORONARY ARTERY BYPASS GRAFT  2000   ear drum surgery Right    ESOPHAGECTOMY  2000   [place] ARTHROPLASTY  1980   VENTRAL HERNIA REPAIR  2001   Social History   Occupational History   Occupation: retired  Tobacco Use   Smoking status: Former    Packs/day: 1.50    Years: 35.00    Total pack years: 52.50    Types: Cigarettes    Quit date: 05/25/1997    Years since quitting: 24.7   Smokeless tobacco: Never  Vaping Use   Vaping Use: Never used  Substance and Sexual Activity   Alcohol use: Yes    Alcohol/week: 7.0 standard drinks of alcohol    Types: 7 Glasses of wine per week    Comment: small glass of wine each night   Drug use: No   Sexual activity: Not Currently

## 2021-09-05 ENCOUNTER — Other Ambulatory Visit: Payer: Self-pay | Admitting: Internal Medicine

## 2021-09-05 DIAGNOSIS — S96911A Strain of unspecified muscle and tendon at ankle and foot level, right foot, initial encounter: Secondary | ICD-10-CM

## 2021-09-05 DIAGNOSIS — S2242XA Multiple fractures of ribs, left side, initial encounter for closed fracture: Secondary | ICD-10-CM | POA: Diagnosis not present

## 2021-09-08 ENCOUNTER — Other Ambulatory Visit: Payer: Self-pay | Admitting: Internal Medicine

## 2021-09-08 ENCOUNTER — Ambulatory Visit
Admission: RE | Admit: 2021-09-08 | Discharge: 2021-09-08 | Disposition: A | Discharge status: Home / Self Care | Payer: Medicare HMO | Source: Ambulatory Visit | Attending: Internal Medicine | Admitting: Internal Medicine

## 2021-09-08 DIAGNOSIS — S96911A Strain of unspecified muscle and tendon at ankle and foot level, right foot, initial encounter: Secondary | ICD-10-CM

## 2021-09-08 DIAGNOSIS — M7989 Other specified soft tissue disorders: Secondary | ICD-10-CM | POA: Diagnosis not present

## 2021-09-10 ENCOUNTER — Ambulatory Visit: Payer: Medicare HMO | Admitting: Podiatry

## 2021-09-11 ENCOUNTER — Ambulatory Visit: Payer: Medicare HMO

## 2021-09-11 ENCOUNTER — Ambulatory Visit: Payer: Medicare HMO | Admitting: Podiatry

## 2021-09-11 DIAGNOSIS — E1140 Type 2 diabetes mellitus with diabetic neuropathy, unspecified: Secondary | ICD-10-CM

## 2021-09-11 DIAGNOSIS — R2681 Unsteadiness on feet: Secondary | ICD-10-CM

## 2021-09-11 DIAGNOSIS — E1149 Type 2 diabetes mellitus with other diabetic neurological complication: Secondary | ICD-10-CM

## 2021-09-11 NOTE — Progress Notes (Signed)
SITUATION ?Patient Name:  Gerald Martinez ?MRN:   [id number] ?Reason for Visit: Evaluation for [facility] Gauntlet AFO ? ?Patient Report: ?Chief Complaint:   Gait abnormality ?Nature of Discomfort/Pain:  Ambulatory ?Location:    right lower extremity ?Onset & Duration:   Gradual and Present longer than 3 months ?Course:    stable ?Aggravating or Alleviating Factors: ambulation ? ?OBJECTIVE DATA & MEASUREMENTS ?Prognosis:    Good ?Duration of use:   5 years ? ?Diagnosis: ?  ICD-10-CM   ?1. Type 2 diabetes, controlled, with neuropathy ([place])  E11.40   ?  ? ? ?GOALS, NECESSITIES, & JUSTIFICATIONS ?Recommended Device: [employer] Brace ?Color:    Black ?Closure:   Velcro ? ?Laterality HCPCS Code Description Justification  ?right [phone] Plastic orthosis, custom molded from a model of the patient, custom fabricated, includes casting and cast preparation. Necessary to provide triplanar support to the foot/ankle complex  ?right L2330 Addition to lower extremity, lacer molded to patient model Necessary to ensure secure hold of orthosis to patient's limb  ?right L2820 Addition to lower extremity orthosis, soft interface for molded plastic below knee section Necessary to relieve pressure on bony prominences  ? ? ?I certify that Gerald Martinez qualifies for and will benefit from an ankle foot orthosis used during ambulation based on meeting all of the following criteria;  ? ?The patient is: ?- Ambulatory, and ?- Has weakness or deformity of the foot and ankle, and ?- Requires stabilization for medical reasons, and ?- Has the potential to benefit functionally ? ?The patient?s medical record contains sufficient documentation of the patients medical condition to substantiate the necessity for the type and quantity of the items ordered. ? ?The goals of this therapy: ?- Improve Mobility ?- Improve Lower Extremity Stability ?- Decrease Pain ?- Facilitate Soft Tissue Healing ?- Facilitate Immobilization, healing and treatment of an  injury ? ?Necessity of Ankle Foot Orthotic molded to patient model: ?A custom (vs. prefabricated) ankle foot orthosis has been prescribed based on the following criteria which are specific to the condition of this patient; ?- The patient could not be fit with a prefabricated AFO ?- The condition necessitating the orthosis is expected to be permanent or of longstanding duration (more than 6 months) ?- There is need to control the ankle or foot in more than one plane ?- The patient has a documented neurological, circulatory, or orthopedic condition that requires custom fabrication over a model to prevent tissue injury ?- The patient has a healing fracture that lacks normal anatomical integrity or anthropometric proportions ? ?I hereby certify that the ankle foot orthotic described above is a rigid or semi-rigid device which is used for the purpose of supporting a weak or deformed body member or restricting or eliminating motion in a diseased or injured part of the body. It is designed to provide support and counterforce on the limb or body part that is being braced. In my opinion, the custom molded ankle foot orthosis is both reasonable and necessary in reference to accepted standards of medical practice in the treatment  ?of the patient condition and rehabilitation. ? ?ACTIONS PERFORMED ?Patient was evaluated and casted for [place] AFO via [place]. Procedure was explained to patient. Patient tolerated procedure. patient selected device color and closure method.  ? ?PLAN ?Patient to return in four to six weeks for fitting and delivery of device. Plan of care was explained to and agreed upon by patient. All questions were answered and concerns addressed. ? ? ? ? ? ?

## 2021-09-12 NOTE — Progress Notes (Signed)
Subjective:  ? ?Patient ID: Gerald Martinez, male   DOB: 88 y.o.   MRN: [id number]  ? ?HPI ?Patient is developing continued gait instability and has had numerous falls now on his right ankle and does have neuropathic changes associated with it.  Patient states that he has broken ribs recently because of the collapse of his ankle and gait instability and he is using a walker but still having falls and is interested in some type of a to try to provide more stability. ? ? ?ROS ? ? ?   ?Objective:  ?Physical Exam  ?Patient is found to have significant reduction of neurological sensation sharp dull vibratory right over left foot.  I did not note any true ankle instability it appears to be more of a problem of neuropathy and loss of proprioception. ? ?   ?Assessment:  ?Patient with unfortunate neuropathic disease who is dealing with chronic falls and gait instability right ? ?   ?Plan:  ?H&P reviewed condition and recommended balance bracing at this time.  I discussed with her him the value of this in the hope that it would reduce his falls and is referred to pedorthist where he is casted for balance bracing ?   ? ? ?

## 2021-09-16 DIAGNOSIS — T148XXA Other injury of unspecified body region, initial encounter: Secondary | ICD-10-CM | POA: Diagnosis not present

## 2021-09-16 DIAGNOSIS — R296 Repeated falls: Secondary | ICD-10-CM | POA: Diagnosis not present

## 2021-09-16 DIAGNOSIS — S2242XD Multiple fractures of ribs, left side, subsequent encounter for fracture with routine healing: Secondary | ICD-10-CM | POA: Diagnosis not present

## 2021-10-01 DIAGNOSIS — M25571 Pain in right ankle and joints of right foot: Secondary | ICD-10-CM | POA: Diagnosis not present

## 2021-10-01 DIAGNOSIS — R262 Difficulty in walking, not elsewhere classified: Secondary | ICD-10-CM | POA: Diagnosis not present

## 2021-10-01 DIAGNOSIS — M25572 Pain in left ankle and joints of left foot: Secondary | ICD-10-CM | POA: Diagnosis not present

## 2021-10-01 DIAGNOSIS — R296 Repeated falls: Secondary | ICD-10-CM | POA: Diagnosis not present

## 2021-10-06 DIAGNOSIS — R296 Repeated falls: Secondary | ICD-10-CM | POA: Diagnosis not present

## 2021-10-06 DIAGNOSIS — M25572 Pain in left ankle and joints of left foot: Secondary | ICD-10-CM | POA: Diagnosis not present

## 2021-10-06 DIAGNOSIS — R262 Difficulty in walking, not elsewhere classified: Secondary | ICD-10-CM | POA: Diagnosis not present

## 2021-10-06 DIAGNOSIS — M25571 Pain in right ankle and joints of right foot: Secondary | ICD-10-CM | POA: Diagnosis not present

## 2021-10-13 DIAGNOSIS — H52203 Unspecified astigmatism, bilateral: Secondary | ICD-10-CM | POA: Diagnosis not present

## 2021-10-13 DIAGNOSIS — E119 Type 2 diabetes mellitus without complications: Secondary | ICD-10-CM | POA: Diagnosis not present

## 2021-10-13 DIAGNOSIS — H4720 Unspecified optic atrophy: Secondary | ICD-10-CM | POA: Diagnosis not present

## 2021-10-14 DIAGNOSIS — M25572 Pain in left ankle and joints of left foot: Secondary | ICD-10-CM | POA: Diagnosis not present

## 2021-10-14 DIAGNOSIS — M25571 Pain in right ankle and joints of right foot: Secondary | ICD-10-CM | POA: Diagnosis not present

## 2021-10-14 DIAGNOSIS — R262 Difficulty in walking, not elsewhere classified: Secondary | ICD-10-CM | POA: Diagnosis not present

## 2021-10-14 DIAGNOSIS — R296 Repeated falls: Secondary | ICD-10-CM | POA: Diagnosis not present

## 2021-10-16 DIAGNOSIS — R296 Repeated falls: Secondary | ICD-10-CM | POA: Diagnosis not present

## 2021-10-16 DIAGNOSIS — M25571 Pain in right ankle and joints of right foot: Secondary | ICD-10-CM | POA: Diagnosis not present

## 2021-10-16 DIAGNOSIS — R262 Difficulty in walking, not elsewhere classified: Secondary | ICD-10-CM | POA: Diagnosis not present

## 2021-10-16 DIAGNOSIS — M25572 Pain in left ankle and joints of left foot: Secondary | ICD-10-CM | POA: Diagnosis not present

## 2021-10-21 ENCOUNTER — Telehealth: Payer: Self-pay

## 2021-10-21 DIAGNOSIS — R296 Repeated falls: Secondary | ICD-10-CM | POA: Diagnosis not present

## 2021-10-21 DIAGNOSIS — M25572 Pain in left ankle and joints of left foot: Secondary | ICD-10-CM | POA: Diagnosis not present

## 2021-10-21 DIAGNOSIS — M25571 Pain in right ankle and joints of right foot: Secondary | ICD-10-CM | POA: Diagnosis not present

## 2021-10-21 DIAGNOSIS — R262 Difficulty in walking, not elsewhere classified: Secondary | ICD-10-CM | POA: Diagnosis not present

## 2021-10-21 NOTE — Telephone Encounter (Signed)
Brace ready - called and lvm for pickup appointment

## 2021-10-23 ENCOUNTER — Ambulatory Visit (INDEPENDENT_AMBULATORY_CARE_PROVIDER_SITE_OTHER): Payer: Medicare HMO

## 2021-10-23 DIAGNOSIS — R2681 Unsteadiness on feet: Secondary | ICD-10-CM | POA: Diagnosis not present

## 2021-10-23 DIAGNOSIS — R262 Difficulty in walking, not elsewhere classified: Secondary | ICD-10-CM | POA: Diagnosis not present

## 2021-10-23 DIAGNOSIS — M25572 Pain in left ankle and joints of left foot: Secondary | ICD-10-CM | POA: Diagnosis not present

## 2021-10-23 DIAGNOSIS — R296 Repeated falls: Secondary | ICD-10-CM | POA: Diagnosis not present

## 2021-10-23 DIAGNOSIS — M25571 Pain in right ankle and joints of right foot: Secondary | ICD-10-CM | POA: Diagnosis not present

## 2021-10-23 NOTE — Progress Notes (Signed)
SITUATION Reason for Visit: Dispensation and Fitting of [place] Patient Report: Patient reports comfort in ambulation and understands all instructions.  OBJECTIVE DATA Patient History / Diagnosis:     ICD-10-CM   1. Gait instability  R26.81       Provided Device:  Custom Molded Gauntlet: Style [employer] balance brace  Goals of Orthosis: - Improve gait - Decrease energy expenditure during the gait cycle - Improve balance - Stabilize motion at ankle and subtalar joint - Compensate for muscle weakness - Facilitate motion - Provide triplanar ankle and foot stabilization for weight bearing activities  Device Justification: - Patient is ambulatory  - Device is medically necessary as part of the overall treatment due to the patient's condition and related symptoms - It is anticipated that the patient will benefit functionally with use of the device.  - The custom device is utilized in an attempt to avoid the need for surgery and because a prefabricated device is inappropriate.  Upon gait analysis, the device appeared to be fitting well and the patient states that the device is comfortable.  ACTIONS PERFORMED Patient was fit with [facility]. Patient tolerated fitting procedure. Fit of the device is good. Patient was able to apply properly and ambulate without distress. Device function is to restrict and limit motion and provide stabilization in the ankle joint.   Goals and function of this device were explained in detail to the patient. The patient was shown how to properly apply, wear, and care for the device. It was explained that the device will fit and function best in an adjustable-closure shoe with a firm heel counter and a wide base of support. When the device was dispensed, it was suitable for the patient's condition and not substandard. No guarantees were given. Precautions were reviewed.   Written instructions, warranty information, and  a copy of DMEPOS Supplier Standards were provided. All questions answered and concerns addressed.  PLAN Patient is to follow up in one week or as necessary (PRN). Plan of care was discussed with and agreed upon by patient.

## 2021-10-28 DIAGNOSIS — M25571 Pain in right ankle and joints of right foot: Secondary | ICD-10-CM | POA: Diagnosis not present

## 2021-10-28 DIAGNOSIS — R262 Difficulty in walking, not elsewhere classified: Secondary | ICD-10-CM | POA: Diagnosis not present

## 2021-10-28 DIAGNOSIS — M25572 Pain in left ankle and joints of left foot: Secondary | ICD-10-CM | POA: Diagnosis not present

## 2021-10-28 DIAGNOSIS — R296 Repeated falls: Secondary | ICD-10-CM | POA: Diagnosis not present

## 2021-10-30 DIAGNOSIS — R296 Repeated falls: Secondary | ICD-10-CM | POA: Diagnosis not present

## 2021-10-30 DIAGNOSIS — R262 Difficulty in walking, not elsewhere classified: Secondary | ICD-10-CM | POA: Diagnosis not present

## 2021-10-30 DIAGNOSIS — M25571 Pain in right ankle and joints of right foot: Secondary | ICD-10-CM | POA: Diagnosis not present

## 2021-10-30 DIAGNOSIS — M25572 Pain in left ankle and joints of left foot: Secondary | ICD-10-CM | POA: Diagnosis not present

## 2021-11-04 DIAGNOSIS — M25571 Pain in right ankle and joints of right foot: Secondary | ICD-10-CM | POA: Diagnosis not present

## 2021-11-04 DIAGNOSIS — R262 Difficulty in walking, not elsewhere classified: Secondary | ICD-10-CM | POA: Diagnosis not present

## 2021-11-04 DIAGNOSIS — R296 Repeated falls: Secondary | ICD-10-CM | POA: Diagnosis not present

## 2021-11-04 DIAGNOSIS — M25572 Pain in left ankle and joints of left foot: Secondary | ICD-10-CM | POA: Diagnosis not present

## 2021-11-06 DIAGNOSIS — I482 Chronic atrial fibrillation, unspecified: Secondary | ICD-10-CM | POA: Insufficient documentation

## 2021-11-06 NOTE — Progress Notes (Deleted)
Cardiology Office Note   Date:  11/06/2021   ID:  Gerald Martinez, DOB 1933/06/18, MRN [id number]  PCP:  Kathalene Frames, MD  Cardiologist:   Minus Breeding, MD   No chief complaint on file.     History of Present Illness: Gerald Martinez is a 88 y.o. male who presents for follow up of atrial fibrillation and coronary disease.   ***   ***  Prior to the last visit his brother had died and his son had died suddenly from pancreatic cancer.  He had a lot of grief he was dealing with that now he is doing better.  He goes to the gym.  He actually walks on the treadmill.  He does balance exercises at home.  He says that he does get dizzy once in a while.  He will take meclizine and this goes away.  Eating better.  He is concentrating on his sleep and his activity.    He denies any palpitations, presyncope or syncope.  He has had no weight gain or edema.  He does get around slowly with his walker for balance   Past Medical History:  Diagnosis Date   AAA (abdominal aortic aneurysm) (HCC)    Atrial flutter (HCC)    Barrett's esophagus    BPH (benign prostatic hypertrophy)    CAD (coronary artery disease)    Post CABG in 2000.  LIMA to the LAD, SVG to OM, SVG to PDA, last perfusion study in 2012 with no high risk findings   Cataract    Chronic kidney disease    Colon polyps    Diabetes mellitus    Type II   Dyslipidemia    Esophageal cancer (HCC)    GERD (gastroesophageal reflux disease)    Hearing loss    Hypertension    Iron deficiency anemia    Neuropathy    Obesity     Past Surgical History:  Procedure Laterality Date   ARTERIAL ANEURYSM REPAIR     CATARACT EXTRACTION     CORONARY ARTERY BYPASS GRAFT  2000   ear drum surgery Right    ESOPHAGECTOMY  2000   LUMBAR [place]     Current Outpatient Medications  Medication Sig Dispense Refill   apixaban (ELIQUIS) 5 MG TABS tablet Take 1 tablet (5 mg total) by mouth 2  (two) times daily. 56 tablet 0   Blood Glucose Monitoring Suppl (ONE TOUCH ULTRA 2) w/Device KIT CHECK SUGAR ONCE DAILY 1 kit 0   doxazosin (CARDURA) 4 MG tablet TAKE 1 TABLET BY MOUTH DAILY. 90 tablet 3   gabapentin (NEURONTIN) 100 MG capsule      glucose blood (ONETOUCH ULTRA) test strip Check sugar once daily DX E11.59 100 strip 2   ketoconazole (NIZORAL) 2 % cream Apply topically 2 (two) times daily.     losartan (COZAAR) 50 MG tablet      Melatonin 10 MG TABS Take 1 tablet by mouth as needed.     metFORMIN (GLUCOPHAGE-XR) 500 MG 24 hr tablet Take 2 tablets in the morning and 1 tablet at bedtime 270 tablet 3   Multiple Vitamins-Minerals (CENTRUM ADULTS) TABS Take by mouth.     omeprazole (PRILOSEC) 20 MG capsule      OneTouch Delica Lancets 81O MISC Check sugar once daily DX E11.59 100 each 2   simvastatin (ZOCOR) 40 MG tablet      Testosterone 20.25 MG/1.25GM (1.62%) GEL  One half tube every other day     traZODone (DESYREL) 50 MG tablet Take 1 tablet (50 mg total) by mouth at bedtime as needed for sleep. 30 tablet 3   triamcinolone cream (KENALOG) 0.1 %      No current facility-administered medications for this visit.    Allergies:   Propoxyphene n-acetaminophen    ROS:  Please see the history of present illness.   Otherwise, review of systems are positive for ***.   All other systems are reviewed and negative.    PHYSICAL EXAM: VS:  There were no vitals taken for this visit. , BMI There is no height or weight on file to calculate BMI. GENERAL:  Well appearing NECK:  No jugular venous distention, waveform within normal limits, carotid upstroke brisk and symmetric, no bruits, no thyromegaly LUNGS:  Clear to auscultation bilaterally CHEST:  Unremarkable HEART:  PMI not displaced or sustained,S1 and S2 within normal limits, no S3, no S4, no clicks, no rubs, *** murmurs ABD:  Flat, positive bowel sounds normal in frequency in pitch, no bruits, no rebound, no guarding, no midline  pulsatile mass, no hepatomegaly, no splenomegaly EXT:  2 plus pulses throughout, no edema, no cyanosis no clubbing    ***GENERAL:  Well appearing NECK:  No jugular venous distention, waveform within normal limits, carotid upstroke brisk and symmetric, no bruits, no thyromegaly LUNGS:  Clear to auscultation bilaterally CHEST:  Unremarkable HEART:  PMI not displaced or sustained,S1 and S2 within normal limits, no S3,  no clicks, no rubs, no murmurs, irregular ABD:  Flat, positive bowel sounds normal in frequency in pitch, no bruits, no rebound, no guarding, no midline pulsatile mass, no hepatomegaly, no splenomegaly EXT:  2 plus pulses throughout, no edema, no cyanosis no clubbing   EKG:  EKG is *** ordered today. Atrial flutter with variable conduction, rate *** axis within normal limits, intervals within normal limits, no acute ST-T wave changes.  Recent Labs: No results found for requested labs within last 365 days.    Lipid Panel    Component Value Date/Time   CHOL 106 09/17/2020 1458   TRIG 72.0 09/17/2020 1458   HDL 39.70 09/17/2020 1458   CHOLHDL 3 09/17/2020 1458   VLDL 14.4 09/17/2020 1458   LDLCALC 52 09/17/2020 1458   LDLDIRECT 69.0 03/09/2018 1451      Wt Readings from Last 3 Encounters:  08/26/21 206 lb (93.4 kg)  07/08/21 206 lb (93.4 kg)  06/02/21 206 lb (93.4 kg)      Other studies Reviewed: Additional studies/ records that were reviewed today include: ***. Review of the above records demonstrates:  ***  ASSESSMENT AND PLAN:  ATRIAL FLUTTER:   Mr. Gerald Martinez has a CHA2DS2 - VASc score of 5.  ***  He tolerates this rate and anticoagulation.  No change in therapy.  He is careful and not falling.  He is up-to-date with blood work and on the appropriate dose.  CORONARY ATHEROSCLEROSIS NATIVE CORONARY ARTERY: ***  He has no symptoms.  No change in therapy.   HYPERTENSION -   The blood pressure is *** well controlled.  No change in therapy.    Current  medicines are reviewed at length with the patient today.  The patient does not have concerns regarding medicines.  The following changes have been made:  ***  Labs/ tests ordered today include:  ***  No orders of the defined types were placed in this encounter.     Disposition:  FU with me in *** months.     Signed, Minus Breeding, MD  11/06/2021 9:26 PM    [place]

## 2021-11-07 ENCOUNTER — Ambulatory Visit: Payer: Medicare HMO | Admitting: Cardiology

## 2021-11-10 DIAGNOSIS — R262 Difficulty in walking, not elsewhere classified: Secondary | ICD-10-CM | POA: Diagnosis not present

## 2021-11-10 DIAGNOSIS — M25571 Pain in right ankle and joints of right foot: Secondary | ICD-10-CM | POA: Diagnosis not present

## 2021-11-10 DIAGNOSIS — R296 Repeated falls: Secondary | ICD-10-CM | POA: Diagnosis not present

## 2021-11-10 DIAGNOSIS — M25572 Pain in left ankle and joints of left foot: Secondary | ICD-10-CM | POA: Diagnosis not present

## 2021-11-10 DIAGNOSIS — R7989 Other specified abnormal findings of blood chemistry: Secondary | ICD-10-CM | POA: Diagnosis not present

## 2021-11-12 DIAGNOSIS — M25572 Pain in left ankle and joints of left foot: Secondary | ICD-10-CM | POA: Diagnosis not present

## 2021-11-12 DIAGNOSIS — R262 Difficulty in walking, not elsewhere classified: Secondary | ICD-10-CM | POA: Diagnosis not present

## 2021-11-12 DIAGNOSIS — M25571 Pain in right ankle and joints of right foot: Secondary | ICD-10-CM | POA: Diagnosis not present

## 2021-11-12 DIAGNOSIS — R296 Repeated falls: Secondary | ICD-10-CM | POA: Diagnosis not present

## 2021-11-17 ENCOUNTER — Other Ambulatory Visit: Payer: Self-pay | Admitting: Cardiology

## 2021-11-17 DIAGNOSIS — M25572 Pain in left ankle and joints of left foot: Secondary | ICD-10-CM | POA: Diagnosis not present

## 2021-11-17 DIAGNOSIS — R296 Repeated falls: Secondary | ICD-10-CM | POA: Diagnosis not present

## 2021-11-17 DIAGNOSIS — R262 Difficulty in walking, not elsewhere classified: Secondary | ICD-10-CM | POA: Diagnosis not present

## 2021-11-17 DIAGNOSIS — M25571 Pain in right ankle and joints of right foot: Secondary | ICD-10-CM | POA: Diagnosis not present

## 2021-11-19 DIAGNOSIS — M25571 Pain in right ankle and joints of right foot: Secondary | ICD-10-CM | POA: Diagnosis not present

## 2021-11-19 DIAGNOSIS — R262 Difficulty in walking, not elsewhere classified: Secondary | ICD-10-CM | POA: Diagnosis not present

## 2021-11-19 DIAGNOSIS — M25572 Pain in left ankle and joints of left foot: Secondary | ICD-10-CM | POA: Diagnosis not present

## 2021-11-19 DIAGNOSIS — R296 Repeated falls: Secondary | ICD-10-CM | POA: Diagnosis not present

## 2021-11-24 DIAGNOSIS — R262 Difficulty in walking, not elsewhere classified: Secondary | ICD-10-CM | POA: Diagnosis not present

## 2021-11-24 DIAGNOSIS — R296 Repeated falls: Secondary | ICD-10-CM | POA: Diagnosis not present

## 2021-11-24 DIAGNOSIS — M25571 Pain in right ankle and joints of right foot: Secondary | ICD-10-CM | POA: Diagnosis not present

## 2021-11-24 DIAGNOSIS — M25572 Pain in left ankle and joints of left foot: Secondary | ICD-10-CM | POA: Diagnosis not present

## 2021-11-26 ENCOUNTER — Ambulatory Visit: Payer: Medicare HMO | Admitting: Podiatry

## 2021-11-26 ENCOUNTER — Encounter: Payer: Self-pay | Admitting: Podiatry

## 2021-11-26 DIAGNOSIS — E1140 Type 2 diabetes mellitus with diabetic neuropathy, unspecified: Secondary | ICD-10-CM

## 2021-11-26 DIAGNOSIS — B351 Tinea unguium: Secondary | ICD-10-CM

## 2021-11-26 DIAGNOSIS — E1149 Type 2 diabetes mellitus with other diabetic neurological complication: Secondary | ICD-10-CM | POA: Diagnosis not present

## 2021-11-26 DIAGNOSIS — R262 Difficulty in walking, not elsewhere classified: Secondary | ICD-10-CM | POA: Diagnosis not present

## 2021-11-26 DIAGNOSIS — M25571 Pain in right ankle and joints of right foot: Secondary | ICD-10-CM | POA: Diagnosis not present

## 2021-11-26 DIAGNOSIS — M79676 Pain in unspecified toe(s): Secondary | ICD-10-CM | POA: Diagnosis not present

## 2021-11-26 DIAGNOSIS — M25572 Pain in left ankle and joints of left foot: Secondary | ICD-10-CM | POA: Diagnosis not present

## 2021-11-26 DIAGNOSIS — R296 Repeated falls: Secondary | ICD-10-CM | POA: Diagnosis not present

## 2021-11-26 NOTE — Progress Notes (Signed)
This patient returns to my office for at risk foot care.  This patient requires this care by a professional since this patient will be at risk due to having diabetes and coagulation defect.. Patient is taking eliquis.   This patient is unable to cut nails himself since the patient cannot reach his nails.These nails are painful walking and wearing shoes.  This patient presents for at risk foot care today.  General Appearance  Alert, conversant and in no acute stress.  Vascular  Dorsalis pedis  are  weaklypalpable  bilaterally.  Posterior tibial pulses are absent  B/L. Capillary return is within normal limits  bilaterally. Cold feet Bilaterally. Venous stasis feet/legs  B/L.  Absent digital hair.   Venous congestion feet/toes  B/L.  Neurologic  Senn-Weinstein monofilament wire test diminished   bilaterally. Muscle power within normal limits bilaterally.  Nails Thick disfigured discolored nails with subungual debris  from hallux to fifth toes bilaterally. No evidence of bacterial infection or drainage bilaterally.  Orthopedic  No limitations of motion  feet .  No crepitus or effusions noted.  Hammer toes  B/l.  Skin  normotropic skin with no porokeratosis noted bilaterally.  No signs of infections or ulcers noted.     Onychomycosis  Pain in right toes  Pain in left toes  Consent was obtained for treatment procedures.   Mechanical debridement of nails 1-5  bilaterally performed with a nail nipper.  Filed with dremel without incident.    Return office visit    4 months                 Told patient to return for periodic foot care and evaluation due to potential at risk complications.   Cleland Simkins DPM  

## 2021-12-01 DIAGNOSIS — R262 Difficulty in walking, not elsewhere classified: Secondary | ICD-10-CM | POA: Diagnosis not present

## 2021-12-01 DIAGNOSIS — R296 Repeated falls: Secondary | ICD-10-CM | POA: Diagnosis not present

## 2021-12-01 DIAGNOSIS — M25571 Pain in right ankle and joints of right foot: Secondary | ICD-10-CM | POA: Diagnosis not present

## 2021-12-01 DIAGNOSIS — M25572 Pain in left ankle and joints of left foot: Secondary | ICD-10-CM | POA: Diagnosis not present

## 2021-12-04 DIAGNOSIS — R296 Repeated falls: Secondary | ICD-10-CM | POA: Diagnosis not present

## 2021-12-04 DIAGNOSIS — M25571 Pain in right ankle and joints of right foot: Secondary | ICD-10-CM | POA: Diagnosis not present

## 2021-12-04 DIAGNOSIS — R262 Difficulty in walking, not elsewhere classified: Secondary | ICD-10-CM | POA: Diagnosis not present

## 2021-12-04 DIAGNOSIS — M25572 Pain in left ankle and joints of left foot: Secondary | ICD-10-CM | POA: Diagnosis not present

## 2021-12-25 ENCOUNTER — Telehealth: Payer: Self-pay | Admitting: *Deleted

## 2021-12-25 NOTE — Telephone Encounter (Signed)
The patient came in with driving restrictions forms to be filled out by Dr. Percival Spanish. He has been advised that Dr. Percival Spanish will be back in the office next week. We will call back with an update if they can be filled out. Forms given to his nurse.

## 2022-01-01 NOTE — Progress Notes (Signed)
Cardiology Office Note   Date:  01/02/2022   ID:  Gerald Martinez, DOB [birth year], MRN [id number]  PCP:  Kathalene Frames, MD  Cardiologist:   Minus Breeding, MD   Chief Complaint  Patient presents with   Atrial Fibrillation   Coronary Artery Disease      History of Present Illness: Gerald Martinez is a 88 y.o. male who presents for follow up of atrial fibrillation and coronary disease. Since I last saw him he has had no new acute cardiac problems.  He has had leg weakness and has worked with physical therapy and he is doing much better.  He says he is actually doing a fair amount of exercising and he denies any chest pressure, neck or arm discomfort.  He does not notice his atrial flutter.  He has no new shortness of breath, PND or orthopnea.  He has no new palpitations, presyncope or syncope.   Past Medical History:  Diagnosis Date   AAA (abdominal aortic aneurysm) (HCC)    Atrial flutter (HCC)    Barrett's esophagus    BPH (benign prostatic hypertrophy)    CAD (coronary artery disease)    Post CABG in 2000.  LIMA to the LAD, SVG to OM, SVG to PDA, last perfusion study in 2012 with no high risk findings   Cataract    Chronic kidney disease    Colon polyps    Diabetes mellitus    Type II   Dyslipidemia    Esophageal cancer (HCC)    GERD (gastroesophageal reflux disease)    Hearing loss    Hypertension    Iron deficiency anemia    Neuropathy    Obesity     Past Surgical History:  Procedure Laterality Date   ARTERIAL ANEURYSM REPAIR     CATARACT EXTRACTION     CORONARY ARTERY BYPASS GRAFT  2000   ear drum surgery Right    ESOPHAGECTOMY  2000   LUMBAR [place]     Current Outpatient Medications  Medication Sig Dispense Refill   Blood Glucose Monitoring Suppl (ONE TOUCH ULTRA 2) w/Device KIT CHECK SUGAR ONCE DAILY 1 kit 0   doxazosin (CARDURA) 4 MG tablet TAKE 1 TABLET BY MOUTH DAILY. 90 tablet 3   ELIQUIS 5 MG  TABS tablet TAKE 1 TABLET BY MOUTH 2 TIMES DAILY. 120 tablet 1   gabapentin (NEURONTIN) 100 MG capsule      glucose blood (ONETOUCH ULTRA) test strip Check sugar once daily DX E11.59 100 strip 2   ketoconazole (NIZORAL) 2 % cream Apply topically 2 (two) times daily.     losartan (COZAAR) 50 MG tablet      Melatonin 10 MG TABS Take 1 tablet by mouth as needed.     metFORMIN (GLUCOPHAGE-XR) 500 MG 24 hr tablet Take 2 tablets in the morning and 1 tablet at bedtime 270 tablet 3   Multiple Vitamins-Minerals (CENTRUM ADULTS) TABS Take by mouth.     omeprazole (PRILOSEC) 20 MG capsule      OneTouch Delica Lancets 89F MISC Check sugar once daily DX E11.59 100 each 2   simvastatin (ZOCOR) 40 MG tablet      Testosterone 20.25 MG/1.25GM (1.62%) GEL One half tube every other day     traZODone (DESYREL) 50 MG tablet Take 1 tablet (50 mg total) by mouth at bedtime as needed for sleep. 30 tablet 3   triamcinolone cream (KENALOG) 0.1 %  No current facility-administered medications for this visit.    Allergies:   Propoxyphene n-acetaminophen    ROS:  Please see the history of present illness.   Otherwise, review of systems are positive for none.   All other systems are reviewed and negative.    PHYSICAL EXAM: VS:  BP (!) 140/62   Pulse 60   Ht '5\' 10"'  (1.778 m)   Wt 198 lb 3.2 oz (89.9 kg)   SpO2 98%   BMI 28.44 kg/m  , BMI Body mass index is 28.44 kg/m. GENERAL:  Well appearing NECK:  No jugular venous distention, waveform within normal limits, carotid upstroke brisk and symmetric, no bruits, no thyromegaly LUNGS:  Clear to auscultation bilaterally CHEST:  Well healed sternotomy scar. HEART:  PMI not displaced or sustained,S1 and S2 within normal limits, no S3, no clicks, no rubs, no murmurs, irregular ABD:  Flat, positive bowel sounds normal in frequency in pitch, no bruits, no rebound, no guarding, no midline pulsatile mass, no hepatomegaly, no splenomegaly EXT:  2 plus pulses throughout,  no edema, no cyanosis no clubbing   EKG:  EKG is  ordered today. Atrial flutter, variable conduction rate 60, axis within normal limits, intervals within normal limits, no acute ST-T wave changes.  Recent Labs: No results found for requested labs within last 365 days.    Lipid Panel    Component Value Date/Time   CHOL 106 09/17/2020 1458   TRIG 72.0 09/17/2020 1458   HDL 39.70 09/17/2020 1458   CHOLHDL 3 09/17/2020 1458   VLDL 14.4 09/17/2020 1458   LDLCALC 52 09/17/2020 1458   LDLDIRECT 69.0 03/09/2018 1451      Wt Readings from Last 3 Encounters:  01/02/22 198 lb 3.2 oz (89.9 kg)  08/26/21 206 lb (93.4 kg)  07/08/21 206 lb (93.4 kg)      Other studies Reviewed: Additional studies/ records that were reviewed today include: Labs. Review of the above records demonstrates: See elsewhere  ASSESSMENT AND PLAN:  ATRIAL FLUTTER:   Gerald Martinez has a CHA2DS2 - VASc score of 5.  He tolerates anticoagulation.  He does not notice his heart rhythm.  No change in therapy.    CORONARY ATHEROSCLEROSIS NATIVE CORONARY ARTERY: He has no new symptoms.  He will continue with risk reduction.   HYPERTENSION -   The blood pressure is at target.  He will continue the meds as listed.  Well controlled.  No change in therapy.    Current medicines are reviewed at length with the patient today.  The patient does not have concerns regarding medicines.  The following changes have been made:  None  Labs/ tests ordered today include:  None    Orders Placed This Encounter  Procedures   EKG 12-Lead      Disposition:   FU with me in 12 months.     Signed, Minus Breeding, MD  01/02/2022 2:53 PM    [facility]

## 2022-01-02 ENCOUNTER — Ambulatory Visit: Payer: Medicare HMO | Admitting: Cardiology

## 2022-01-02 ENCOUNTER — Encounter: Payer: Self-pay | Admitting: Cardiology

## 2022-01-02 VITALS — BP 140/62 | HR 60 | Ht 70.0 in | Wt 198.2 lb

## 2022-01-02 DIAGNOSIS — I2510 Atherosclerotic heart disease of native coronary artery without angina pectoris: Secondary | ICD-10-CM

## 2022-01-02 DIAGNOSIS — I10 Essential (primary) hypertension: Secondary | ICD-10-CM

## 2022-01-02 DIAGNOSIS — I4892 Unspecified atrial flutter: Secondary | ICD-10-CM | POA: Diagnosis not present

## 2022-01-02 NOTE — Patient Instructions (Signed)
Medication Instructions:  Your Physician recommend you continue on your current medication as directed.    *If you need a refill on your cardiac medications before your next appointment, please call your pharmacy*   Lab Work: None ordered today   Testing/Procedures: None ordered today   Follow-Up: At [facility], you and your health needs are our priority.  As part of our continuing mission to provide you with exceptional heart care, we have created designated Provider Care Teams.  These Care Teams include your primary Cardiologist (physician) and Advanced Practice Providers (APPs -  Physician Assistants and Nurse Practitioners) who all work together to provide you with the care you need, when you need it.  We recommend signing up for the patient portal called "MyChart".  Sign up information is provided on this After Visit Summary.  MyChart is used to connect with patients for Virtual Visits (Telemedicine).  Patients are able to view lab/test results, encounter notes, upcoming appointments, etc.  Non-urgent messages can be sent to your provider as well.   To learn more about what you can do with MyChart, go to [email or web address].    Your next appointment:   1 year(s)  The format for your next appointment:   In Person  Provider:   Minus Breeding, MD {

## 2022-02-04 DIAGNOSIS — L821 Other seborrheic keratosis: Secondary | ICD-10-CM | POA: Diagnosis not present

## 2022-02-04 DIAGNOSIS — L814 Other melanin hyperpigmentation: Secondary | ICD-10-CM | POA: Diagnosis not present

## 2022-02-04 DIAGNOSIS — D225 Melanocytic nevi of trunk: Secondary | ICD-10-CM | POA: Diagnosis not present

## 2022-02-04 DIAGNOSIS — L570 Actinic keratosis: Secondary | ICD-10-CM | POA: Diagnosis not present

## 2022-02-04 DIAGNOSIS — C44399 Other specified malignant neoplasm of skin of other parts of face: Secondary | ICD-10-CM | POA: Diagnosis not present

## 2022-02-09 DIAGNOSIS — D492 Neoplasm of unspecified behavior of bone, soft tissue, and skin: Secondary | ICD-10-CM | POA: Diagnosis not present

## 2022-02-10 DIAGNOSIS — R7989 Other specified abnormal findings of blood chemistry: Secondary | ICD-10-CM | POA: Diagnosis not present

## 2022-03-12 ENCOUNTER — Other Ambulatory Visit: Payer: Self-pay | Admitting: Cardiology

## 2022-03-12 DIAGNOSIS — I482 Chronic atrial fibrillation, unspecified: Secondary | ICD-10-CM

## 2022-03-12 NOTE — Telephone Encounter (Signed)
Prescription refill request for Eliquis received. Indication:Aflutter Last office visit: 01/02/22 (Hochrein) Scr: 1.05 ( 05/07/21 via [place])  Age: 88 Weight: 89.9kg   Appropriate dose and refill sent to requested pharmacy.

## 2022-03-25 DIAGNOSIS — R040 Epistaxis: Secondary | ICD-10-CM | POA: Diagnosis not present

## 2022-03-30 ENCOUNTER — Ambulatory Visit: Payer: Medicare HMO | Admitting: Podiatry

## 2022-03-30 DIAGNOSIS — Z7901 Long term (current) use of anticoagulants: Secondary | ICD-10-CM | POA: Diagnosis not present

## 2022-03-30 DIAGNOSIS — R040 Epistaxis: Secondary | ICD-10-CM | POA: Diagnosis not present

## 2022-03-30 DIAGNOSIS — H906 Mixed conductive and sensorineural hearing loss, bilateral: Secondary | ICD-10-CM | POA: Diagnosis not present

## 2022-03-30 DIAGNOSIS — H7291 Unspecified perforation of tympanic membrane, right ear: Secondary | ICD-10-CM | POA: Diagnosis not present

## 2022-03-30 DIAGNOSIS — H6121 Impacted cerumen, right ear: Secondary | ICD-10-CM | POA: Diagnosis not present

## 2022-03-31 DIAGNOSIS — R040 Epistaxis: Secondary | ICD-10-CM | POA: Diagnosis not present

## 2022-03-31 DIAGNOSIS — Z7901 Long term (current) use of anticoagulants: Secondary | ICD-10-CM | POA: Diagnosis not present

## 2022-04-01 ENCOUNTER — Ambulatory Visit: Payer: Medicare HMO | Admitting: Podiatry

## 2022-04-06 ENCOUNTER — Ambulatory Visit: Payer: Medicare HMO | Admitting: Podiatry

## 2022-04-06 DIAGNOSIS — Z7901 Long term (current) use of anticoagulants: Secondary | ICD-10-CM | POA: Diagnosis not present

## 2022-04-06 DIAGNOSIS — R040 Epistaxis: Secondary | ICD-10-CM | POA: Diagnosis not present

## 2022-05-04 ENCOUNTER — Encounter: Payer: Self-pay | Admitting: Podiatry

## 2022-05-04 ENCOUNTER — Ambulatory Visit: Payer: Medicare HMO | Admitting: Podiatry

## 2022-05-04 DIAGNOSIS — B351 Tinea unguium: Secondary | ICD-10-CM

## 2022-05-04 DIAGNOSIS — M79676 Pain in unspecified toe(s): Secondary | ICD-10-CM

## 2022-05-04 DIAGNOSIS — E1140 Type 2 diabetes mellitus with diabetic neuropathy, unspecified: Secondary | ICD-10-CM

## 2022-05-04 DIAGNOSIS — E1149 Type 2 diabetes mellitus with other diabetic neurological complication: Secondary | ICD-10-CM

## 2022-05-04 NOTE — Progress Notes (Signed)
This patient returns to my office for at risk foot care.  This patient requires this care by a professional since this patient will be at risk due to having diabetes and coagulation defect.. Patient is taking eliquis.   This patient is unable to cut nails himself since the patient cannot reach his nails.These nails are painful walking and wearing shoes.  This patient presents for at risk foot care today.  General Appearance  Alert, conversant and in no acute stress.  Vascular  Dorsalis pedis  are  weaklypalpable  bilaterally.  Posterior tibial pulses are absent  B/L. Capillary return is within normal limits  bilaterally. Cold feet Bilaterally. Venous stasis feet/legs  B/L.  Absent digital hair.   Venous congestion feet/toes  B/L.  Neurologic  Senn-Weinstein monofilament wire test diminished   bilaterally. Muscle power within normal limits bilaterally.  Nails Thick disfigured discolored nails with subungual debris  from hallux to fifth toes bilaterally. No evidence of bacterial infection or drainage bilaterally.  Orthopedic  No limitations of motion  feet .  No crepitus or effusions noted.  Hammer toes  B/l.  Skin  normotropic skin with no porokeratosis noted bilaterally.  No signs of infections or ulcers noted.     Onychomycosis  Pain in right toes  Pain in left toes  Consent was obtained for treatment procedures.   Mechanical debridement of nails 1-5  bilaterally performed with a nail nipper.  Filed with dremel without incident.    Return office visit    4 months                 Told patient to return for periodic foot care and evaluation due to potential at risk complications.   Gardiner Barefoot DPM

## 2022-05-12 DIAGNOSIS — R7989 Other specified abnormal findings of blood chemistry: Secondary | ICD-10-CM | POA: Diagnosis not present

## 2022-05-20 ENCOUNTER — Ambulatory Visit: Payer: Medicare HMO | Admitting: Podiatry

## 2022-05-28 DIAGNOSIS — R7989 Other specified abnormal findings of blood chemistry: Secondary | ICD-10-CM | POA: Diagnosis not present

## 2022-05-28 DIAGNOSIS — F5101 Primary insomnia: Secondary | ICD-10-CM | POA: Diagnosis not present

## 2022-05-28 DIAGNOSIS — R69 Illness, unspecified: Secondary | ICD-10-CM | POA: Diagnosis not present

## 2022-06-02 DIAGNOSIS — Z85828 Personal history of other malignant neoplasm of skin: Secondary | ICD-10-CM | POA: Diagnosis not present

## 2022-06-02 DIAGNOSIS — L821 Other seborrheic keratosis: Secondary | ICD-10-CM | POA: Diagnosis not present

## 2022-06-02 DIAGNOSIS — Z08 Encounter for follow-up examination after completed treatment for malignant neoplasm: Secondary | ICD-10-CM | POA: Diagnosis not present

## 2022-06-02 DIAGNOSIS — L570 Actinic keratosis: Secondary | ICD-10-CM | POA: Diagnosis not present

## 2022-06-02 DIAGNOSIS — L905 Scar conditions and fibrosis of skin: Secondary | ICD-10-CM | POA: Diagnosis not present

## 2022-06-22 DIAGNOSIS — R7989 Other specified abnormal findings of blood chemistry: Secondary | ICD-10-CM | POA: Diagnosis not present

## 2022-06-22 DIAGNOSIS — Z23 Encounter for immunization: Secondary | ICD-10-CM | POA: Diagnosis not present

## 2022-06-22 DIAGNOSIS — I4892 Unspecified atrial flutter: Secondary | ICD-10-CM | POA: Diagnosis not present

## 2022-06-22 DIAGNOSIS — D6869 Other thrombophilia: Secondary | ICD-10-CM | POA: Diagnosis not present

## 2022-06-22 DIAGNOSIS — Z1331 Encounter for screening for depression: Secondary | ICD-10-CM | POA: Diagnosis not present

## 2022-06-22 DIAGNOSIS — E1159 Type 2 diabetes mellitus with other circulatory complications: Secondary | ICD-10-CM | POA: Diagnosis not present

## 2022-06-22 DIAGNOSIS — R69 Illness, unspecified: Secondary | ICD-10-CM | POA: Diagnosis not present

## 2022-06-22 DIAGNOSIS — G63 Polyneuropathy in diseases classified elsewhere: Secondary | ICD-10-CM | POA: Diagnosis not present

## 2022-06-22 DIAGNOSIS — D649 Anemia, unspecified: Secondary | ICD-10-CM | POA: Diagnosis not present

## 2022-06-22 DIAGNOSIS — I10 Essential (primary) hypertension: Secondary | ICD-10-CM | POA: Diagnosis not present

## 2022-06-22 DIAGNOSIS — I714 Abdominal aortic aneurysm, without rupture, unspecified: Secondary | ICD-10-CM | POA: Diagnosis not present

## 2022-06-22 DIAGNOSIS — Z0000 Encounter for general adult medical examination without abnormal findings: Secondary | ICD-10-CM | POA: Diagnosis not present

## 2022-08-12 DIAGNOSIS — Z5181 Encounter for therapeutic drug level monitoring: Secondary | ICD-10-CM | POA: Diagnosis not present

## 2022-08-18 DIAGNOSIS — I872 Venous insufficiency (chronic) (peripheral): Secondary | ICD-10-CM | POA: Diagnosis not present

## 2022-08-18 DIAGNOSIS — L821 Other seborrheic keratosis: Secondary | ICD-10-CM | POA: Diagnosis not present

## 2022-08-18 DIAGNOSIS — D225 Melanocytic nevi of trunk: Secondary | ICD-10-CM | POA: Diagnosis not present

## 2022-08-18 DIAGNOSIS — L814 Other melanin hyperpigmentation: Secondary | ICD-10-CM | POA: Diagnosis not present

## 2022-09-03 DIAGNOSIS — R350 Frequency of micturition: Secondary | ICD-10-CM | POA: Diagnosis not present

## 2022-09-03 DIAGNOSIS — N401 Enlarged prostate with lower urinary tract symptoms: Secondary | ICD-10-CM | POA: Diagnosis not present

## 2022-09-07 ENCOUNTER — Ambulatory Visit: Payer: Medicare HMO | Admitting: Podiatry

## 2022-09-07 ENCOUNTER — Encounter: Payer: Self-pay | Admitting: Podiatry

## 2022-09-07 DIAGNOSIS — M79676 Pain in unspecified toe(s): Secondary | ICD-10-CM | POA: Diagnosis not present

## 2022-09-07 DIAGNOSIS — E1140 Type 2 diabetes mellitus with diabetic neuropathy, unspecified: Secondary | ICD-10-CM | POA: Diagnosis not present

## 2022-09-07 DIAGNOSIS — B351 Tinea unguium: Secondary | ICD-10-CM | POA: Diagnosis not present

## 2022-09-07 NOTE — Progress Notes (Addendum)
This patient returns to my office for at risk foot care.  This patient requires this care by a professional since this patient will be at risk due to having diabetes and coagulation defect.. Patient is taking eliquis.   This patient is unable to cut nails himself since the patient cannot reach his nails.These nails are painful walking and wearing shoes.  This patient presents for at risk foot care today.  General Appearance  Alert, conversant and in no acute stress.  Vascular  Dorsalis pedis  are  weaklypalpable  bilaterally.  Posterior tibial pulses are absent  B/L. Capillary return is within normal limits  bilaterally. Cold feet Bilaterally. Venous stasis feet/legs  B/L.  Absent digital hair.   Venous congestion feet/toes  B/L.  Neurologic  Senn-Weinstein monofilament wire test diminished   bilaterally. Muscle power within normal limits bilaterally.  Nails Thick disfigured discolored nails with subungual debris  from hallux to fifth toes bilaterally. No evidence of bacterial infection or drainage bilaterally.  Orthopedic  No limitations of motion  feet .  No crepitus or effusions noted.  Hammer toes  B/l.  Skin  normotropic skin with no porokeratosis noted bilaterally.  No signs of infections or ulcers noted.     Onychomycosis  Pain in right toes  Pain in left toes  Consent was obtained for treatment procedures.   Mechanical debridement of nails 1-5  bilaterally performed with a nail nipper.  Filed with dremel without incident. Self treating skin lesion right leg.   Return office visit    4 months                 Told patient to return for periodic foot care and evaluation due to potential at risk complications.   Helane Gunther DPM

## 2022-09-10 DIAGNOSIS — W19XXXS Unspecified fall, sequela: Secondary | ICD-10-CM | POA: Diagnosis not present

## 2022-09-10 DIAGNOSIS — S80811A Abrasion, right lower leg, initial encounter: Secondary | ICD-10-CM | POA: Diagnosis not present

## 2022-09-15 ENCOUNTER — Other Ambulatory Visit: Payer: Self-pay | Admitting: Cardiology

## 2022-09-15 ENCOUNTER — Other Ambulatory Visit: Payer: Self-pay

## 2022-09-15 DIAGNOSIS — I482 Chronic atrial fibrillation, unspecified: Secondary | ICD-10-CM | POA: Diagnosis not present

## 2022-09-15 NOTE — Telephone Encounter (Signed)
[employer] representative wanted to know if patient needed labs obtained. He has come to the office because he had a phone call early.   RN spoke to pharmacist - BMP and CBC needed. Labs ordered.

## 2022-09-15 NOTE — Telephone Encounter (Signed)
Prescription refill request for Eliquis received. Indication:afib Last office visit: 01/02/22 Scr: overdue Age: 89 Weight: 89kg  Called patient and LMOM.

## 2022-09-16 LAB — CBC
Hematocrit: 39.6 % (ref 37.5–51.0)
Hemoglobin: 12.7 g/dL — ABNORMAL LOW (ref 13.0–17.7)
MCH: 28.8 pg (ref 26.6–33.0)
MCHC: 32.1 g/dL (ref 31.5–35.7)
MCV: 90 fL (ref 79–97)
Platelets: 166 10*3/uL (ref 150–450)
RBC: 4.41 x10E6/uL (ref 4.14–5.80)
RDW: 13.3 % (ref 11.6–15.4)
WBC: 4.6 10*3/uL (ref 3.4–10.8)

## 2022-09-16 LAB — BASIC METABOLIC PANEL
BUN/Creatinine Ratio: 16 (ref 10–24)
BUN: 13 mg/dL (ref 8–27)
CO2: 24 mmol/L (ref 20–29)
Calcium: 9.3 mg/dL (ref 8.6–10.2)
Chloride: 99 mmol/L (ref 96–106)
Creatinine, Ser: 0.8 mg/dL (ref 0.76–1.27)
Glucose: 144 mg/dL — ABNORMAL HIGH (ref 70–99)
Potassium: 5 mmol/L (ref 3.5–5.2)
Sodium: 136 mmol/L (ref 134–144)
eGFR: 86 mL/min/{1.73_m2} (ref >59)

## 2022-09-16 NOTE — Telephone Encounter (Signed)
Labs completed 09/15/22 Creat 0.80, age 89, weight 89.9kg, based on specified criteria pt is on appropriate dosage of Eliquis  BID for aflutter.  Will refill rx.

## 2022-09-24 ENCOUNTER — Encounter: Payer: Self-pay | Admitting: *Deleted

## 2022-10-15 DIAGNOSIS — H52201 Unspecified astigmatism, right eye: Secondary | ICD-10-CM | POA: Diagnosis not present

## 2022-10-15 DIAGNOSIS — E119 Type 2 diabetes mellitus without complications: Secondary | ICD-10-CM | POA: Diagnosis not present

## 2022-10-15 DIAGNOSIS — H4720 Unspecified optic atrophy: Secondary | ICD-10-CM | POA: Diagnosis not present

## 2022-10-16 DIAGNOSIS — E1159 Type 2 diabetes mellitus with other circulatory complications: Secondary | ICD-10-CM | POA: Diagnosis not present

## 2022-10-16 DIAGNOSIS — I719 Aortic aneurysm of unspecified site, without rupture: Secondary | ICD-10-CM | POA: Diagnosis not present

## 2022-10-16 DIAGNOSIS — I10 Essential (primary) hypertension: Secondary | ICD-10-CM | POA: Diagnosis not present

## 2022-10-16 DIAGNOSIS — R040 Epistaxis: Secondary | ICD-10-CM | POA: Diagnosis not present

## 2022-10-16 DIAGNOSIS — E1142 Type 2 diabetes mellitus with diabetic polyneuropathy: Secondary | ICD-10-CM | POA: Diagnosis not present

## 2022-11-06 ENCOUNTER — Telehealth: Payer: Self-pay | Admitting: Cardiology

## 2022-11-06 NOTE — Telephone Encounter (Signed)
Patient states he has been having nosebleeds for the past 6 months. For the past 4 days he has had a nosebleed. He has been to ENT twice and he states they have not found anything. He stated his PCP suggest he change from Eliquis but will leave that up to Dr. Antoine Poche. However he suggest he stop Eliquis for 2 days and use a nasal spray for the the next 2 days. Patient would like to discuss changing from Eliquis and are you in agreement with him stopping for 2 days.

## 2022-11-06 NOTE — Telephone Encounter (Signed)
Spoke with pt, aware he is okay to hold eliquis for 2 days and then restart. He was also told he can use a humidifier in the home to keep the air moist. Patient voiced understanding to let us know if he has more problems after restarting eliquis.

## 2022-11-06 NOTE — Telephone Encounter (Signed)
Pt c/o medication issue:  1. Name of Medication:   ELIQUIS 5 MG TABS tablet   2. How are you currently taking this medication (dosage and times per day)? As prescribed  3. Are you having a reaction (difficulty breathing--STAT)?   4. What is your medication issue?   Patient stated he has been having nose bleeds for the last 6 months in his right nostril.  Patient stated he has had nosebleeds recently for the last 4 days in a row.  Patient stated he has been to see his Ear, Nose and Throat doctor to get the bleed cauterized but they could not find the location.  Patient wants to be taken off Eliquis.

## 2022-11-06 NOTE — Telephone Encounter (Signed)
Patient with CHADsVASc score of 5. Do not recommend discontinuing Eliquis permanently. However, can hold for 2 days and use saline nasal spray frequently to keep nares moist.

## 2022-11-19 ENCOUNTER — Telehealth: Payer: Self-pay | Admitting: *Deleted

## 2022-11-19 NOTE — Telephone Encounter (Signed)
Spoke with pt, Follow up scheduled  

## 2022-11-19 NOTE — Telephone Encounter (Signed)
From: Emilio Aspen, MD  Sent: 11/17/2022   3:17 PM EDT  To: Rollene Rotunda, MD  Subject: Patient Care                                   Hello Dr. Antoine Poche,   My name is Eleanora Neighbor.  I am an internal medicine physician with [facility] physicians.  Imaad Reuss is a mutual patient of ours. He is on anticoagulation for atrial flutter.   For the last 6 months or more, he has dealt with recurring nosebleeds.  He has been evaluated by ENT on multiple occasions and told there is no visible vessels to undergo ablation.  He has tried humidified air and intranasal moisturizer along with Afrin as needed to help stop nosebleeds; however, he continues to have recurring bleeds that, per his description, seem to be fairly profuse.  He has on multiple occasions held his Eliquis (2-3 days) which does seem to help stop the bleeds, but they recur once he resumes anticoagulation.  He again contacted me today reporting several nosebleeds over the last week. I have just today advised him to go ahead and hold his Eliquis and to follow-up with you and your team regarding this issue.  He is aware of the risk associated with holding Eliquis.    Cletis Athens

## 2022-11-24 NOTE — Progress Notes (Unsigned)
Cardiology Office Note:   Date:  11/25/2022  ID:  JERIMIAH OSTERBERG, DOB 04-14-1934, MRN [id number] PCP: Emilio Aspen, MD  [facility] Providers Cardiologist:  Rollene Rotunda, MD {  History of Present Illness:   Gerald Martinez is a 89 y.o. male who presents for follow up of atrial fibrillation and coronary disease.  He has had recurrent nosebleeds.  He had a message from his primary provider that he had to have his Eliquis discontinued.  He has seen ENT multiple occasions and I cannot find bleeding source to stop this.  Bleeding stops when he stops taking his Eliquis.  The patient denies any new symptoms such as chest discomfort, neck or arm discomfort. There has been no new shortness of breath, PND or orthopnea. There have been no reported palpitations, presyncope or syncope.  ROS: As stated in the HPI and negative for all other systems.  Studies Reviewed:    EKG:   EKG Interpretation Date/Time:  Wednesday November 25 2022 12:56:40 EDT Ventricular Rate:  58 PR Interval:    QRS Duration:  106 QT Interval:  440 QTC Calculation: 431 R Axis:   81  Text Interpretation: Atrial flutter with variable A-V block When compared with ECG of  01/02/22 No significant change Confirmed by Rollene Rotunda ([id number]) on 11/25/2022 1:04:18 PM     Risk Assessment/Calculations:    CHA2DS2-VASc Score =  5   Physical Exam:   VS:  BP 120/64   Pulse (!) 58   Ht 5' 9.5" (1.765 m)   Wt 203 lb (92.1 kg)   SpO2 95%   BMI 29.55 kg/m    Wt Readings from Last 3 Encounters:  11/25/22 203 lb (92.1 kg)  01/02/22 198 lb 3.2 oz (89.9 kg)  08/26/21 206 lb (93.4 kg)     GEN: Well nourished, well developed in no acute distress NECK: No JVD; No carotid bruits CARDIAC: Irregular RRR, no murmurs, rubs, gallops RESPIRATORY:  Clear to auscultation without rales, wheezing or rhonchi  ABDOMEN: Soft, non-tender, non-distended EXTREMITIES:  No edema; No deformity   ASSESSMENT AND PLAN:   ATRIAL FLUTTER:    Mr. Kamarie Kammeyer Haslam has a CHA2DS2 - VASc score of 5.  He had recurrent nosebleeds and cannot take anticoagulation.  Communicated with Dr. Lalla Brothers to consider Watchman vs ablation.  I would lean towards watchman.  The patient would consider this.  He has a very high functional level despite using a walker for balance.   I have seen WAHEED CASSELL is a 89 y.o. male in the office today who is being considered for a Watchman left atrial appendage closure devIrrice.  He has a history of chronic atrial flutter.  This patients CHA2DS2-VASc Score and unadjusted Ischemic Stroke Rate (% per year) is equal to 7.2 % stroke rate/year from a score of 5 which necessitates long term oral anticoagulation to prevent stroke.  Unfortunately, He is not felt to be a long term Warfarin candidate secondary to recurrent nose bleeds.  The patients chart has been reviewed and I feel that they would be a candidate for short term oral anticoagulation.  Procedural risks for the Watchman implant have been reviewed with the patient including a 1% risk of stroke, 2% risk of perforation, 0.1% risk of device embolization.  Given the patient's poor candidacy for long-term oral anticoagulation and ability to tolerate short term oral anticoagulation I have recommended the watchman left atrial appendage closure system.   CORONARY ATHEROSCLEROSIS NATIVE CORONARY ARTERY: The  patient has no new sypmtoms.  No further cardiovascular testing is indicated.  We will continue with aggressive risk reduction and meds as listed.   HYPERTENSION -   The blood pressure is at target.  No change in therapy.    well controlled.  No change in therapy.        Follow up with me in six months.   Signed, Rollene Rotunda, MD

## 2022-11-25 ENCOUNTER — Ambulatory Visit: Payer: Medicare HMO | Attending: Cardiology | Admitting: Cardiology

## 2022-11-25 ENCOUNTER — Encounter: Payer: Self-pay | Admitting: Cardiology

## 2022-11-25 VITALS — BP 120/64 | HR 58 | Ht 69.5 in | Wt 203.0 lb

## 2022-11-25 DIAGNOSIS — I2510 Atherosclerotic heart disease of native coronary artery without angina pectoris: Secondary | ICD-10-CM

## 2022-11-25 DIAGNOSIS — I483 Typical atrial flutter: Secondary | ICD-10-CM

## 2022-11-25 DIAGNOSIS — I10 Essential (primary) hypertension: Secondary | ICD-10-CM | POA: Diagnosis not present

## 2022-11-25 NOTE — Patient Instructions (Signed)
  Follow-Up: At [facility], you and your health needs are our priority.  As part of our continuing mission to provide you with exceptional heart care, we have created designated Provider Care Teams.  These Care Teams include your primary Cardiologist (physician) and Advanced Practice Providers (APPs -  Physician Assistants and Nurse Practitioners) who all work together to provide you with the care you need, when you need it.  We recommend signing up for the patient portal called "MyChart".  Sign up information is provided on this After Visit Summary.  MyChart is used to connect with patients for Virtual Visits (Telemedicine).  Patients are able to view lab/test results, encounter notes, upcoming appointments, etc.  Non-urgent messages can be sent to your provider as well.   To learn more about what you can do with MyChart, go to [email or web address].    Your next appointment:   6 month(s)  Provider:   Farrell Hochrein, MD      

## 2022-12-24 DIAGNOSIS — D6869 Other thrombophilia: Secondary | ICD-10-CM | POA: Diagnosis not present

## 2022-12-24 DIAGNOSIS — I714 Abdominal aortic aneurysm, without rupture, unspecified: Secondary | ICD-10-CM | POA: Diagnosis not present

## 2022-12-24 DIAGNOSIS — E1122 Type 2 diabetes mellitus with diabetic chronic kidney disease: Secondary | ICD-10-CM | POA: Diagnosis not present

## 2022-12-24 DIAGNOSIS — R040 Epistaxis: Secondary | ICD-10-CM | POA: Diagnosis not present

## 2022-12-24 DIAGNOSIS — F5101 Primary insomnia: Secondary | ICD-10-CM | POA: Diagnosis not present

## 2022-12-24 DIAGNOSIS — I4892 Unspecified atrial flutter: Secondary | ICD-10-CM | POA: Diagnosis not present

## 2022-12-24 DIAGNOSIS — E291 Testicular hypofunction: Secondary | ICD-10-CM | POA: Diagnosis not present

## 2022-12-24 DIAGNOSIS — Z9989 Dependence on other enabling machines and devices: Secondary | ICD-10-CM | POA: Diagnosis not present

## 2022-12-24 DIAGNOSIS — D649 Anemia, unspecified: Secondary | ICD-10-CM | POA: Diagnosis not present

## 2022-12-24 DIAGNOSIS — I10 Essential (primary) hypertension: Secondary | ICD-10-CM | POA: Diagnosis not present

## 2022-12-24 DIAGNOSIS — E1142 Type 2 diabetes mellitus with diabetic polyneuropathy: Secondary | ICD-10-CM | POA: Diagnosis not present

## 2022-12-28 ENCOUNTER — Ambulatory Visit: Payer: Medicare HMO | Attending: Cardiology | Admitting: Cardiology

## 2022-12-28 ENCOUNTER — Encounter: Payer: Self-pay | Admitting: Cardiology

## 2022-12-28 VITALS — BP 166/74 | HR 75 | Ht 69.5 in | Wt 202.0 lb

## 2022-12-28 DIAGNOSIS — I483 Typical atrial flutter: Secondary | ICD-10-CM

## 2022-12-28 DIAGNOSIS — R040 Epistaxis: Secondary | ICD-10-CM | POA: Diagnosis not present

## 2022-12-28 NOTE — Progress Notes (Signed)
Electrophysiology Office Note:    Date:  12/28/2022   ID:  Gerald Martinez, DOB 04/28/1934, MRN [id number]  [facility] Cardiologist:  Rollene Rotunda, MD  [facility] Electrophysiologist:  Lanier Prude, MD   Referring MD: Rollene Rotunda, MD   Chief Complaint: Atrial flutter  History of Present Illness:    Gerald Martinez is a 89 y.o. malewho I am seeing today for an evaluation of atrial flutter at the request of Dr. Antoine Poche.  The patient was last seen by Dr. Antoine Poche on November 25, 2022.  The patient has a medical history that includes atrial flutter, epistaxis, hypertension, coronary artery disease.  The patient has had recurrent epistaxis making long-term anticoagulation not an ideal stroke risk mitigation strategy.  He exercises 7 days/week.  He is on the treadmill for 30 minutes.  He does lower extremity exercises, resistance training.  He uses a step machine and then does upper extremity weight training.  He has been off his anticoagulant for 3 weeks and has not had any bleeding issues since off the blood thinner.  He has presented to his ENT on 2 occasions for cauterization but they have failed to locate a bleeding blood vessel.  He is willing to go back on short-term anticoagulation in an effort to have the Watchman implanted.      Their past medical, social and family history was reveiwed.   ROS:   Please see the history of present illness.    All other systems reviewed and are negative.  EKGs/Labs/Other Studies Reviewed:    The following studies were reviewed today:  November 25, 2022 ECG shows typical atrial flutter with variable AV block and a ventricular rate of 58 bpm       Physical Exam:    VS:  BP (!) 166/74   Pulse 75   Ht 5' 9.5" (1.765 m)   Wt 202 lb (91.6 kg)   SpO2 98%   BMI 29.40 kg/m     Wt Readings from Last 3 Encounters:  12/28/22 202 lb (91.6 kg)  11/25/22 203 lb (92.1 kg)  01/02/22 198 lb 3.2 oz (89.9 kg)     GEN:  Well nourished,  well developed in no acute distress.  Appears younger than stated age CARDIAC: RRR, no murmurs, rubs, gallops RESPIRATORY:  Clear to auscultation without rales, wheezing or rhonchi       ASSESSMENT AND PLAN:    1. Typical atrial flutter (HCC)   2. Epistaxis     #Atrial flutter Longstanding persistent.  Previously on Eliquis but has had recurrent nosebleeds.  I discussed treatment options with the patient including catheter ablation and left atrial appendage occlusion.  Both strategies would allow long-term avoidance of anticoagulation.  ------------  I have seen Gerald Martinez in the office today who is being considered for a Watchman left atrial appendage closure device. I believe they will benefit from this procedure given their history of atrial fibrillation, CHA2DS2-VASc score of 5 and unadjusted ischemic stroke rate of 7.2% per year. Unfortunately, the patient is not felt to be a long term anticoagulation candidate secondary to recurrent epistaxis. The patient's chart has been reviewed and I feel that they would be a candidate for short term oral anticoagulation after Watchman implant.   It is my belief that after undergoing a LAA closure procedure, Gerald Martinez will not need long term anticoagulation which eliminates anticoagulation side effects and major bleeding risk.   Procedural risks for the Watchman implant have been  reviewed with the patient including a 0.5% risk of stroke, <1% risk of perforation and <1% risk of device embolization. Other risks include bleeding, vascular damage, tamponade, worsening renal function, and death. The patient understands these risk and wishes to proceed.     The published clinical data on the safety and effectiveness of WATCHMAN include but are not limited to the following: - Holmes DR, Everlene Farrier, Sick P et al. for the PROTECT AF Investigators. Percutaneous closure of the left atrial appendage versus warfarin therapy for prevention of stroke in  patients with atrial fibrillation: a randomised non-inferiority trial. Lancet 2009; 374: 534-42. Everlene Farrier, Doshi SK, Isa Rankin D et al. on behalf of the PROTECT AF Investigators. Percutaneous Left Atrial Appendage Closure for Stroke Prophylaxis in Patients With Atrial Fibrillation 2.3-Year Follow-up of the PROTECT AF (Watchman Left Atrial Appendage System for Embolic Protection in Patients With Atrial Fibrillation) Trial. Circulation 2013; 127:720-729. - Alli O, Doshi S,  Kar S, Reddy VY, Sievert H et al. Quality of Life Assessment in the Randomized PROTECT AF (Percutaneous Closure of the Left Atrial Appendage Versus Warfarin Therapy for Prevention of Stroke in Patients With Atrial Fibrillation) Trial of Patients at Risk for Stroke With Nonvalvular Atrial Fibrillation. J Am Coll Cardiol 2013; 61:1790-8. Aline August DR, Mia [place], Price M, Whisenant B, Sievert H, Doshi S, Huber K, Reddy V. Prospective randomized evaluation of the Watchman left atrial appendage Device in patients with atrial fibrillation versus long-term warfarin therapy; the PREVAIL trial. Journal of the [employer] of Cardiology, Vol. 4, No. 1, 2014, 1-11. - Kar S, Doshi SK, Sadhu A, Horton R, Osorio J et al. Primary outcome evaluation of a next-generation left atrial appendage closure device: results from the PINNACLE FLX trial. Circulation 2021;143(18)1754-1762.    After today's visit with the patient which was dedicated solely for shared decision making visit regarding LAA closure device, the patient decided to proceed with the LAA appendage closure procedure scheduled to be done in the near future at [facility]. Prior to the procedure, I would like to obtain a gated CT scan of the chest with contrast timed for PV/LA visualization.   Additionally, the patient will need an updated echocardiogram.  HAS-BLED score 3 Hypertension Yes  Abnormal renal and liver function (Dialysis, transplant, Cr >2.26 mg/dL /Cirrhosis or  Bilirubin >2x Normal or AST/ALT/AP >3x Normal) No  Stroke No  Bleeding Yes  Labile INR (Unstable/high INR) No  Elderly (>65) Yes  Drugs or alcohol (? 8 drinks/week, anti-plt or NSAID) No   CHA2DS2-VASc Score = 5  The patient's score is based upon: CHF History: 0 HTN History: 1 Diabetes History: 1 Stroke History: 0 Vascular Disease History: 1 Age Score: 2 Gender Score: 0   He will start his Eliquis 2 weeks prior to the scheduled watchman implant with plans to continue it for 45 days following implant.  He understands there is increased risk of bleeding while on the blood thinner.   Signed, Rossie Muskrat. Lalla Brothers, MD, [facility] 12/28/2022 3:10 PM    Electrophysiology [facility]

## 2022-12-28 NOTE — Patient Instructions (Signed)
Medication Instructions:  Your physician recommends that you continue on your current medications as directed. Please refer to the Current Medication list given to you today.  *If you need a refill on your cardiac medications before your next appointment, please call your pharmacy*   Lab Work: TODAY: BMET If you have labs (blood work) drawn today and your tests are completely normal, you will receive your results only by: MyChart Message (if you have MyChart) OR A paper copy in the mail If you have any lab test that is abnormal or we need to change your treatment, we will call you to review the results.   Testing/Procedures: Your physician has requested that you have an echocardiogram. Echocardiography is a painless test that uses sound waves to create images of your heart. It provides your doctor with information about the size and shape of your heart and how well your heart's chambers and valves are working. This procedure takes approximately one hour. There are no restrictions for this procedure. Please do NOT wear cologne, perfume, aftershave, or lotions (deodorant is allowed). Please arrive 15 minutes prior to your appointment time.\  Your physician has requested that you have cardiac CT. Cardiac computed tomography (CT) is a painless test that uses an x-ray machine to take clear, detailed pictures of your heart. For further information please visit [email or web address] Please follow instruction sheet as given.   Follow-Up: At [facility], you and your health needs are our priority.  As part of our continuing mission to provide you with exceptional heart care, we have created designated Provider Care Teams.  These Care Teams include your primary Cardiologist (physician) and Advanced Practice Providers (APPs -  Physician Assistants and Nurse Practitioners) who all work together to provide you with the care you need, when you need it.  Your next appointment:   You will be  contacted by Nurse Navigator, Karsten Fells to schedule your pre-procedure visit and procedure date. If you have any questions she can be reached at [phone].

## 2022-12-30 ENCOUNTER — Telehealth: Payer: Self-pay | Admitting: Cardiology

## 2022-12-30 NOTE — Telephone Encounter (Signed)
Pt c/o medication issue:  1. Name of Medication:   ELIQUIS 5 MG TABS tablet   2. How are you currently taking this medication (dosage and times per day)?   3. Are you having a reaction (difficulty breathing--STAT)?   4. What is your medication issue?    Patient called to report to Dr. Lalla Brothers as requested that he started back on Eliquis on 8/5.

## 2022-12-30 NOTE — Telephone Encounter (Signed)
Patient called stated that he started back on Eliquis 5 mg. He wanted to let you both know this. He states he is having a procedure and needs to be on Eliquis for 14 days. He also would like for you guys to keep his PCP in the loop.

## 2022-12-31 MED ORDER — APIXABAN 5 MG PO TABS: 5.0000 mg | ORAL_TABLET | Freq: Two times a day (BID) | ORAL | Status: AC

## 2022-12-31 NOTE — Telephone Encounter (Signed)
Spoke with the patient and he is aware that he only needs to be on Eliquis for two weeks prior to his procedure. He states that he wanted to go ahead and start taking it so that he could have his procedure done as soon as possible. He is aware of testing that needs to be completed prior to scheduling that will not be completed for another two weeks. He would still like to continue on Eliqius.

## 2023-01-04 ENCOUNTER — Ambulatory Visit (HOSPITAL_COMMUNITY)
Admission: RE | Admit: 2023-01-04 | Discharge: 2023-01-04 | Disposition: A | Discharge status: Home / Self Care | Payer: Medicare HMO | Source: Ambulatory Visit | Attending: Cardiology | Admitting: Cardiology

## 2023-01-04 DIAGNOSIS — I700 Atherosclerosis of aorta: Secondary | ICD-10-CM | POA: Diagnosis not present

## 2023-01-04 DIAGNOSIS — I483 Typical atrial flutter: Secondary | ICD-10-CM | POA: Diagnosis not present

## 2023-01-04 DIAGNOSIS — I2510 Atherosclerotic heart disease of native coronary artery without angina pectoris: Secondary | ICD-10-CM | POA: Insufficient documentation

## 2023-01-04 DIAGNOSIS — R040 Epistaxis: Secondary | ICD-10-CM | POA: Insufficient documentation

## 2023-01-04 MED ORDER — IOHEXOL 350 MG/ML SOLN
100.0000 mL | Freq: Once | INTRAVENOUS | Status: AC | PRN
  Administered 2023-01-04: 100 mL via INTRAVENOUS

## 2023-01-05 ENCOUNTER — Ambulatory Visit: Payer: Medicare HMO | Admitting: Cardiology

## 2023-01-07 ENCOUNTER — Ambulatory Visit: Payer: Medicare HMO | Admitting: Podiatry

## 2023-01-07 ENCOUNTER — Encounter: Payer: Self-pay | Admitting: Podiatry

## 2023-01-07 DIAGNOSIS — B351 Tinea unguium: Secondary | ICD-10-CM

## 2023-01-07 DIAGNOSIS — E1140 Type 2 diabetes mellitus with diabetic neuropathy, unspecified: Secondary | ICD-10-CM | POA: Diagnosis not present

## 2023-01-07 DIAGNOSIS — M79676 Pain in unspecified toe(s): Secondary | ICD-10-CM

## 2023-01-07 NOTE — Progress Notes (Signed)
This patient returns to my office for at risk foot care.  This patient requires this care by a professional since this patient will be at risk due to having diabetes and coagulation defect.. Patient is taking eliquis.   This patient is unable to cut nails himself since the patient cannot reach his nails.These nails are painful walking and wearing shoes.  This patient presents for at risk foot care today.  General Appearance  Alert, conversant and in no acute stress.  Vascular  Dorsalis pedis  are  weaklypalpable  bilaterally.  Posterior tibial pulses are absent  B/L. Capillary return is within normal limits  bilaterally. Cold feet Bilaterally. Venous stasis feet/legs  B/L.  Absent digital hair.   Venous congestion feet/toes  B/L.  Neurologic  Senn-Weinstein monofilament wire test diminished   bilaterally. Muscle power within normal limits bilaterally.  Nails Thick disfigured discolored nails with subungual debris  from hallux to fifth toes bilaterally. No evidence of bacterial infection or drainage bilaterally.  Orthopedic  No limitations of motion  feet .  No crepitus or effusions noted.  Hammer toes  B/l.  Skin  normotropic skin with no porokeratosis noted bilaterally.  No signs of infections or ulcers noted.     Onychomycosis  Pain in right toes  Pain in left toes  Consent was obtained for treatment procedures.   Mechanical debridement of nails 1-5  bilaterally performed with a nail nipper.  Filed with dremel without incident. Self treating skin lesion right leg.   Return office visit    4 months                 Told patient to return for periodic foot care and evaluation due to potential at risk complications.   Helane Gunther DPM

## 2023-01-11 ENCOUNTER — Telehealth: Payer: Self-pay

## 2023-01-14 ENCOUNTER — Ambulatory Visit (HOSPITAL_COMMUNITY): Payer: Medicare HMO | Attending: Cardiology

## 2023-01-14 DIAGNOSIS — I483 Typical atrial flutter: Secondary | ICD-10-CM | POA: Diagnosis not present

## 2023-01-14 DIAGNOSIS — R040 Epistaxis: Secondary | ICD-10-CM

## 2023-01-14 LAB — ECHOCARDIOGRAM COMPLETE: S' Lateral: 2.8 cm

## 2023-01-14 MED ORDER — PERFLUTREN LIPID MICROSPHERE
1.0000 mL | INTRAVENOUS | Status: AC | PRN
  Administered 2023-01-14: 2 mL via INTRAVENOUS

## 2023-01-18 ENCOUNTER — Other Ambulatory Visit: Payer: Self-pay

## 2023-01-18 DIAGNOSIS — I483 Typical atrial flutter: Secondary | ICD-10-CM

## 2023-01-18 DIAGNOSIS — R040 Epistaxis: Secondary | ICD-10-CM

## 2023-01-27 ENCOUNTER — Telehealth: Payer: Self-pay

## 2023-02-17 ENCOUNTER — Ambulatory Visit: Payer: Medicare HMO | Attending: Cardiology | Admitting: Cardiology

## 2023-02-17 ENCOUNTER — Ambulatory Visit: Payer: Medicare HMO

## 2023-02-17 VITALS — BP 128/70 | HR 56 | Ht 69.5 in | Wt 205.0 lb

## 2023-02-17 DIAGNOSIS — R040 Epistaxis: Secondary | ICD-10-CM

## 2023-02-17 DIAGNOSIS — I482 Chronic atrial fibrillation, unspecified: Secondary | ICD-10-CM | POA: Diagnosis not present

## 2023-02-17 DIAGNOSIS — I10 Essential (primary) hypertension: Secondary | ICD-10-CM

## 2023-02-17 DIAGNOSIS — Z01812 Encounter for preprocedural laboratory examination: Secondary | ICD-10-CM | POA: Diagnosis not present

## 2023-02-17 NOTE — Patient Instructions (Signed)
Medication Instructions:   Your physician recommends that you continue on your current medications as directed. Please refer to the Current Medication list given to you today.  *If you need a refill on your cardiac medications before your next appointment, please call your pharmacy*   Lab Work:  TODAY--BMET AND CBC W DIFF  If you have labs (blood work) drawn today and your tests are completely normal, you will receive your results only by: MyChart Message (if you have MyChart) OR A paper copy in the mail If you have any lab test that is abnormal or we need to change your treatment, we will call you to review the results.    Follow-Up:  JILL MCDANIEL NP AND STRUCTURAL TEAM WILL REACH OUT TO YOU POST-PROCEDURE TO ARRANGE YOUR FOLLOW-UP APPOINTMENTS WITH OUR OFFICE AND/OR STRUCTURAL CLINIC

## 2023-02-17 NOTE — Progress Notes (Signed)
COMPLETE 01/14/2023  Narrative ECHOCARDIOGRAM REPORT    Patient Name:   Gerald Martinez Allston Date of Exam: 01/14/2023 Medical Rec #:  [id number]     Height:       69.5 in Accession #:    [id number]    Weight:       202.0 lb Date of Birth:  22-Apr-1934     BSA:          2.085 m Patient Age:    89 years      BP:           166/74 mmHg Patient Gender: M             HR:           73 bpm. Exam Location:  [address]  Procedure: 2D Echo, Cardiac Doppler, Color Doppler and Intracardiac Opacification Agent  Indications:    I48.91* Unspecified atrial fibrillation; Watchman evaluation  History:        Patient has prior history of Echocardiogram examinations, most recent 11/30/2005. CAD, Arrythmias:Atrial Flutter, Signs/Symptoms:Fatigue; Risk Factors:Hypertension, Diabetes and Dyslipidemia. Coronary atherosclerosis.  Sonographer:    Cathie Beams RCS Referring Phys: [id number] Rossie Muskrat LAMBERT  IMPRESSIONS   1. Left ventricular ejection fraction, by estimation, is 55 to 60%. The left ventricle has normal function. The left ventricle has no regional wall motion abnormalities. There is mild concentric left ventricular hypertrophy. Left ventricular diastolic parameters are indeterminate. 2. Right ventricular systolic function is moderately reduced. The right ventricular size is mildly enlarged. 3. The mitral valve is normal in structure. Trivial mitral valve regurgitation. No evidence of mitral stenosis. 4. Tricuspid valve regurgitation is mild to moderate. 5. The aortic valve is normal in structure. Aortic valve regurgitation is not visualized. No aortic stenosis is present. 6. The inferior vena cava is normal in size with greater than 50% respiratory variability, suggesting right  atrial pressure of 3 mmHg.  FINDINGS Left Ventricle: Left ventricular ejection fraction, by estimation, is 55 to 60%. The left ventricle has normal function. The left ventricle has no regional wall motion abnormalities. The left ventricular internal cavity size was normal in size. There is mild concentric left ventricular hypertrophy. Left ventricular diastolic parameters are indeterminate.  Right Ventricle: The right ventricular size is mildly enlarged. No increase in right ventricular wall thickness. Right ventricular systolic function is moderately reduced.  Left Atrium: Left atrial size was normal in size.  Right Atrium: Right atrial size was normal in size.  Pericardium: There is no evidence of pericardial effusion.  Mitral Valve: The mitral valve is normal in structure. Trivial mitral valve regurgitation. No evidence of mitral valve stenosis.  Tricuspid Valve: The tricuspid valve is normal in structure. Tricuspid valve regurgitation is mild to moderate. No evidence of tricuspid stenosis.  Aortic Valve: The aortic valve is normal in structure. Aortic valve regurgitation is not visualized. No aortic stenosis is present.  Pulmonic Valve: The pulmonic valve was normal in structure. Pulmonic valve regurgitation is trivial. No evidence of pulmonic stenosis.  Aorta: The aortic root is normal in size and structure.  Venous: The inferior vena cava is normal in size with greater than 50% respiratory variability, suggesting right atrial pressure of 3 mmHg.  IAS/Shunts: No atrial level shunt detected by color flow Doppler.   LEFT VENTRICLE PLAX 2D LVIDd:         4.40 cm LVIDs:         2.80 cm LV PW:         1.00 cm  COMPLETE 01/14/2023  Narrative ECHOCARDIOGRAM REPORT    Patient Name:   Gerald Martinez Allston Date of Exam: 01/14/2023 Medical Rec #:  [id number]     Height:       69.5 in Accession #:    [id number]    Weight:       202.0 lb Date of Birth:  22-Apr-1934     BSA:          2.085 m Patient Age:    89 years      BP:           166/74 mmHg Patient Gender: M             HR:           73 bpm. Exam Location:  [address]  Procedure: 2D Echo, Cardiac Doppler, Color Doppler and Intracardiac Opacification Agent  Indications:    I48.91* Unspecified atrial fibrillation; Watchman evaluation  History:        Patient has prior history of Echocardiogram examinations, most recent 11/30/2005. CAD, Arrythmias:Atrial Flutter, Signs/Symptoms:Fatigue; Risk Factors:Hypertension, Diabetes and Dyslipidemia. Coronary atherosclerosis.  Sonographer:    Cathie Beams RCS Referring Phys: [id number] Rossie Muskrat LAMBERT  IMPRESSIONS   1. Left ventricular ejection fraction, by estimation, is 55 to 60%. The left ventricle has normal function. The left ventricle has no regional wall motion abnormalities. There is mild concentric left ventricular hypertrophy. Left ventricular diastolic parameters are indeterminate. 2. Right ventricular systolic function is moderately reduced. The right ventricular size is mildly enlarged. 3. The mitral valve is normal in structure. Trivial mitral valve regurgitation. No evidence of mitral stenosis. 4. Tricuspid valve regurgitation is mild to moderate. 5. The aortic valve is normal in structure. Aortic valve regurgitation is not visualized. No aortic stenosis is present. 6. The inferior vena cava is normal in size with greater than 50% respiratory variability, suggesting right  atrial pressure of 3 mmHg.  FINDINGS Left Ventricle: Left ventricular ejection fraction, by estimation, is 55 to 60%. The left ventricle has normal function. The left ventricle has no regional wall motion abnormalities. The left ventricular internal cavity size was normal in size. There is mild concentric left ventricular hypertrophy. Left ventricular diastolic parameters are indeterminate.  Right Ventricle: The right ventricular size is mildly enlarged. No increase in right ventricular wall thickness. Right ventricular systolic function is moderately reduced.  Left Atrium: Left atrial size was normal in size.  Right Atrium: Right atrial size was normal in size.  Pericardium: There is no evidence of pericardial effusion.  Mitral Valve: The mitral valve is normal in structure. Trivial mitral valve regurgitation. No evidence of mitral valve stenosis.  Tricuspid Valve: The tricuspid valve is normal in structure. Tricuspid valve regurgitation is mild to moderate. No evidence of tricuspid stenosis.  Aortic Valve: The aortic valve is normal in structure. Aortic valve regurgitation is not visualized. No aortic stenosis is present.  Pulmonic Valve: The pulmonic valve was normal in structure. Pulmonic valve regurgitation is trivial. No evidence of pulmonic stenosis.  Aorta: The aortic root is normal in size and structure.  Venous: The inferior vena cava is normal in size with greater than 50% respiratory variability, suggesting right atrial pressure of 3 mmHg.  IAS/Shunts: No atrial level shunt detected by color flow Doppler.   LEFT VENTRICLE PLAX 2D LVIDd:         4.40 cm LVIDs:         2.80 cm LV PW:         1.00 cm  COMPLETE 01/14/2023  Narrative ECHOCARDIOGRAM REPORT    Patient Name:   Gerald Martinez Allston Date of Exam: 01/14/2023 Medical Rec #:  [id number]     Height:       69.5 in Accession #:    [id number]    Weight:       202.0 lb Date of Birth:  22-Apr-1934     BSA:          2.085 m Patient Age:    89 years      BP:           166/74 mmHg Patient Gender: M             HR:           73 bpm. Exam Location:  [address]  Procedure: 2D Echo, Cardiac Doppler, Color Doppler and Intracardiac Opacification Agent  Indications:    I48.91* Unspecified atrial fibrillation; Watchman evaluation  History:        Patient has prior history of Echocardiogram examinations, most recent 11/30/2005. CAD, Arrythmias:Atrial Flutter, Signs/Symptoms:Fatigue; Risk Factors:Hypertension, Diabetes and Dyslipidemia. Coronary atherosclerosis.  Sonographer:    Cathie Beams RCS Referring Phys: [id number] Rossie Muskrat LAMBERT  IMPRESSIONS   1. Left ventricular ejection fraction, by estimation, is 55 to 60%. The left ventricle has normal function. The left ventricle has no regional wall motion abnormalities. There is mild concentric left ventricular hypertrophy. Left ventricular diastolic parameters are indeterminate. 2. Right ventricular systolic function is moderately reduced. The right ventricular size is mildly enlarged. 3. The mitral valve is normal in structure. Trivial mitral valve regurgitation. No evidence of mitral stenosis. 4. Tricuspid valve regurgitation is mild to moderate. 5. The aortic valve is normal in structure. Aortic valve regurgitation is not visualized. No aortic stenosis is present. 6. The inferior vena cava is normal in size with greater than 50% respiratory variability, suggesting right  atrial pressure of 3 mmHg.  FINDINGS Left Ventricle: Left ventricular ejection fraction, by estimation, is 55 to 60%. The left ventricle has normal function. The left ventricle has no regional wall motion abnormalities. The left ventricular internal cavity size was normal in size. There is mild concentric left ventricular hypertrophy. Left ventricular diastolic parameters are indeterminate.  Right Ventricle: The right ventricular size is mildly enlarged. No increase in right ventricular wall thickness. Right ventricular systolic function is moderately reduced.  Left Atrium: Left atrial size was normal in size.  Right Atrium: Right atrial size was normal in size.  Pericardium: There is no evidence of pericardial effusion.  Mitral Valve: The mitral valve is normal in structure. Trivial mitral valve regurgitation. No evidence of mitral valve stenosis.  Tricuspid Valve: The tricuspid valve is normal in structure. Tricuspid valve regurgitation is mild to moderate. No evidence of tricuspid stenosis.  Aortic Valve: The aortic valve is normal in structure. Aortic valve regurgitation is not visualized. No aortic stenosis is present.  Pulmonic Valve: The pulmonic valve was normal in structure. Pulmonic valve regurgitation is trivial. No evidence of pulmonic stenosis.  Aorta: The aortic root is normal in size and structure.  Venous: The inferior vena cava is normal in size with greater than 50% respiratory variability, suggesting right atrial pressure of 3 mmHg.  IAS/Shunts: No atrial level shunt detected by color flow Doppler.   LEFT VENTRICLE PLAX 2D LVIDd:         4.40 cm LVIDs:         2.80 cm LV PW:         1.00 cm  COMPLETE 01/14/2023  Narrative ECHOCARDIOGRAM REPORT    Patient Name:   Gerald Martinez Allston Date of Exam: 01/14/2023 Medical Rec #:  [id number]     Height:       69.5 in Accession #:    [id number]    Weight:       202.0 lb Date of Birth:  22-Apr-1934     BSA:          2.085 m Patient Age:    89 years      BP:           166/74 mmHg Patient Gender: M             HR:           73 bpm. Exam Location:  [address]  Procedure: 2D Echo, Cardiac Doppler, Color Doppler and Intracardiac Opacification Agent  Indications:    I48.91* Unspecified atrial fibrillation; Watchman evaluation  History:        Patient has prior history of Echocardiogram examinations, most recent 11/30/2005. CAD, Arrythmias:Atrial Flutter, Signs/Symptoms:Fatigue; Risk Factors:Hypertension, Diabetes and Dyslipidemia. Coronary atherosclerosis.  Sonographer:    Cathie Beams RCS Referring Phys: [id number] Rossie Muskrat LAMBERT  IMPRESSIONS   1. Left ventricular ejection fraction, by estimation, is 55 to 60%. The left ventricle has normal function. The left ventricle has no regional wall motion abnormalities. There is mild concentric left ventricular hypertrophy. Left ventricular diastolic parameters are indeterminate. 2. Right ventricular systolic function is moderately reduced. The right ventricular size is mildly enlarged. 3. The mitral valve is normal in structure. Trivial mitral valve regurgitation. No evidence of mitral stenosis. 4. Tricuspid valve regurgitation is mild to moderate. 5. The aortic valve is normal in structure. Aortic valve regurgitation is not visualized. No aortic stenosis is present. 6. The inferior vena cava is normal in size with greater than 50% respiratory variability, suggesting right  atrial pressure of 3 mmHg.  FINDINGS Left Ventricle: Left ventricular ejection fraction, by estimation, is 55 to 60%. The left ventricle has normal function. The left ventricle has no regional wall motion abnormalities. The left ventricular internal cavity size was normal in size. There is mild concentric left ventricular hypertrophy. Left ventricular diastolic parameters are indeterminate.  Right Ventricle: The right ventricular size is mildly enlarged. No increase in right ventricular wall thickness. Right ventricular systolic function is moderately reduced.  Left Atrium: Left atrial size was normal in size.  Right Atrium: Right atrial size was normal in size.  Pericardium: There is no evidence of pericardial effusion.  Mitral Valve: The mitral valve is normal in structure. Trivial mitral valve regurgitation. No evidence of mitral valve stenosis.  Tricuspid Valve: The tricuspid valve is normal in structure. Tricuspid valve regurgitation is mild to moderate. No evidence of tricuspid stenosis.  Aortic Valve: The aortic valve is normal in structure. Aortic valve regurgitation is not visualized. No aortic stenosis is present.  Pulmonic Valve: The pulmonic valve was normal in structure. Pulmonic valve regurgitation is trivial. No evidence of pulmonic stenosis.  Aorta: The aortic root is normal in size and structure.  Venous: The inferior vena cava is normal in size with greater than 50% respiratory variability, suggesting right atrial pressure of 3 mmHg.  IAS/Shunts: No atrial level shunt detected by color flow Doppler.   LEFT VENTRICLE PLAX 2D LVIDd:         4.40 cm LVIDs:         2.80 cm LV PW:         1.00 cm  [facility]                                     Cardiology Office Note:    Date:  02/17/2023   ID:  Gerald Martinez, DOB 1933/10/03, MRN [id number]  PCP:  Emilio Aspen, MD  [facility] Cardiologist:  Rollene Rotunda, MD  [facility] Electrophysiologist:  Lanier Prude, MD   Referring MD: Emilio Aspen, *   Chief Complaint  Patient presents with   Pre-op Exam   History of Present Illness:    Gerald Martinez is a 89 y.o. male with a hx of atrial flutter, recurrent epistaxis secondary to anticoagulation, HTN, and CAD who was referred to Dr. Lalla Brothers for consideration of LAAO closure with Watchman.   Mr. Baise is followed by Dr. Antoine Poche for his cardiology care. He exercises 7 days/week on the treadmill along with resistance training which aggravates the issues with his nose bleeds. At the time of consult, he has been off [facility] for three weeks with no recurrences. He has presented to his ENT on 2 occasions for cauterization but they have failed to locate a bleeding blood vessel.  He is willing to go back on short-term anticoagulation in an effort to have the Watchman implanted.  He was felt to be a good candidate and underwent pre Watchman imaging which showed anatomy suitable to proceed.   He is here today alone and reports he has been doing well and is ready for his procedure. He denies chest pain, SOB, palpitations, LE edema, orthopnea, PND, dizziness, or syncope. Denies bleeding in stool or urine.    Past Medical History:  Diagnosis Date   AAA (abdominal aortic aneurysm) (HCC)    Atrial flutter (HCC)    Barrett's esophagus    BPH (benign prostatic hypertrophy)    CAD (coronary artery disease)    Post CABG in 2000.  LIMA to the LAD, SVG to OM, SVG to PDA, last perfusion study in 2012 with no high risk findings   Cataract    Chronic kidney disease    Colon polyps    Diabetes mellitus    Type II   Dyslipidemia    Esophageal cancer (HCC)    GERD  (gastroesophageal reflux disease)    Hearing loss    Hypertension    Iron deficiency anemia    Neuropathy    Obesity     Past Surgical History:  Procedure Laterality Date   ARTERIAL ANEURYSM REPAIR     CATARACT EXTRACTION     CORONARY ARTERY BYPASS GRAFT  2000   ear drum surgery Right    ESOPHAGECTOMY  2000   LUMBAR DISC ARTHROPLASTY  1980   VENTRAL HERNIA REPAIR  2001    Current Medications: Current Meds  Medication Sig   apixaban (ELIQUIS) 5 MG TABS tablet Take 1 tablet (5 mg total) by mouth 2 (two) times daily.   Blood Glucose Monitoring Suppl (ONE TOUCH ULTRA 2) w/Device KIT CHECK SUGAR ONCE DAILY   doxazosin (CARDURA) 4 MG tablet TAKE 1 TABLET BY MOUTH DAILY.   gabapentin (NEURONTIN) 100 MG capsule    glucose blood (ONETOUCH ULTRA) test strip Check sugar once daily DX E11.59   losartan (COZAAR) 50 MG tablet    Melatonin 10 MG TABS Take 1 tablet by mouth as needed.   metFORMIN (GLUCOPHAGE-XR) 500 MG 24 hr tablet Take 2 tablets in

## 2023-02-18 LAB — CBC WITH DIFFERENTIAL/PLATELET
Basophils Absolute: 0 10*3/uL (ref 0.0–0.2)
Basos: 0 %
EOS (ABSOLUTE): 0.1 10*3/uL (ref 0.0–0.4)
Eos: 1 %
Hematocrit: 42.1 % (ref 37.5–51.0)
Hemoglobin: 13.1 g/dL (ref 13.0–17.7)
Immature Grans (Abs): 0 10*3/uL (ref 0.0–0.1)
Immature Granulocytes: 0 %
Lymphocytes Absolute: 1.3 10*3/uL (ref 0.7–3.1)
Lymphs: 26 %
MCH: 29.5 pg (ref 26.6–33.0)
MCHC: 31.1 g/dL — ABNORMAL LOW (ref 31.5–35.7)
MCV: 95 fL (ref 79–97)
Monocytes Absolute: 0.6 10*3/uL (ref 0.1–0.9)
Monocytes: 11 %
Neutrophils Absolute: 3.1 10*3/uL (ref 1.4–7.0)
Neutrophils: 62 %
Platelets: 130 10*3/uL — ABNORMAL LOW (ref 150–450)
RBC: 4.44 x10E6/uL (ref 4.14–5.80)
RDW: 13.8 % (ref 11.6–15.4)
WBC: 5.1 10*3/uL (ref 3.4–10.8)

## 2023-02-18 LAB — BASIC METABOLIC PANEL
BUN/Creatinine Ratio: 19 (ref 10–24)
BUN: 17 mg/dL (ref 8–27)
CO2: 24 mmol/L (ref 20–29)
Calcium: 9.8 mg/dL (ref 8.6–10.2)
Chloride: 100 mmol/L (ref 96–106)
Creatinine, Ser: 0.9 mg/dL (ref 0.76–1.27)
Glucose: 112 mg/dL — ABNORMAL HIGH (ref 70–99)
Potassium: 4.5 mmol/L (ref 3.5–5.2)
Sodium: 138 mmol/L (ref 134–144)
eGFR: 83 mL/min/{1.73_m2} (ref >59)

## 2023-03-02 ENCOUNTER — Telehealth: Payer: Self-pay

## 2023-03-02 NOTE — Telephone Encounter (Signed)
Confirmed procedure date of 03/04/2023. Confirmed arrival time of 0530 for procedure time at 0730. Reviewed pre-procedure instructions with patient. He has been taking Eliquis as directed with no complications since restarting. The patient understands to call if questions/concerns arise prior to procedure.

## 2023-03-02 NOTE — Telephone Encounter (Signed)
Called to review Watchman procedure details and to confirm the patient has been taking Eliquis as directed with no issues.  Left message to call back

## 2023-03-03 NOTE — Progress Notes (Signed)
PCP - Emilio Aspen, Cardiologist - Rollene Rotunda, MD  Electrophysiologist:  Lanier Prude, MD   PPM/ICD -  Device Orders -  Rep Notified -   Chest x-ray -  EKG - 02-17-23 Stress Test -  ECHO - 01-14-23 Cardiac Cath -   CPAP -   DM- follow instructions given to you by your surgeon  Blood Thinner Instructions: Follow instructions given to you by your surgeon Aspirin Instructions:  ERAS Protcol - NPO  COVID TEST- n/a  Anesthesia review: no  Patient verbally denies any shortness of breath, fever, cough and chest pain during phone call   -------------  SDW INSTRUCTIONS given:  Your procedure is scheduled on March 04, 2023.  Report to [facility] Main Entrance "A" at 5:30 A.M., and check in at the Admitting office.  Call this number if you have problems the morning of surgery:  [phone] Follow instructions given to you by  your surgeon

## 2023-03-04 ENCOUNTER — Encounter (HOSPITAL_COMMUNITY)
Admission: RE | Disposition: A | Discharge status: Home / Self Care | Payer: Self-pay | Source: Home / Self Care | Attending: Cardiology

## 2023-03-04 ENCOUNTER — Ambulatory Visit (HOSPITAL_COMMUNITY)
Admission: RE | Admit: 2023-03-04 | Discharge: 2023-03-04 | Disposition: A | Discharge status: Home / Self Care | Payer: Medicare HMO | Attending: Cardiology | Admitting: Cardiology

## 2023-03-04 ENCOUNTER — Inpatient Hospital Stay (HOSPITAL_COMMUNITY): Payer: Medicare HMO

## 2023-03-04 ENCOUNTER — Encounter (HOSPITAL_COMMUNITY): Payer: Self-pay | Admitting: Cardiology

## 2023-03-04 ENCOUNTER — Other Ambulatory Visit: Payer: Self-pay

## 2023-03-04 ENCOUNTER — Inpatient Hospital Stay (HOSPITAL_BASED_OUTPATIENT_CLINIC_OR_DEPARTMENT_OTHER): Payer: Medicare HMO

## 2023-03-04 DIAGNOSIS — I483 Typical atrial flutter: Secondary | ICD-10-CM | POA: Diagnosis present

## 2023-03-04 DIAGNOSIS — I2510 Atherosclerotic heart disease of native coronary artery without angina pectoris: Secondary | ICD-10-CM | POA: Insufficient documentation

## 2023-03-04 DIAGNOSIS — I10 Essential (primary) hypertension: Secondary | ICD-10-CM | POA: Diagnosis not present

## 2023-03-04 DIAGNOSIS — I4891 Unspecified atrial fibrillation: Secondary | ICD-10-CM

## 2023-03-04 DIAGNOSIS — R040 Epistaxis: Secondary | ICD-10-CM | POA: Insufficient documentation

## 2023-03-04 DIAGNOSIS — R918 Other nonspecific abnormal finding of lung field: Secondary | ICD-10-CM | POA: Diagnosis not present

## 2023-03-04 DIAGNOSIS — I517 Cardiomegaly: Secondary | ICD-10-CM | POA: Diagnosis not present

## 2023-03-04 DIAGNOSIS — Z951 Presence of aortocoronary bypass graft: Secondary | ICD-10-CM | POA: Diagnosis not present

## 2023-03-04 DIAGNOSIS — Z01818 Encounter for other preprocedural examination: Secondary | ICD-10-CM | POA: Diagnosis not present

## 2023-03-04 HISTORY — PX: TEE WITHOUT CARDIOVERSION: SHX5443

## 2023-03-04 HISTORY — PX: LEFT ATRIAL APPENDAGE OCCLUSION: EP1229

## 2023-03-04 LAB — GLUCOSE, CAPILLARY
Glucose-Capillary: 134 mg/dL — ABNORMAL HIGH (ref 70–99)
Glucose-Capillary: 135 mg/dL — ABNORMAL HIGH (ref 70–99)

## 2023-03-04 LAB — TYPE AND SCREEN
ABO/RH(D): B NEG
Antibody Screen: NEGATIVE

## 2023-03-04 LAB — SURGICAL PCR SCREEN
MRSA, PCR: NEGATIVE
Staphylococcus aureus: NEGATIVE

## 2023-03-04 LAB — ABO/RH: ABO/RH(D): B NEG

## 2023-03-04 LAB — ECHO TEE

## 2023-03-04 SURGERY — LEFT ATRIAL APPENDAGE OCCLUSION
Anesthesia: General

## 2023-03-04 MED ORDER — LACTATED RINGERS IV SOLN: INTRAVENOUS | Status: DC

## 2023-03-04 MED ORDER — FENTANYL CITRATE (PF) 100 MCG/2ML IJ SOLN
INTRAMUSCULAR | Status: AC
  Filled 2023-03-04: qty 2

## 2023-03-04 MED ORDER — SODIUM CHLORIDE 0.9 % IV SOLN: INTRAVENOUS | Status: DC

## 2023-03-04 MED ORDER — HEPARIN (PORCINE) IN NACL 2000-0.9 UNIT/L-% IV SOLN
INTRAVENOUS | Status: DC | PRN
  Administered 2023-03-04: 1000 mL

## 2023-03-04 MED ORDER — SUGAMMADEX SODIUM 200 MG/2ML IV SOLN
INTRAVENOUS | Status: DC | PRN
  Administered 2023-03-04: 400 mg via INTRAVENOUS

## 2023-03-04 MED ORDER — CHLORHEXIDINE GLUCONATE 0.12 % MT SOLN
15.0000 mL | Freq: Once | OROMUCOSAL | Status: AC
  Filled 2023-03-04: qty 15

## 2023-03-04 MED ORDER — CHLORHEXIDINE GLUCONATE 4 % EX SOLN: Freq: Once | CUTANEOUS | Status: DC

## 2023-03-04 MED ORDER — INSULIN ASPART 100 UNIT/ML IJ SOLN: 0.0000 [IU] | INTRAMUSCULAR | Status: DC | PRN

## 2023-03-04 MED ORDER — CHLORHEXIDINE GLUCONATE 0.12 % MT SOLN
OROMUCOSAL | Status: AC
  Administered 2023-03-04: 15 mL via OROMUCOSAL
  Filled 2023-03-04: qty 15

## 2023-03-04 MED ORDER — DEXAMETHASONE SODIUM PHOSPHATE 10 MG/ML IJ SOLN
INTRAMUSCULAR | Status: DC | PRN
  Administered 2023-03-04: 10 mg via INTRAVENOUS

## 2023-03-04 MED ORDER — CEFAZOLIN SODIUM-DEXTROSE 2-4 GM/100ML-% IV SOLN
2.0000 g | INTRAVENOUS | Status: AC
  Administered 2023-03-04: 2 g via INTRAVENOUS
  Filled 2023-03-04: qty 100

## 2023-03-04 MED ORDER — ROCURONIUM BROMIDE 10 MG/ML (PF) SYRINGE
PREFILLED_SYRINGE | INTRAVENOUS | Status: DC | PRN
  Administered 2023-03-04: 60 mg via INTRAVENOUS

## 2023-03-04 MED ORDER — LIDOCAINE 2% (20 MG/ML) 5 ML SYRINGE
INTRAMUSCULAR | Status: DC | PRN
  Administered 2023-03-04: 60 mg via INTRAVENOUS

## 2023-03-04 MED ORDER — PROPOFOL 10 MG/ML IV BOLUS
INTRAVENOUS | Status: DC | PRN
  Administered 2023-03-04: 40 mg via INTRAVENOUS
  Administered 2023-03-04: 70 mg via INTRAVENOUS

## 2023-03-04 MED ORDER — HEPARIN SODIUM (PORCINE) 1000 UNIT/ML IJ SOLN
INTRAMUSCULAR | Status: AC
  Filled 2023-03-04: qty 10

## 2023-03-04 MED ORDER — ONDANSETRON HCL 4 MG/2ML IJ SOLN
INTRAMUSCULAR | Status: DC | PRN
  Administered 2023-03-04: 4 mg via INTRAVENOUS

## 2023-03-04 MED ORDER — HEPARIN (PORCINE) IN NACL 1000-0.9 UT/500ML-% IV SOLN
INTRAVENOUS | Status: DC | PRN
  Administered 2023-03-04: 500 mL

## 2023-03-04 MED ORDER — FENTANYL CITRATE (PF) 250 MCG/5ML IJ SOLN
INTRAMUSCULAR | Status: DC | PRN
  Administered 2023-03-04: 50 ug via INTRAVENOUS

## 2023-03-04 MED ORDER — PROPOFOL 500 MG/50ML IV EMUL
INTRAVENOUS | Status: DC | PRN
Start: 2023-03-04 — End: 2023-03-04
  Administered 2023-03-04: 50 ug/kg/min via INTRAVENOUS

## 2023-03-04 SURGICAL SUPPLY — 10 items
CATH DIAG 6FR PIGTAIL ANGLED (CATHETERS) IMPLANT
CLOSURE PERCLOSE PROSTYLE (VASCULAR PRODUCTS) IMPLANT
DILATOR VESSEL 38 20CM 12FR (INTRODUCER) IMPLANT
KIT SHEA VERSACROSS LAAC CONNE (KITS) IMPLANT
PACK CARDIAC CATHETERIZATION (CUSTOM PROCEDURE TRAY) ×2 IMPLANT
PAD DEFIB RADIO PHYSIO CONN (PAD) ×2 IMPLANT
SHEATH PERFORMER 16FR 30 (SHEATH) IMPLANT
SHEATH PINNACLE 8F 10CM (SHEATH) IMPLANT
SHEATH PROBE COVER 6X72 (BAG) ×2 IMPLANT
TRANSDUCER W/STOPCOCK (MISCELLANEOUS) ×2 IMPLANT

## 2023-03-04 NOTE — Anesthesia Preprocedure Evaluation (Signed)
Anesthesia Evaluation  Patient identified by MRN, date of birth, ID band Patient awake    Reviewed: Allergy & Precautions, NPO status , Patient's Chart, lab work & pertinent test results  History of Anesthesia Complications Negative for: history of anesthetic complications  Airway Mallampati: IV  TM Distance: >3 FB Neck ROM: Limited  Mouth opening: Limited Mouth Opening  Dental  (+) Teeth Intact, Dental Advisory Given   Pulmonary neg shortness of breath, neg sleep apnea, neg COPD, neg recent URI, former smoker   breath sounds clear to auscultation       Cardiovascular hypertension, Pt. on medications + CAD and + CABG  + dysrhythmias Atrial Fibrillation  Rhythm:Regular     Neuro/Psych negative neurological ROS  negative psych ROS   GI/Hepatic ,GERD  ,,  Endo/Other  diabetes  Lab Results      Component                Value               Date                      HGBA1C                   6.6 (A)             12/23/2020             Renal/GU Renal diseaseLab Results      Component                Value               Date                      CREATININE               0.90                02/17/2023            Lab Results      Component                Value               Date                      NA                       138                 02/17/2023                K                        4.5                 02/17/2023                CO2                      24                  02/17/2023                GLUCOSE                  112 (H)  02/17/2023                BUN                      17                  02/17/2023                CREATININE               0.90                02/17/2023                CALCIUM                  9.8                 02/17/2023                GFR                      63.03               09/17/2020                EGFR                     83                  02/17/2023                 GFRNONAA                 >60                 02/27/2015                Musculoskeletal   Abdominal   Peds  Hematology  (+) Blood dyscrasia Lab Results      Component                Value               Date                      WBC                      5.1                 02/17/2023                HGB                      13.1                02/17/2023                HCT                      42.1                02/17/2023                MCV                      95  02/17/2023                PLT                      130 (L)             02/17/2023             eliquis   Anesthesia Other Findings   Reproductive/Obstetrics                             Anesthesia Physical Anesthesia Plan  ASA: 3  Anesthesia Plan: General   Post-op Pain Management: Minimal or no pain anticipated   Induction: Intravenous  PONV Risk Score and Plan: 2 and Ondansetron, Dexamethasone, Propofol infusion and TIVA  Airway Management Planned: Oral ETT and Video Laryngoscope Planned  Additional Equipment: None  Intra-op Plan:   Post-operative Plan: Extubation in OR  Informed Consent: I have reviewed the patients History and Physical, chart, labs and discussed the procedure including the risks, benefits and alternatives for the proposed anesthesia with the patient or authorized representative who has indicated his/her understanding and acceptance.     Dental advisory given  Plan Discussed with: CRNA  Anesthesia Plan Comments:        Anesthesia Quick Evaluation

## 2023-03-04 NOTE — Anesthesia Procedure Notes (Signed)
Procedure Name: Intubation Date/Time: 03/04/2023 7:59 AM  Performed by: Vena Austria, CRNAPre-anesthesia Checklist: Patient identified, Emergency Drugs available, Suction available, Patient being monitored and Timeout performed Patient Re-evaluated:Patient Re-evaluated prior to induction Oxygen Delivery Method: Circle system utilized Preoxygenation: Pre-oxygenation with 100% oxygen Induction Type: IV induction Ventilation: Mask ventilation without difficulty Laryngoscope Size: McGraph and 3 Grade View: Grade II Tube type: Oral Tube size: 7.0 mm Number of attempts: 1 Airway Equipment and Method: Stylet and Video-laryngoscopy Placement Confirmation: ETT inserted through vocal cords under direct vision, positive ETCO2, CO2 detector and breath sounds checked- equal and bilateral Secured at: 21 cm Tube secured with: Tape Dental Injury: Teeth and Oropharynx as per pre-operative assessment  Comments: GREAT [facility]IEW, recommend Stiff Stylet for intubation, video due to neck stiffness

## 2023-03-04 NOTE — H&P (Signed)
Electrophysiology Office Note:     Date:  03/04/2023    ID:  [name], DOB Dec 29, 1933, MRN [id number]   [facility] Cardiologist:  Rollene Rotunda, MD  [facility] Electrophysiologist:  Lanier Prude, MD    Referring MD: Rollene Rotunda, MD    Chief Complaint: Atrial flutter   History of Present Illness:     Gerald Martinez is a 89 y.o. malewho I am seeing today for an evaluation of atrial flutter at the request of Dr. Antoine Poche.   The patient was last seen by Dr. Antoine Poche on November 25, 2022.   The patient has a medical history that includes atrial flutter, epistaxis, hypertension, coronary artery disease.   The patient has had recurrent epistaxis making long-term anticoagulation not an ideal stroke risk mitigation strategy.   He exercises 7 days/week.  He is on the treadmill for 30 minutes.  He does lower extremity exercises, resistance training.  He uses a step machine and then does upper extremity weight training.  He has been off his anticoagulant for 3 weeks and has not had any bleeding issues since off the blood thinner.  He has presented to his ENT on 2 occasions for cauterization but they have failed to locate a bleeding blood vessel.  He is willing to go back on short-term anticoagulation in an effort to have the Watchman implanted.    Presents for [employer] implant today. Procedure reviewed. He started his anticoagulant and has tolerated it thus far without evidence of bleeding.   Objective Their past medical, social and family history was reveiwed.     ROS:   Please see the history of present illness.    All other systems reviewed and are negative.   EKGs/Labs/Other Studies Reviewed:     The following studies were reviewed today:   November 25, 2022 ECG shows typical atrial flutter with variable AV block and a ventricular rate of 58 bpm          Physical Exam:     VS:  BP 166/68   Pulse 75   Ht 5' 9.5" (1.765 m)   Wt 202 lb (91.6 kg)   SpO2 98%   BMI  29.40 kg/m         Wt Readings from Last 3 Encounters:  12/28/22 202 lb (91.6 kg)  11/25/22 203 lb (92.1 kg)  01/02/22 198 lb 3.2 oz (89.9 kg)      GEN:  Well nourished, well developed in no acute distress.  Appears younger than stated age CARDIAC: RRR, no murmurs, rubs, gallops RESPIRATORY:  Clear to auscultation without rales, wheezing or rhonchi          Assessment ASSESSMENT AND PLAN:     1. Typical atrial flutter (HCC)   2. Epistaxis       #Atrial flutter Longstanding persistent.  Previously on Eliquis but has had recurrent nosebleeds.  I discussed treatment options with the patient including catheter ablation and left atrial appendage occlusion.  Both strategies would allow long-term avoidance of anticoagulation.   ------------   I have seen Gerald Martinez Lady in the office today who is being considered for a Watchman left atrial appendage closure device. I believe they will benefit from this procedure given their history of atrial fibrillation, CHA2DS2-VASc score of 5 and unadjusted ischemic stroke rate of 7.2% per year. Unfortunately, the patient is not felt to be a long term anticoagulation candidate secondary to recurrent epistaxis. The patient's chart has been reviewed and I feel  that they would be a candidate for short term oral anticoagulation after Watchman implant.    It is my belief that after undergoing a LAA closure procedure, Gerald Martinez will not need long term anticoagulation which eliminates anticoagulation side effects and major bleeding risk.    Procedural risks for the Watchman implant have been reviewed with the patient including a 0.5% risk of stroke, <1% risk of perforation and <1% risk of device embolization. Other risks include bleeding, vascular damage, tamponade, worsening renal function, and death. The patient understands these risk and wishes to proceed.       The published clinical data on the safety and effectiveness of WATCHMAN include but are not  limited to the following: - Holmes DR, Everlene Farrier, Sick P et al. for the PROTECT AF Investigators. Percutaneous closure of the left atrial appendage versus warfarin therapy for prevention of stroke in patients with atrial fibrillation: a randomised non-inferiority trial. Lancet 2009; 374: 534-42. Everlene Farrier, Doshi SK, Isa Rankin D et al. on behalf of the PROTECT AF Investigators. Percutaneous Left Atrial Appendage Closure for Stroke Prophylaxis in Patients With Atrial Fibrillation 2.3-Year Follow-up of the PROTECT AF (Watchman Left Atrial Appendage System for Embolic Protection in Patients With Atrial Fibrillation) Trial. Circulation 2013; 127:720-729. - Alli O, Doshi S,  Kar S, Reddy VY, Sievert H et al. Quality of Life Assessment in the Randomized PROTECT AF (Percutaneous Closure of the Left Atrial Appendage Versus Warfarin Therapy for Prevention of Stroke in Patients With Atrial Fibrillation) Trial of Patients at Risk for Stroke With Nonvalvular Atrial Fibrillation. J Am Coll Cardiol 2013; 61:1790-8. Aline August DR, Mia [place], Price M, Whisenant B, Sievert H, Doshi S, Huber K, Reddy V. Prospective randomized evaluation of the Watchman left atrial appendage Device in patients with atrial fibrillation versus long-term warfarin therapy; the PREVAIL trial. Journal of the [employer] of Cardiology, Vol. 4, No. 1, 2014, 1-11. - Kar S, Doshi SK, Sadhu A, Horton R, Osorio J et al. Primary outcome evaluation of a next-generation left atrial appendage closure device: results from the PINNACLE FLX trial. Circulation 2021;143(18)1754-1762.      After today's visit with the patient which was dedicated solely for shared decision making visit regarding LAA closure device, the patient decided to proceed with the LAA appendage closure procedure scheduled to be done in the near future at [facility]. Prior to the procedure, I would like to obtain a gated CT scan of the chest with contrast timed for PV/LA  visualization.    Additionally, the patient will need an updated echocardiogram.   HAS-BLED score 3 Hypertension Yes  Abnormal renal and liver function (Dialysis, transplant, Cr >2.26 mg/dL /Cirrhosis or Bilirubin >2x Normal or AST/ALT/AP >3x Normal) No  Stroke No  Bleeding Yes  Labile INR (Unstable/high INR) No  Elderly (>65) Yes  Drugs or alcohol (>= 8 drinks/week, anti-plt or NSAID) No    CHA2DS2-VASc Score = 5  The patient's score is based upon: CHF History: 0 HTN History: 1 Diabetes History: 1 Stroke History: 0 Vascular Disease History: 1 Age Score: 2 Gender Score: 0    Presents for Watchman today. Procedure reviewed. He has started the anticoagulant without signs of bleeding.   Signed, Rossie Muskrat. Lalla Brothers, MD, [facility], [facility] 03/04/2023 Electrophysiology [facility]

## 2023-03-04 NOTE — Transfer of Care (Signed)
Immediate Anesthesia Transfer of Care Note  Patient: Gerald Martinez  Procedure(s) Performed: LEFT ATRIAL APPENDAGE OCCLUSION TRANSESOPHAGEAL ECHOCARDIOGRAM  Patient Location: PACU and Cath Lab  Anesthesia Type:General  Level of Consciousness: awake  Airway & Oxygen Therapy: Patient Spontanous Breathing  Post-op Assessment: Report given to RN  Post vital signs: Reviewed and stable  Last Vitals:  Vitals Value Taken Time  BP    Temp    Pulse    Resp    SpO2      Last Pain:  Vitals:   03/04/23 0627  TempSrc:   PainSc: 0-No pain         Complications: No notable events documented.

## 2023-03-05 ENCOUNTER — Encounter (HOSPITAL_COMMUNITY): Payer: Self-pay | Admitting: Cardiology

## 2023-03-05 ENCOUNTER — Telehealth: Payer: Self-pay | Admitting: Cardiology

## 2023-03-05 MED FILL — Heparin Sodium (Porcine) Inj 1000 Unit/ML: INTRAMUSCULAR | Qty: 10 | Status: AC

## 2023-03-05 MED FILL — Fentanyl Citrate Preservative Free (PF) Inj 100 MCG/2ML: INTRAMUSCULAR | Qty: 1 | Status: AC

## 2023-03-05 NOTE — Telephone Encounter (Signed)
  [facility]   Watchman Team  Attempted to contact the patient regarding discharge from [facility] on 03/04/2023 after aborted LAAO procedure with no answer or ability to leave voice message. Patient has follow up recall in to see Dr. Antoine Poche 04/2023.   Georgie Chard NP-C Structural Heart Team  Pager: [phone] Phone: [phone]

## 2023-03-08 NOTE — Telephone Encounter (Signed)
TOC call attempt #2.  The phone kept ringing and ringing with no answer or Voicemail option.

## 2023-03-11 NOTE — Anesthesia Postprocedure Evaluation (Signed)
Anesthesia Post Note  Patient: Gerald Martinez  Procedure(s) Performed: LEFT ATRIAL APPENDAGE OCCLUSION TRANSESOPHAGEAL ECHOCARDIOGRAM     Patient location during evaluation: Cath Lab Anesthesia Type: General Level of consciousness: awake and alert Pain management: pain level controlled Vital Signs Assessment: post-procedure vital signs reviewed and stable Respiratory status: spontaneous breathing, nonlabored ventilation and respiratory function stable Cardiovascular status: blood pressure returned to baseline and stable Postop Assessment: no apparent nausea or vomiting Anesthetic complications: no   There were no known notable events for this encounter.               Larrie Lucia

## 2023-03-18 DIAGNOSIS — R350 Frequency of micturition: Secondary | ICD-10-CM | POA: Diagnosis not present

## 2023-03-18 DIAGNOSIS — N472 Paraphimosis: Secondary | ICD-10-CM | POA: Diagnosis not present

## 2023-03-18 DIAGNOSIS — N401 Enlarged prostate with lower urinary tract symptoms: Secondary | ICD-10-CM | POA: Diagnosis not present

## 2023-03-25 ENCOUNTER — Encounter: Payer: Self-pay | Admitting: Cardiology

## 2023-03-29 NOTE — Telephone Encounter (Signed)
Not sure I could mention anything that an ENT hasn't already.

## 2023-04-16 ENCOUNTER — Telehealth: Payer: Self-pay

## 2023-04-16 NOTE — Telephone Encounter (Signed)
The patient reports he is doing well since cancelled Watchman procedure 03/04/2023. Confirmed follow-up with Dr. Antoine Poche 12/20. He was grateful for check-in.

## 2023-04-27 ENCOUNTER — Ambulatory Visit: Payer: Medicare HMO | Admitting: Podiatry

## 2023-04-27 ENCOUNTER — Encounter: Payer: Self-pay | Admitting: Podiatry

## 2023-04-27 DIAGNOSIS — B351 Tinea unguium: Secondary | ICD-10-CM | POA: Diagnosis not present

## 2023-04-27 DIAGNOSIS — M79676 Pain in unspecified toe(s): Secondary | ICD-10-CM

## 2023-04-27 DIAGNOSIS — E1149 Type 2 diabetes mellitus with other diabetic neurological complication: Secondary | ICD-10-CM

## 2023-04-27 DIAGNOSIS — E1140 Type 2 diabetes mellitus with diabetic neuropathy, unspecified: Secondary | ICD-10-CM | POA: Diagnosis not present

## 2023-04-27 NOTE — Progress Notes (Signed)
This patient returns to my office for at risk foot care.  This patient requires this care by a professional since this patient will be at risk due to having diabetes and coagulation defect.. Patient is taking eliquis.   This patient is unable to cut nails himself since the patient cannot reach his nails.These nails are painful walking and wearing shoes.  This patient presents for at risk foot care today.  General Appearance  Alert, conversant and in no acute stress.  Vascular  Dorsalis pedis  are  weakly palpable  bilaterally.  Posterior tibial pulses are absent  B/L. Capillary return is within normal limits  bilaterally. Cold feet Bilaterally. Venous stasis feet/legs  B/L.  Absent digital hair.   Venous congestion feet/toes  B/L.  Neurologic  Senn-Weinstein monofilament wire test diminished   bilaterally. Muscle power within normal limits bilaterally.  Nails Thick disfigured discolored nails with subungual debris  from hallux to fifth toes bilaterally. No evidence of bacterial infection or drainage bilaterally.  Orthopedic  No limitations of motion  feet .  No crepitus or effusions noted.  Hammer toes  B/l.  Skin  normotropic skin with no porokeratosis noted bilaterally.  No signs of infections or ulcers noted.     Onychomycosis  Pain in right toes  Pain in left toes  Consent was obtained for treatment procedures.   Mechanical debridement of nails 1-5  bilaterally performed with a nail nipper.  Filed with dremel without incident. Self treating skin lesion right leg.   Return office visit    3 months                 Told patient to return for periodic foot care and evaluation due to potential at risk complications.   Helane Gunther DPM

## 2023-05-13 NOTE — Progress Notes (Signed)
Cardiology Office Note:   Date:  05/14/2023  ID:  Gerald Martinez, DOB 1933/11/11, MRN [id number] PCP: Gerald Aspen, MD  [facility] Providers Cardiologist:  Rollene Rotunda, MD Electrophysiologist:  Lanier Prude, MD {  History of Present Illness:   Gerald Martinez is a 89 y.o. male who presents for follow up of atrial fibrillation and coronary disease.   He had an aborted Watchman attempt.   who presents for follow up of atrial fibrillation and coronary disease.  He has had recurrent nosebleeds.  He had a message from his primary provider that he had to have his Eliquis discontinued.  He has seen ENT multiple occasions and I cannot find bleeding source to stop this.  Bleeding stops when he stops taking his Eliquis.    Unfortunately, he could not get a Watchman because the TEE images were not adequate to cross the septum.  The patient denies any new symptoms such as chest discomfort, neck or arm discomfort. There has been no new shortness of breath, PND or orthopnea. There have been no reported palpitations, presyncope or syncope.   He gets around with a walker. He still has daily nosebleeds that are significant but he is back no the Eliquis.     ROS: As stated in the HPI and negative for all other systems.  Studies Reviewed:    EKG:   EKG Interpretation Date/Time:  Friday May 14 2023 13:36:17 EST Ventricular Rate:  70 PR Interval:    QRS Duration:  106 QT Interval:  408 QTC Calculation: 440 R Axis:   81  Text Interpretation: Atrial flutter with variable A-V block When compared with ECG of 17-Feb-2023 11:23, No significant change was found Confirmed by Rollene Rotunda ([id number]) on 05/14/2023 2:11:58 PM     Risk Assessment/Calculations:    CHA2DS2-VASc Score = 5   This indicates a 7.2% annual risk of stroke. The patient's score is based upon: CHF History: 0 HTN History: 1 Diabetes History: 1 Stroke History: 0 Vascular Disease History: 1 Age Score:  2 Gender Score: 0    Physical Exam:   VS:  BP (!) 154/60   Pulse 70   Ht 5\' 10"  (1.778 m)   Wt 211 lb (95.7 kg)   SpO2 96%   BMI 30.28 kg/m    Wt Readings from Last 3 Encounters:  05/14/23 211 lb (95.7 kg)  03/04/23 203 lb (92.1 kg)  02/17/23 205 lb (93 kg)     GEN: Well nourished, well developed in no acute distress NECK: No JVD; No carotid bruits CARDIAC: Irregular RR, no murmurs, rubs, gallops RESPIRATORY:  Clear to auscultation without rales, wheezing or rhonchi  ABDOMEN: Soft, non-tender, non-distended EXTREMITIES:  Moderate right greater than left leg edema; No deformity   ASSESSMENT AND PLAN:   ATRIAL FLUTTER:   [name] has a CHA2DS2 - VASc score of 5.   We continue to have long present for now he will continue with his Eliquis but will hold it occasionally if he is having excessive nosebleeds.  He is going to get a second opinion at [facility] by ENT to see if they can find a source and stop that bleeding.  He has good rate control no change in therapy otherwise.  CORONARY ATHEROSCLEROSIS NATIVE CORONARY ARTERY: The patient has no new cardiovascular symptoms.  No change in therapy.    HYPERTENSION -   The blood pressure is at target.  No change in therapy.  Follow up with me in six months.    Signed, Rollene Rotunda, MD

## 2023-05-14 ENCOUNTER — Ambulatory Visit: Payer: Medicare HMO | Attending: Cardiology | Admitting: Cardiology

## 2023-05-14 ENCOUNTER — Encounter: Payer: Self-pay | Admitting: Cardiology

## 2023-05-14 VITALS — BP 154/60 | HR 70 | Ht 70.0 in | Wt 211.0 lb

## 2023-05-14 DIAGNOSIS — I10 Essential (primary) hypertension: Secondary | ICD-10-CM

## 2023-05-14 DIAGNOSIS — I2510 Atherosclerotic heart disease of native coronary artery without angina pectoris: Secondary | ICD-10-CM

## 2023-05-14 DIAGNOSIS — I482 Chronic atrial fibrillation, unspecified: Secondary | ICD-10-CM

## 2023-05-14 NOTE — Patient Instructions (Signed)
Medication Instructions:  No changes *If you need a refill on your cardiac medications before your next appointment, please call your pharmacy*  Follow-Up: At [facility], you and your health needs are our priority.  As part of our continuing mission to provide you with exceptional heart care, we have created designated Provider Care Teams.  These Care Teams include your primary Cardiologist (physician) and Advanced Practice Providers (APPs -  Physician Assistants and Nurse Practitioners) who all work together to provide you with the care you need, when you need it.  We recommend signing up for the patient portal called "MyChart".  Sign up information is provided on this After Visit Summary.  MyChart is used to connect with patients for Virtual Visits (Telemedicine).  Patients are able to view lab/test results, encounter notes, upcoming appointments, etc.  Non-urgent messages can be sent to your provider as well.   To learn more about what you can do with MyChart, go to [email or web address].    Your next appointment:   6 month(s)  Provider:   Minus Breeding, MD

## 2023-05-17 DIAGNOSIS — Z7901 Long term (current) use of anticoagulants: Secondary | ICD-10-CM | POA: Diagnosis not present

## 2023-05-17 DIAGNOSIS — R040 Epistaxis: Secondary | ICD-10-CM | POA: Diagnosis not present

## 2023-06-24 DIAGNOSIS — Z79899 Other long term (current) drug therapy: Secondary | ICD-10-CM | POA: Diagnosis not present

## 2023-06-24 DIAGNOSIS — D649 Anemia, unspecified: Secondary | ICD-10-CM | POA: Diagnosis not present

## 2023-06-24 DIAGNOSIS — R600 Localized edema: Secondary | ICD-10-CM | POA: Diagnosis not present

## 2023-06-24 DIAGNOSIS — R5381 Other malaise: Secondary | ICD-10-CM | POA: Diagnosis not present

## 2023-06-24 DIAGNOSIS — I10 Essential (primary) hypertension: Secondary | ICD-10-CM | POA: Diagnosis not present

## 2023-06-24 DIAGNOSIS — E291 Testicular hypofunction: Secondary | ICD-10-CM | POA: Diagnosis not present

## 2023-06-24 DIAGNOSIS — D6869 Other thrombophilia: Secondary | ICD-10-CM | POA: Diagnosis not present

## 2023-06-24 DIAGNOSIS — I4892 Unspecified atrial flutter: Secondary | ICD-10-CM | POA: Diagnosis not present

## 2023-06-24 DIAGNOSIS — G63 Polyneuropathy in diseases classified elsewhere: Secondary | ICD-10-CM | POA: Diagnosis not present

## 2023-06-24 DIAGNOSIS — F5101 Primary insomnia: Secondary | ICD-10-CM | POA: Diagnosis not present

## 2023-06-24 DIAGNOSIS — I714 Abdominal aortic aneurysm, without rupture, unspecified: Secondary | ICD-10-CM | POA: Diagnosis not present

## 2023-06-24 DIAGNOSIS — Z0000 Encounter for general adult medical examination without abnormal findings: Secondary | ICD-10-CM | POA: Diagnosis not present

## 2023-06-24 DIAGNOSIS — E1159 Type 2 diabetes mellitus with other circulatory complications: Secondary | ICD-10-CM | POA: Diagnosis not present

## 2023-06-24 DIAGNOSIS — L989 Disorder of the skin and subcutaneous tissue, unspecified: Secondary | ICD-10-CM | POA: Diagnosis not present

## 2023-07-19 DIAGNOSIS — I872 Venous insufficiency (chronic) (peripheral): Secondary | ICD-10-CM | POA: Diagnosis not present

## 2023-07-19 DIAGNOSIS — B351 Tinea unguium: Secondary | ICD-10-CM | POA: Diagnosis not present

## 2023-08-24 ENCOUNTER — Ambulatory Visit: Payer: Medicare HMO | Admitting: Podiatry

## 2023-08-30 ENCOUNTER — Ambulatory Visit: Admitting: Podiatry

## 2023-08-30 ENCOUNTER — Encounter: Payer: Self-pay | Admitting: Podiatry

## 2023-08-30 DIAGNOSIS — E1140 Type 2 diabetes mellitus with diabetic neuropathy, unspecified: Secondary | ICD-10-CM

## 2023-08-30 DIAGNOSIS — B351 Tinea unguium: Secondary | ICD-10-CM | POA: Diagnosis not present

## 2023-08-30 DIAGNOSIS — M79676 Pain in unspecified toe(s): Secondary | ICD-10-CM

## 2023-08-30 NOTE — Progress Notes (Signed)
 This patient returns to my office for at risk foot care.  This patient requires this care by a professional since this patient will be at risk due to having diabetes and coagulation defect.. Patient is taking eliquis.   This patient is unable to cut nails himself since the patient cannot reach his nails.These nails are painful walking and wearing shoes.  This patient presents for at risk foot care today.  General Appearance  Alert, conversant and in no acute stress.  Vascular  Dorsalis pedis  are  weakly palpable  bilaterally.  Posterior tibial pulses are absent  B/L. Capillary return is within normal limits  bilaterally. Cold feet Bilaterally. Venous stasis feet/legs  B/L.  Absent digital hair.   Venous congestion feet/toes  B/L.  Neurologic  Senn-Weinstein monofilament wire test diminished   bilaterally. Muscle power within normal limits bilaterally.  Nails Thick disfigured discolored nails with subungual debris  from hallux to fifth toes bilaterally. No evidence of bacterial infection or drainage bilaterally. Subungual bleeding.  Orthopedic  No limitations of motion  feet .  No crepitus or effusions noted.  Hammer toes  B/l.  Skin  normotropic skin with no porokeratosis noted bilaterally.  No signs of infections or ulcers noted.     Onychomycosis  Pain in right toes  Pain in left toes  Consent was obtained for treatment procedures.   Mechanical debridement of nails 1-5  bilaterally performed with a nail nipper.  Filed with dremel without incident. Subungual bleeding noted left hallux. Neosporin/DSD.  Told him to peroxide at home.  Call if condition worsens.   Return office visit    3 months                 Told patient to return for periodic foot care and evaluation due to potential at risk complications.   Helane Gunther DPM

## 2023-09-20 DIAGNOSIS — L0101 Non-bullous impetigo: Secondary | ICD-10-CM | POA: Diagnosis not present

## 2023-09-20 DIAGNOSIS — L821 Other seborrheic keratosis: Secondary | ICD-10-CM | POA: Diagnosis not present

## 2023-09-20 DIAGNOSIS — I872 Venous insufficiency (chronic) (peripheral): Secondary | ICD-10-CM | POA: Diagnosis not present

## 2023-09-20 DIAGNOSIS — L570 Actinic keratosis: Secondary | ICD-10-CM | POA: Diagnosis not present

## 2023-09-20 DIAGNOSIS — D225 Melanocytic nevi of trunk: Secondary | ICD-10-CM | POA: Diagnosis not present

## 2023-09-20 DIAGNOSIS — L814 Other melanin hyperpigmentation: Secondary | ICD-10-CM | POA: Diagnosis not present

## 2023-09-23 DIAGNOSIS — H906 Mixed conductive and sensorineural hearing loss, bilateral: Secondary | ICD-10-CM | POA: Diagnosis not present

## 2023-09-23 DIAGNOSIS — H7291 Unspecified perforation of tympanic membrane, right ear: Secondary | ICD-10-CM | POA: Diagnosis not present

## 2023-09-27 DIAGNOSIS — E291 Testicular hypofunction: Secondary | ICD-10-CM | POA: Diagnosis not present

## 2023-10-19 DIAGNOSIS — H31002 Unspecified chorioretinal scars, left eye: Secondary | ICD-10-CM | POA: Diagnosis not present

## 2023-10-19 DIAGNOSIS — H5203 Hypermetropia, bilateral: Secondary | ICD-10-CM | POA: Diagnosis not present

## 2023-10-19 DIAGNOSIS — H4720 Unspecified optic atrophy: Secondary | ICD-10-CM | POA: Diagnosis not present

## 2023-10-19 DIAGNOSIS — H52203 Unspecified astigmatism, bilateral: Secondary | ICD-10-CM | POA: Diagnosis not present

## 2023-10-19 DIAGNOSIS — E119 Type 2 diabetes mellitus without complications: Secondary | ICD-10-CM | POA: Diagnosis not present

## 2023-10-25 ENCOUNTER — Encounter (HOSPITAL_BASED_OUTPATIENT_CLINIC_OR_DEPARTMENT_OTHER): Attending: Internal Medicine | Admitting: Internal Medicine

## 2023-10-25 DIAGNOSIS — I87321 Chronic venous hypertension (idiopathic) with inflammation of right lower extremity: Secondary | ICD-10-CM | POA: Diagnosis not present

## 2023-10-25 DIAGNOSIS — Z7901 Long term (current) use of anticoagulants: Secondary | ICD-10-CM | POA: Insufficient documentation

## 2023-10-25 DIAGNOSIS — I87323 Chronic venous hypertension (idiopathic) with inflammation of bilateral lower extremity: Secondary | ICD-10-CM | POA: Diagnosis not present

## 2023-10-25 DIAGNOSIS — L308 Other specified dermatitis: Secondary | ICD-10-CM | POA: Diagnosis not present

## 2023-10-25 DIAGNOSIS — S71101A Unspecified open wound, right thigh, initial encounter: Secondary | ICD-10-CM | POA: Diagnosis not present

## 2023-10-25 DIAGNOSIS — L97818 Non-pressure chronic ulcer of other part of right lower leg with other specified severity: Secondary | ICD-10-CM | POA: Insufficient documentation

## 2023-10-25 DIAGNOSIS — L97812 Non-pressure chronic ulcer of other part of right lower leg with fat layer exposed: Secondary | ICD-10-CM | POA: Diagnosis not present

## 2023-10-27 DIAGNOSIS — S81801A Unspecified open wound, right lower leg, initial encounter: Secondary | ICD-10-CM | POA: Diagnosis not present

## 2023-11-01 ENCOUNTER — Encounter (HOSPITAL_BASED_OUTPATIENT_CLINIC_OR_DEPARTMENT_OTHER): Admitting: Internal Medicine

## 2023-11-01 DIAGNOSIS — I87323 Chronic venous hypertension (idiopathic) with inflammation of bilateral lower extremity: Secondary | ICD-10-CM | POA: Diagnosis not present

## 2023-11-01 DIAGNOSIS — L97818 Non-pressure chronic ulcer of other part of right lower leg with other specified severity: Secondary | ICD-10-CM | POA: Diagnosis not present

## 2023-11-01 DIAGNOSIS — L308 Other specified dermatitis: Secondary | ICD-10-CM | POA: Diagnosis not present

## 2023-11-01 DIAGNOSIS — Z7901 Long term (current) use of anticoagulants: Secondary | ICD-10-CM | POA: Diagnosis not present

## 2023-11-01 NOTE — Progress Notes (Unsigned)
  Cardiology Office Note:   Date:  11/01/2023  ID:  MEILECH Gerald Martinez, DOB 1934/03/20, MRN [id number] PCP: Benedetta Bradley, MD  [facility] Providers Cardiologist:  Eilleen Grates, MD Electrophysiologist:  Boyce Byes, MD {  History of Present Illness:   Gerald Martinez is a 89 y.o. male  who presents for follow up of atrial fibrillation and coronary disease.   He had an aborted Watchman attempt.   who presents for follow up of atrial fibrillation and coronary disease.  He has had recurrent nosebleeds.  He had a message from his primary provider that he had to have his Eliquis  discontinued.  He has seen ENT multiple occasions and I cannot find bleeding source to stop this.  Bleeding stops when he stops taking his Eliquis .  Unfortunately, he could not get a Watchman because the TEE images were not adequate to cross the septum.    ***  ***The patient denies any new symptoms such as chest discomfort, neck or arm discomfort. There has been no new shortness of breath, PND or orthopnea. There have been no reported palpitations, presyncope or syncope.   He gets around with a walker. He still has daily nosebleeds that are significant but he is back no the Eliquis .      ROS: ***  Studies Reviewed:    EKG:       ***  Risk Assessment/Calculations:   {Does this patient have ATRIAL FIBRILLATION?:[phone]} No BP recorded.  {Refresh Note OR Click here to enter BP  :1}***        Physical Exam:   VS:  There were no vitals taken for this visit.   Wt Readings from Last 3 Encounters:  05/14/23 211 lb (95.7 kg)  03/04/23 203 lb (92.1 kg)  02/17/23 205 lb (93 kg)     GEN: Well nourished, well developed in no acute distress NECK: No JVD; No carotid bruits CARDIAC: ***RR, *** murmurs, rubs, gallops RESPIRATORY:  Clear to auscultation without rales, wheezing or rhonchi  ABDOMEN: Soft, non-tender, non-distended EXTREMITIES:  No edema; No deformity   ASSESSMENT AND PLAN:   ATRIAL  FLUTTER:   Mr. Gerald Martinez has a CHA2DS2 - VASc score of 5.  ***    We continue to have long present for now he will continue with his Eliquis  but will hold it occasionally if he is having excessive nosebleeds.  He is going to get a second opinion at [facility] by ENT to see if they can find a source and stop that bleeding.  He has good rate control no change in therapy otherwise.   CORONARY ATHEROSCLEROSIS NATIVE CORONARY ARTERY:   ***  he patient has no new cardiovascular symptoms.  No change in therapy.    HYPERTENSION -   The blood pressure is ***  at target.  No change in therapy.     Follow up ***  Signed, Eilleen Grates, MD

## 2023-11-03 ENCOUNTER — Encounter: Payer: Self-pay | Admitting: Cardiology

## 2023-11-03 ENCOUNTER — Ambulatory Visit: Payer: Medicare HMO | Attending: Cardiology | Admitting: Cardiology

## 2023-11-03 VITALS — BP 142/58 | HR 75 | Ht 69.5 in | Wt 204.0 lb

## 2023-11-03 DIAGNOSIS — E785 Hyperlipidemia, unspecified: Secondary | ICD-10-CM | POA: Diagnosis not present

## 2023-11-03 DIAGNOSIS — I10 Essential (primary) hypertension: Secondary | ICD-10-CM

## 2023-11-03 DIAGNOSIS — I2510 Atherosclerotic heart disease of native coronary artery without angina pectoris: Secondary | ICD-10-CM | POA: Diagnosis not present

## 2023-11-03 DIAGNOSIS — I483 Typical atrial flutter: Secondary | ICD-10-CM | POA: Diagnosis not present

## 2023-11-03 NOTE — Patient Instructions (Addendum)
 Medication Instructions:  Your physician recommends that you continue on your current medications as directed. Please refer to the Current Medication list given to you today.  *If you need a refill on your cardiac medications before your next appointment, please call your pharmacy*  Lab Work: Lipid panel today If you have labs (blood work) drawn today and your tests are completely normal, you will receive your results only by: MyChart Message (if you have MyChart) OR A paper copy in the mail If you have any lab test that is abnormal or we need to change your treatment, we will call you to review the results.  Testing/Procedures: NONE  Follow-Up: At [facility], you and your health needs are our priority.  As part of our continuing mission to provide you with exceptional heart care, our providers are all part of one team.  This team includes your primary Cardiologist (physician) and Advanced Practice Providers or APPs (Physician Assistants and Nurse Practitioners) who all work together to provide you with the care you need, when you need it.  Your next appointment:   6 months  Provider:   Eilleen Grates, MD    We recommend signing up for the patient portal called MyChart.  Sign up information is provided on this After Visit Summary.  MyChart is used to connect with patients for Virtual Visits (Telemedicine).  Patients are able to view lab/test results, encounter notes, upcoming appointments, etc.  Non-urgent messages can be sent to your provider as well.   To learn more about what you can do with MyChart, go to [email or web address].

## 2023-11-04 ENCOUNTER — Ambulatory Visit: Payer: Self-pay | Admitting: Cardiology

## 2023-11-04 LAB — LIPID PANEL
Chol/HDL Ratio: 2.8 ratio (ref 0.0–5.0)
Cholesterol, Total: 132 mg/dL (ref 100–199)
HDL: 48 mg/dL (ref >39)
LDL Chol Calc (NIH): 63 mg/dL (ref 0–99)
Triglycerides: 116 mg/dL (ref 0–149)
VLDL Cholesterol Cal: 21 mg/dL (ref 5–40)

## 2023-11-08 ENCOUNTER — Encounter (HOSPITAL_BASED_OUTPATIENT_CLINIC_OR_DEPARTMENT_OTHER): Admitting: Internal Medicine

## 2023-11-08 DIAGNOSIS — I87323 Chronic venous hypertension (idiopathic) with inflammation of bilateral lower extremity: Secondary | ICD-10-CM

## 2023-11-08 DIAGNOSIS — Z7901 Long term (current) use of anticoagulants: Secondary | ICD-10-CM | POA: Diagnosis not present

## 2023-11-08 DIAGNOSIS — L97818 Non-pressure chronic ulcer of other part of right lower leg with other specified severity: Secondary | ICD-10-CM

## 2023-11-08 DIAGNOSIS — L308 Other specified dermatitis: Secondary | ICD-10-CM

## 2023-11-22 ENCOUNTER — Encounter (HOSPITAL_BASED_OUTPATIENT_CLINIC_OR_DEPARTMENT_OTHER): Admitting: Internal Medicine

## 2023-11-22 DIAGNOSIS — Z7901 Long term (current) use of anticoagulants: Secondary | ICD-10-CM | POA: Diagnosis not present

## 2023-11-22 DIAGNOSIS — L308 Other specified dermatitis: Secondary | ICD-10-CM

## 2023-11-22 DIAGNOSIS — I87323 Chronic venous hypertension (idiopathic) with inflammation of bilateral lower extremity: Secondary | ICD-10-CM

## 2023-11-22 DIAGNOSIS — L97818 Non-pressure chronic ulcer of other part of right lower leg with other specified severity: Secondary | ICD-10-CM | POA: Diagnosis not present

## 2023-11-29 ENCOUNTER — Encounter (HOSPITAL_BASED_OUTPATIENT_CLINIC_OR_DEPARTMENT_OTHER): Attending: Internal Medicine | Admitting: Internal Medicine

## 2023-11-29 DIAGNOSIS — L308 Other specified dermatitis: Secondary | ICD-10-CM | POA: Diagnosis not present

## 2023-11-29 DIAGNOSIS — I87323 Chronic venous hypertension (idiopathic) with inflammation of bilateral lower extremity: Secondary | ICD-10-CM | POA: Diagnosis not present

## 2023-11-29 DIAGNOSIS — L97818 Non-pressure chronic ulcer of other part of right lower leg with other specified severity: Secondary | ICD-10-CM | POA: Diagnosis not present

## 2023-11-30 DIAGNOSIS — L97818 Non-pressure chronic ulcer of other part of right lower leg with other specified severity: Secondary | ICD-10-CM | POA: Diagnosis not present

## 2023-11-30 DIAGNOSIS — I87323 Chronic venous hypertension (idiopathic) with inflammation of bilateral lower extremity: Secondary | ICD-10-CM | POA: Diagnosis not present

## 2023-11-30 DIAGNOSIS — S81801A Unspecified open wound, right lower leg, initial encounter: Secondary | ICD-10-CM | POA: Diagnosis not present

## 2023-11-30 DIAGNOSIS — L308 Other specified dermatitis: Secondary | ICD-10-CM | POA: Diagnosis not present

## 2023-12-06 ENCOUNTER — Encounter (HOSPITAL_BASED_OUTPATIENT_CLINIC_OR_DEPARTMENT_OTHER): Admitting: Internal Medicine

## 2023-12-06 DIAGNOSIS — L97818 Non-pressure chronic ulcer of other part of right lower leg with other specified severity: Secondary | ICD-10-CM | POA: Diagnosis not present

## 2023-12-06 DIAGNOSIS — I87323 Chronic venous hypertension (idiopathic) with inflammation of bilateral lower extremity: Secondary | ICD-10-CM | POA: Diagnosis not present

## 2023-12-06 DIAGNOSIS — L308 Other specified dermatitis: Secondary | ICD-10-CM | POA: Diagnosis not present

## 2023-12-16 DIAGNOSIS — S81801A Unspecified open wound, right lower leg, initial encounter: Secondary | ICD-10-CM | POA: Diagnosis not present

## 2023-12-20 ENCOUNTER — Encounter (HOSPITAL_BASED_OUTPATIENT_CLINIC_OR_DEPARTMENT_OTHER): Admitting: Internal Medicine

## 2023-12-22 ENCOUNTER — Encounter (HOSPITAL_BASED_OUTPATIENT_CLINIC_OR_DEPARTMENT_OTHER): Admitting: Internal Medicine

## 2023-12-22 DIAGNOSIS — L97818 Non-pressure chronic ulcer of other part of right lower leg with other specified severity: Secondary | ICD-10-CM | POA: Diagnosis not present

## 2023-12-22 DIAGNOSIS — I87323 Chronic venous hypertension (idiopathic) with inflammation of bilateral lower extremity: Secondary | ICD-10-CM | POA: Diagnosis not present

## 2023-12-22 DIAGNOSIS — L97812 Non-pressure chronic ulcer of other part of right lower leg with fat layer exposed: Secondary | ICD-10-CM | POA: Diagnosis not present

## 2023-12-22 DIAGNOSIS — I87331 Chronic venous hypertension (idiopathic) with ulcer and inflammation of right lower extremity: Secondary | ICD-10-CM | POA: Diagnosis not present

## 2023-12-22 DIAGNOSIS — L308 Other specified dermatitis: Secondary | ICD-10-CM | POA: Diagnosis not present

## 2024-01-03 ENCOUNTER — Encounter (HOSPITAL_BASED_OUTPATIENT_CLINIC_OR_DEPARTMENT_OTHER): Attending: Internal Medicine | Admitting: Internal Medicine

## 2024-01-03 DIAGNOSIS — L308 Other specified dermatitis: Secondary | ICD-10-CM | POA: Diagnosis not present

## 2024-01-03 DIAGNOSIS — I87323 Chronic venous hypertension (idiopathic) with inflammation of bilateral lower extremity: Secondary | ICD-10-CM | POA: Insufficient documentation

## 2024-01-03 DIAGNOSIS — I87331 Chronic venous hypertension (idiopathic) with ulcer and inflammation of right lower extremity: Secondary | ICD-10-CM | POA: Diagnosis not present

## 2024-01-03 DIAGNOSIS — L97818 Non-pressure chronic ulcer of other part of right lower leg with other specified severity: Secondary | ICD-10-CM | POA: Insufficient documentation

## 2024-01-03 DIAGNOSIS — L97812 Non-pressure chronic ulcer of other part of right lower leg with fat layer exposed: Secondary | ICD-10-CM | POA: Diagnosis not present

## 2024-01-17 ENCOUNTER — Encounter (HOSPITAL_BASED_OUTPATIENT_CLINIC_OR_DEPARTMENT_OTHER): Admitting: Internal Medicine

## 2024-01-17 DIAGNOSIS — L308 Other specified dermatitis: Secondary | ICD-10-CM | POA: Diagnosis not present

## 2024-01-17 DIAGNOSIS — I87323 Chronic venous hypertension (idiopathic) with inflammation of bilateral lower extremity: Secondary | ICD-10-CM | POA: Diagnosis not present

## 2024-01-17 DIAGNOSIS — L97818 Non-pressure chronic ulcer of other part of right lower leg with other specified severity: Secondary | ICD-10-CM

## 2024-01-19 DIAGNOSIS — S81801A Unspecified open wound, right lower leg, initial encounter: Secondary | ICD-10-CM | POA: Diagnosis not present

## 2024-01-19 DIAGNOSIS — L308 Other specified dermatitis: Secondary | ICD-10-CM | POA: Diagnosis not present

## 2024-01-19 DIAGNOSIS — I87323 Chronic venous hypertension (idiopathic) with inflammation of bilateral lower extremity: Secondary | ICD-10-CM | POA: Diagnosis not present

## 2024-01-19 DIAGNOSIS — L97818 Non-pressure chronic ulcer of other part of right lower leg with other specified severity: Secondary | ICD-10-CM | POA: Diagnosis not present

## 2024-01-31 ENCOUNTER — Encounter (HOSPITAL_BASED_OUTPATIENT_CLINIC_OR_DEPARTMENT_OTHER): Attending: Internal Medicine | Admitting: Internal Medicine

## 2024-01-31 DIAGNOSIS — Z87891 Personal history of nicotine dependence: Secondary | ICD-10-CM | POA: Diagnosis not present

## 2024-01-31 DIAGNOSIS — I87323 Chronic venous hypertension (idiopathic) with inflammation of bilateral lower extremity: Secondary | ICD-10-CM | POA: Insufficient documentation

## 2024-01-31 DIAGNOSIS — I482 Chronic atrial fibrillation, unspecified: Secondary | ICD-10-CM | POA: Diagnosis not present

## 2024-01-31 DIAGNOSIS — Z7901 Long term (current) use of anticoagulants: Secondary | ICD-10-CM | POA: Diagnosis not present

## 2024-01-31 DIAGNOSIS — L97818 Non-pressure chronic ulcer of other part of right lower leg with other specified severity: Secondary | ICD-10-CM | POA: Insufficient documentation

## 2024-01-31 DIAGNOSIS — Z951 Presence of aortocoronary bypass graft: Secondary | ICD-10-CM | POA: Insufficient documentation

## 2024-01-31 DIAGNOSIS — Z7984 Long term (current) use of oral hypoglycemic drugs: Secondary | ICD-10-CM | POA: Insufficient documentation

## 2024-01-31 DIAGNOSIS — L308 Other specified dermatitis: Secondary | ICD-10-CM | POA: Diagnosis not present

## 2024-01-31 DIAGNOSIS — E11622 Type 2 diabetes mellitus with other skin ulcer: Secondary | ICD-10-CM | POA: Insufficient documentation

## 2024-02-10 DIAGNOSIS — S81801A Unspecified open wound, right lower leg, initial encounter: Secondary | ICD-10-CM | POA: Diagnosis not present

## 2024-02-14 ENCOUNTER — Encounter (HOSPITAL_BASED_OUTPATIENT_CLINIC_OR_DEPARTMENT_OTHER): Admitting: Internal Medicine

## 2024-02-14 DIAGNOSIS — Z951 Presence of aortocoronary bypass graft: Secondary | ICD-10-CM | POA: Diagnosis not present

## 2024-02-14 DIAGNOSIS — L97818 Non-pressure chronic ulcer of other part of right lower leg with other specified severity: Secondary | ICD-10-CM

## 2024-02-14 DIAGNOSIS — I482 Chronic atrial fibrillation, unspecified: Secondary | ICD-10-CM | POA: Diagnosis not present

## 2024-02-14 DIAGNOSIS — L308 Other specified dermatitis: Secondary | ICD-10-CM | POA: Diagnosis not present

## 2024-02-14 DIAGNOSIS — Z7984 Long term (current) use of oral hypoglycemic drugs: Secondary | ICD-10-CM | POA: Diagnosis not present

## 2024-02-14 DIAGNOSIS — L03115 Cellulitis of right lower limb: Secondary | ICD-10-CM | POA: Diagnosis not present

## 2024-02-14 DIAGNOSIS — I87323 Chronic venous hypertension (idiopathic) with inflammation of bilateral lower extremity: Secondary | ICD-10-CM | POA: Diagnosis not present

## 2024-02-14 DIAGNOSIS — Z7901 Long term (current) use of anticoagulants: Secondary | ICD-10-CM | POA: Diagnosis not present

## 2024-02-14 DIAGNOSIS — E11622 Type 2 diabetes mellitus with other skin ulcer: Secondary | ICD-10-CM | POA: Diagnosis not present

## 2024-02-14 DIAGNOSIS — Z87891 Personal history of nicotine dependence: Secondary | ICD-10-CM | POA: Diagnosis not present

## 2024-02-16 DIAGNOSIS — I87323 Chronic venous hypertension (idiopathic) with inflammation of bilateral lower extremity: Secondary | ICD-10-CM | POA: Diagnosis not present

## 2024-02-16 DIAGNOSIS — L308 Other specified dermatitis: Secondary | ICD-10-CM | POA: Diagnosis not present

## 2024-02-16 DIAGNOSIS — L97818 Non-pressure chronic ulcer of other part of right lower leg with other specified severity: Secondary | ICD-10-CM | POA: Diagnosis not present

## 2024-02-16 DIAGNOSIS — S81801A Unspecified open wound, right lower leg, initial encounter: Secondary | ICD-10-CM | POA: Diagnosis not present

## 2024-02-21 ENCOUNTER — Encounter (HOSPITAL_BASED_OUTPATIENT_CLINIC_OR_DEPARTMENT_OTHER): Admitting: Internal Medicine

## 2024-02-21 DIAGNOSIS — S81801D Unspecified open wound, right lower leg, subsequent encounter: Secondary | ICD-10-CM

## 2024-02-21 DIAGNOSIS — I87323 Chronic venous hypertension (idiopathic) with inflammation of bilateral lower extremity: Secondary | ICD-10-CM

## 2024-02-21 DIAGNOSIS — Z7901 Long term (current) use of anticoagulants: Secondary | ICD-10-CM | POA: Diagnosis not present

## 2024-02-21 DIAGNOSIS — L97818 Non-pressure chronic ulcer of other part of right lower leg with other specified severity: Secondary | ICD-10-CM | POA: Diagnosis not present

## 2024-02-29 ENCOUNTER — Ambulatory Visit (HOSPITAL_BASED_OUTPATIENT_CLINIC_OR_DEPARTMENT_OTHER): Admitting: General Surgery

## 2024-03-06 ENCOUNTER — Encounter (HOSPITAL_BASED_OUTPATIENT_CLINIC_OR_DEPARTMENT_OTHER): Attending: Internal Medicine | Admitting: Internal Medicine

## 2024-03-06 DIAGNOSIS — B372 Candidiasis of skin and nail: Secondary | ICD-10-CM | POA: Insufficient documentation

## 2024-03-06 DIAGNOSIS — S81801D Unspecified open wound, right lower leg, subsequent encounter: Secondary | ICD-10-CM | POA: Diagnosis not present

## 2024-03-06 DIAGNOSIS — L03115 Cellulitis of right lower limb: Secondary | ICD-10-CM | POA: Diagnosis not present

## 2024-03-06 DIAGNOSIS — L308 Other specified dermatitis: Secondary | ICD-10-CM | POA: Diagnosis not present

## 2024-03-06 DIAGNOSIS — L97818 Non-pressure chronic ulcer of other part of right lower leg with other specified severity: Secondary | ICD-10-CM | POA: Insufficient documentation

## 2024-03-06 DIAGNOSIS — I87323 Chronic venous hypertension (idiopathic) with inflammation of bilateral lower extremity: Secondary | ICD-10-CM | POA: Diagnosis not present

## 2024-03-07 DIAGNOSIS — S81801A Unspecified open wound, right lower leg, initial encounter: Secondary | ICD-10-CM | POA: Diagnosis not present

## 2024-03-13 DIAGNOSIS — D1801 Hemangioma of skin and subcutaneous tissue: Secondary | ICD-10-CM | POA: Diagnosis not present

## 2024-03-13 DIAGNOSIS — Z85828 Personal history of other malignant neoplasm of skin: Secondary | ICD-10-CM | POA: Diagnosis not present

## 2024-03-13 DIAGNOSIS — L821 Other seborrheic keratosis: Secondary | ICD-10-CM | POA: Diagnosis not present

## 2024-03-13 DIAGNOSIS — Z08 Encounter for follow-up examination after completed treatment for malignant neoplasm: Secondary | ICD-10-CM | POA: Diagnosis not present

## 2024-03-13 DIAGNOSIS — I872 Venous insufficiency (chronic) (peripheral): Secondary | ICD-10-CM | POA: Diagnosis not present

## 2024-03-13 DIAGNOSIS — L814 Other melanin hyperpigmentation: Secondary | ICD-10-CM | POA: Diagnosis not present

## 2024-03-14 ENCOUNTER — Encounter: Payer: Self-pay | Admitting: Podiatry

## 2024-03-14 ENCOUNTER — Ambulatory Visit (INDEPENDENT_AMBULATORY_CARE_PROVIDER_SITE_OTHER): Admitting: Podiatry

## 2024-03-14 DIAGNOSIS — M79676 Pain in unspecified toe(s): Secondary | ICD-10-CM

## 2024-03-14 DIAGNOSIS — E1140 Type 2 diabetes mellitus with diabetic neuropathy, unspecified: Secondary | ICD-10-CM

## 2024-03-14 DIAGNOSIS — B351 Tinea unguium: Secondary | ICD-10-CM

## 2024-03-14 NOTE — Progress Notes (Signed)
 This patient returns to my office for at risk foot care.  This patient requires this care by a professional since this patient will be at risk due to having diabetes and coagulation defect.. Patient is taking eliquis.   This patient is unable to cut nails himself since the patient cannot reach his nails.These nails are painful walking and wearing shoes.  This patient presents for at risk foot care today.  General Appearance  Alert, conversant and in no acute stress.  Vascular  Dorsalis pedis  are  weakly palpable  bilaterally.  Posterior tibial pulses are absent  B/L. Capillary return is within normal limits  bilaterally. Cold feet Bilaterally. Venous stasis feet/legs  B/L.  Absent digital hair.   Venous congestion feet/toes  B/L.  Neurologic  Senn-Weinstein monofilament wire test diminished   bilaterally. Muscle power within normal limits bilaterally.  Nails Thick disfigured discolored nails with subungual debris  from hallux to fifth toes bilaterally. No evidence of bacterial infection or drainage bilaterally. Subungual bleeding.  Orthopedic  No limitations of motion  feet .  No crepitus or effusions noted.  Hammer toes  B/l.  Skin  normotropic skin with no porokeratosis noted bilaterally.  No signs of infections or ulcers noted.     Onychomycosis  Pain in right toes  Pain in left toes  Consent was obtained for treatment procedures.   Mechanical debridement of nails 1-5  bilaterally performed with a nail nipper.  Filed with dremel without incident. Subungual bleeding noted left hallux. Neosporin/DSD.  Told him to peroxide at home.  Call if condition worsens.   Return office visit    3 months                 Told patient to return for periodic foot care and evaluation due to potential at risk complications.   Helane Gunther DPM

## 2024-03-16 DIAGNOSIS — S81801A Unspecified open wound, right lower leg, initial encounter: Secondary | ICD-10-CM | POA: Diagnosis not present

## 2024-03-20 ENCOUNTER — Encounter (HOSPITAL_BASED_OUTPATIENT_CLINIC_OR_DEPARTMENT_OTHER): Admitting: Internal Medicine

## 2024-03-20 DIAGNOSIS — L03115 Cellulitis of right lower limb: Secondary | ICD-10-CM | POA: Diagnosis not present

## 2024-03-20 DIAGNOSIS — B372 Candidiasis of skin and nail: Secondary | ICD-10-CM | POA: Diagnosis not present

## 2024-03-20 DIAGNOSIS — I87323 Chronic venous hypertension (idiopathic) with inflammation of bilateral lower extremity: Secondary | ICD-10-CM

## 2024-03-20 DIAGNOSIS — L308 Other specified dermatitis: Secondary | ICD-10-CM | POA: Diagnosis not present

## 2024-03-20 DIAGNOSIS — S81801D Unspecified open wound, right lower leg, subsequent encounter: Secondary | ICD-10-CM

## 2024-03-20 DIAGNOSIS — L97818 Non-pressure chronic ulcer of other part of right lower leg with other specified severity: Secondary | ICD-10-CM

## 2024-03-21 DIAGNOSIS — I87323 Chronic venous hypertension (idiopathic) with inflammation of bilateral lower extremity: Secondary | ICD-10-CM | POA: Diagnosis not present

## 2024-03-21 DIAGNOSIS — L308 Other specified dermatitis: Secondary | ICD-10-CM | POA: Diagnosis not present

## 2024-03-21 DIAGNOSIS — L97818 Non-pressure chronic ulcer of other part of right lower leg with other specified severity: Secondary | ICD-10-CM | POA: Diagnosis not present

## 2024-04-03 ENCOUNTER — Encounter (HOSPITAL_BASED_OUTPATIENT_CLINIC_OR_DEPARTMENT_OTHER): Attending: General Surgery | Admitting: Internal Medicine

## 2024-04-03 DIAGNOSIS — L97818 Non-pressure chronic ulcer of other part of right lower leg with other specified severity: Secondary | ICD-10-CM

## 2024-04-03 DIAGNOSIS — S81801A Unspecified open wound, right lower leg, initial encounter: Secondary | ICD-10-CM | POA: Diagnosis not present

## 2024-04-03 DIAGNOSIS — I482 Chronic atrial fibrillation, unspecified: Secondary | ICD-10-CM | POA: Diagnosis not present

## 2024-04-03 DIAGNOSIS — Z951 Presence of aortocoronary bypass graft: Secondary | ICD-10-CM | POA: Diagnosis not present

## 2024-04-03 DIAGNOSIS — I87331 Chronic venous hypertension (idiopathic) with ulcer and inflammation of right lower extremity: Secondary | ICD-10-CM | POA: Diagnosis not present

## 2024-04-03 DIAGNOSIS — Z7901 Long term (current) use of anticoagulants: Secondary | ICD-10-CM | POA: Diagnosis not present

## 2024-04-03 DIAGNOSIS — I87323 Chronic venous hypertension (idiopathic) with inflammation of bilateral lower extremity: Secondary | ICD-10-CM | POA: Diagnosis not present

## 2024-04-17 ENCOUNTER — Encounter (HOSPITAL_BASED_OUTPATIENT_CLINIC_OR_DEPARTMENT_OTHER): Admitting: Internal Medicine

## 2024-07-26 ENCOUNTER — Ambulatory Visit: Admitting: Podiatry
